# Patient Record
Sex: Female | Born: 1935 | Race: Black or African American | Hispanic: No | Smoking: Never smoker
Health system: Southern US, Community
[De-identification: ages and names within clinical notes are randomized; demographics above are authoritative.]

## PROBLEM LIST (undated history)

## (undated) DIAGNOSIS — R0600 Dyspnea, unspecified: Secondary | ICD-10-CM

## (undated) DIAGNOSIS — E785 Hyperlipidemia, unspecified: Secondary | ICD-10-CM

## (undated) DIAGNOSIS — I10 Essential (primary) hypertension: Secondary | ICD-10-CM

## (undated) DIAGNOSIS — I350 Nonrheumatic aortic (valve) stenosis: Secondary | ICD-10-CM

## (undated) DIAGNOSIS — I509 Heart failure, unspecified: Secondary | ICD-10-CM

## (undated) DIAGNOSIS — I4891 Unspecified atrial fibrillation: Secondary | ICD-10-CM

## (undated) DIAGNOSIS — Z0000 Encounter for general adult medical examination without abnormal findings: Secondary | ICD-10-CM

## (undated) HISTORY — DX: Essential (primary) hypertension: I10

## (undated) HISTORY — DX: Nonrheumatic aortic (valve) stenosis: I35.0

## (undated) HISTORY — DX: Encounter for general adult medical examination without abnormal findings: Z00.00

## (undated) HISTORY — DX: Hyperlipidemia, unspecified: E78.5

## (undated) HISTORY — DX: Unspecified atrial fibrillation: I48.91

---

## 1997-06-20 ENCOUNTER — Encounter: Admission: RE | Admit: 1997-06-20 | Discharge: 1997-06-20 | Payer: Self-pay | Admitting: Internal Medicine

## 1997-09-16 ENCOUNTER — Encounter: Admission: RE | Admit: 1997-09-16 | Discharge: 1997-09-16 | Payer: Self-pay | Admitting: Internal Medicine

## 1998-01-12 ENCOUNTER — Encounter: Admission: RE | Admit: 1998-01-12 | Discharge: 1998-01-12 | Payer: Self-pay | Admitting: Internal Medicine

## 1998-07-23 ENCOUNTER — Encounter: Admission: RE | Admit: 1998-07-23 | Discharge: 1998-07-23 | Payer: Self-pay | Admitting: Internal Medicine

## 1999-07-12 ENCOUNTER — Encounter: Admission: RE | Admit: 1999-07-12 | Discharge: 1999-07-12 | Payer: Self-pay | Admitting: Internal Medicine

## 2000-04-17 ENCOUNTER — Encounter: Admission: RE | Admit: 2000-04-17 | Discharge: 2000-04-17 | Payer: Self-pay | Admitting: Internal Medicine

## 2001-07-17 ENCOUNTER — Encounter: Admission: RE | Admit: 2001-07-17 | Discharge: 2001-07-17 | Payer: Self-pay | Admitting: *Deleted

## 2002-06-27 ENCOUNTER — Encounter: Admission: RE | Admit: 2002-06-27 | Discharge: 2002-06-27 | Payer: Self-pay | Admitting: Internal Medicine

## 2002-07-09 ENCOUNTER — Encounter: Admission: RE | Admit: 2002-07-09 | Discharge: 2002-07-09 | Payer: Self-pay | Admitting: Internal Medicine

## 2002-10-10 ENCOUNTER — Encounter: Admission: RE | Admit: 2002-10-10 | Discharge: 2002-10-10 | Payer: Self-pay | Admitting: Internal Medicine

## 2003-09-17 ENCOUNTER — Encounter: Admission: RE | Admit: 2003-09-17 | Discharge: 2003-09-17 | Payer: Self-pay | Admitting: Internal Medicine

## 2003-11-21 ENCOUNTER — Ambulatory Visit: Payer: Self-pay | Admitting: Internal Medicine

## 2004-08-20 ENCOUNTER — Ambulatory Visit: Payer: Self-pay | Admitting: Internal Medicine

## 2004-09-03 ENCOUNTER — Ambulatory Visit: Payer: Self-pay | Admitting: Internal Medicine

## 2004-09-10 ENCOUNTER — Ambulatory Visit: Payer: Self-pay | Admitting: Internal Medicine

## 2006-07-11 ENCOUNTER — Emergency Department (HOSPITAL_COMMUNITY): Admission: EM | Admit: 2006-07-11 | Discharge: 2006-07-11 | Payer: Self-pay | Admitting: Emergency Medicine

## 2006-07-18 ENCOUNTER — Ambulatory Visit: Payer: Self-pay | Admitting: Internal Medicine

## 2006-07-18 ENCOUNTER — Encounter (INDEPENDENT_AMBULATORY_CARE_PROVIDER_SITE_OTHER): Payer: Self-pay | Admitting: Dermatology

## 2006-07-18 DIAGNOSIS — I119 Hypertensive heart disease without heart failure: Secondary | ICD-10-CM | POA: Insufficient documentation

## 2006-07-26 ENCOUNTER — Encounter (INDEPENDENT_AMBULATORY_CARE_PROVIDER_SITE_OTHER): Payer: Self-pay | Admitting: Dermatology

## 2006-07-26 ENCOUNTER — Ambulatory Visit: Payer: Self-pay | Admitting: Internal Medicine

## 2006-07-26 DIAGNOSIS — B0229 Other postherpetic nervous system involvement: Secondary | ICD-10-CM

## 2006-08-02 ENCOUNTER — Ambulatory Visit: Payer: Self-pay | Admitting: Internal Medicine

## 2006-08-07 ENCOUNTER — Encounter (INDEPENDENT_AMBULATORY_CARE_PROVIDER_SITE_OTHER): Payer: Self-pay | Admitting: Dermatology

## 2006-08-17 ENCOUNTER — Encounter (INDEPENDENT_AMBULATORY_CARE_PROVIDER_SITE_OTHER): Payer: Self-pay | Admitting: Internal Medicine

## 2006-08-17 ENCOUNTER — Ambulatory Visit: Payer: Self-pay | Admitting: Internal Medicine

## 2007-02-20 ENCOUNTER — Telehealth (INDEPENDENT_AMBULATORY_CARE_PROVIDER_SITE_OTHER): Payer: Self-pay | Admitting: *Deleted

## 2007-02-22 ENCOUNTER — Encounter (INDEPENDENT_AMBULATORY_CARE_PROVIDER_SITE_OTHER): Payer: Self-pay | Admitting: *Deleted

## 2007-02-22 ENCOUNTER — Ambulatory Visit: Payer: Self-pay | Admitting: Internal Medicine

## 2007-02-22 LAB — CONVERTED CEMR LAB
ALT: 18 units/L (ref 0–35)
AST: 21 units/L (ref 0–37)
Albumin: 4.1 g/dL (ref 3.5–5.2)
Alkaline Phosphatase: 62 units/L (ref 39–117)
Calcium: 9.2 mg/dL (ref 8.4–10.5)
Chloride: 102 meq/L (ref 96–112)
Potassium: 4.3 meq/L (ref 3.5–5.3)

## 2007-03-08 ENCOUNTER — Ambulatory Visit: Payer: Self-pay | Admitting: Infectious Disease

## 2007-03-08 DIAGNOSIS — I10 Essential (primary) hypertension: Secondary | ICD-10-CM | POA: Insufficient documentation

## 2007-03-13 ENCOUNTER — Encounter (INDEPENDENT_AMBULATORY_CARE_PROVIDER_SITE_OTHER): Payer: Self-pay | Admitting: *Deleted

## 2007-11-06 ENCOUNTER — Telehealth: Payer: Self-pay | Admitting: *Deleted

## 2008-01-22 ENCOUNTER — Ambulatory Visit: Payer: Self-pay | Admitting: *Deleted

## 2008-01-22 LAB — CONVERTED CEMR LAB
ALT: 18 units/L (ref 0–35)
AST: 19 units/L (ref 0–37)
BUN: 23 mg/dL (ref 6–23)
CO2: 24 meq/L (ref 19–32)
Calcium: 9.3 mg/dL (ref 8.4–10.5)
Chloride: 99 meq/L (ref 96–112)
Cholesterol: 248 mg/dL — ABNORMAL HIGH (ref 0–200)
Creatinine, Ser: 0.87 mg/dL (ref 0.40–1.20)
HDL: 76 mg/dL (ref >39)
Total Bilirubin: 0.4 mg/dL (ref 0.3–1.2)
Total CHOL/HDL Ratio: 3.3
VLDL: 14 mg/dL (ref 0–40)

## 2008-12-05 ENCOUNTER — Telehealth (INDEPENDENT_AMBULATORY_CARE_PROVIDER_SITE_OTHER): Payer: Self-pay | Admitting: *Deleted

## 2009-02-03 ENCOUNTER — Telehealth (INDEPENDENT_AMBULATORY_CARE_PROVIDER_SITE_OTHER): Payer: Self-pay | Admitting: *Deleted

## 2011-10-12 ENCOUNTER — Ambulatory Visit (INDEPENDENT_AMBULATORY_CARE_PROVIDER_SITE_OTHER): Payer: Medicare Other | Admitting: Internal Medicine

## 2011-10-12 ENCOUNTER — Encounter: Payer: Self-pay | Admitting: Internal Medicine

## 2011-10-12 VITALS — BP 129/76 | HR 86 | Temp 96.6°F | Ht 63.0 in | Wt 155.1 lb

## 2011-10-12 DIAGNOSIS — R634 Abnormal weight loss: Secondary | ICD-10-CM

## 2011-10-12 DIAGNOSIS — N939 Abnormal uterine and vaginal bleeding, unspecified: Secondary | ICD-10-CM

## 2011-10-12 DIAGNOSIS — N898 Other specified noninflammatory disorders of vagina: Secondary | ICD-10-CM

## 2011-10-12 NOTE — Patient Instructions (Addendum)
1.  Keep an eye out for the bleeding.  If it comes back please call us back so we can see you while you are bleeding to try to find the source.  2.  Follow up with Dr. Katrinka Blazing to see if he thinks you need to go back on the coumadin.  3.   Continue your medications as prescribed

## 2011-10-12 NOTE — Progress Notes (Signed)
Subjective:   Patient ID: Jean Moore female   DOB: 11-Jul-1926 78 y.o.   MRN: [id number]  HPI: JeanJean Moore is a 78 y.o. woman who was referred to our office by Dr. Verdis Prime for abnormal vaginal bleeding.   She states that she noted vaginal bleeding that started 8/29 and stopped 9/3.  She states that it was like when she used to have a menstrual cycle.  She states that she was going through 2 pads per day.  She denies pain, vaginal discharge, constipation, lightheadedness, palpitations, shortness of breath, or abdominal pain.  She has been on coumadin for over a year for atrial fibrillation.  She states that she noted the blood in the bowl of the toilet and had occasional clots.    She states that has gone down "a few sizes" in her clothes.  She used to be a size 18 and is not down to a size 12 or 14.  She notes that this is over the last year.  In chart review she haslost 50 lbs since she was seen in the Epic system in 2010.  She denies night sweats, nausea, vomiting, lymphadenopathy, constipation, diarrhea, stool changes, abdominal pain, or chest pain.  She is a never smoker.    Past Medical History  Diagnosis Date  . Atrial fibrillation     on coumadin since 2012, recently stopped because of vaginal bleeding  . Hypertension   . Hyperlipidemia    Current Outpatient Prescriptions  Medication Sig Dispense Refill  . digoxin (LANOXIN) 0.125 MG tablet Take 0.125 mg by mouth daily.      . metoprolol succinate (TOPROL-XL) 100 MG 24 hr tablet Take 100 mg by mouth daily. Take with or immediately following a meal.      . Multiple Vitamins-Minerals (CENTRUM SILVER ADULT 50+ PO) Take 1 tablet by mouth daily.       No family history on file. History   Social History  . Marital Status: Widowed    Spouse Name: N/A    Number of Children: N/A  . Years of Education: N/A   Social History Main Topics  . Smoking status: Never Smoker   . Smokeless tobacco: None  . Alcohol Use: None  . Drug  Use: None  . Sexually Active: None   Other Topics Concern  . None   Social History Narrative  . None   Review of Systems: Constitutional: Denies fever, chills, diaphoresis, appetite change and fatigue.  HEENT: Denies photophobia, eye pain, redness, hearing loss, ear pain, congestion, sore throat, rhinorrhea, sneezing, mouth sores, trouble swallowing, neck pain, neck stiffness and tinnitus.   Respiratory: Denies SOB, DOE, cough, chest tightness,  and wheezing.   Cardiovascular: Denies chest pain, palpitations and leg swelling.  Gastrointestinal: Denies nausea, vomiting, abdominal pain, diarrhea, constipation, blood in stool and abdominal distention.  Genitourinary: Positive for vaginal bleeding.  Denies dysuria, urgency, frequency, hematuria, flank pain and difficulty urinating.  Musculoskeletal: Denies myalgias, back pain, joint swelling, arthralgias and gait problem.  Skin: Denies pallor, rash and wound.  Neurological: Denies dizziness, seizures, syncope, weakness, light-headedness, numbness and headaches.  Hematological: Denies adenopathy. Easy bruising, personal or family bleeding history  Psychiatric/Behavioral: Denies suicidal ideation, mood changes, confusion, nervousness, sleep disturbance and agitation  Objective:  Physical Exam: Filed Vitals:   10/12/11 1350  BP: 129/76  Pulse: 86  Temp: 96.6 F (35.9 C)  TempSrc: Oral  Height: 5\' 3"  (1.6 m)  Weight: 155 lb 1.6 oz (70.353 kg)  SpO2: 99%  Constitutional: Vital signs reviewed.  Patient is a thin elderly woman in no acute distress and cooperative with exam. Alert and oriented x3.  Head: Normocephalic and atraumatic Ear: TM normal bilaterally Mouth: no erythema or exudates, MMM Eyes: PERRL, EOMI, conjunctivae normal, No scleral icterus.  Neck: Supple, Trachea midline normal ROM, No JVD, mass, thyromegaly, or carotid bruit present.  Cardiovascular: irregularly irregular rate and rhythm.  S1 normal, S2 normal, no MRG,  pulses symmetric and intact bilaterally Pulmonary/Chest: CTAB, no wheezes, rales, or rhonchi Abdominal: Soft. Non-tender, non-distended, bowel sounds are normal, no masses, organomegaly, or guarding present.  GU: no CVA tenderness, no axillary, inguinal, or neck lymphadenopathy. Vaginal and rectal exam refused. Musculoskeletal: No joint deformities, erythema, or stiffness, ROM full and no nontender Hematology: no cervical, inginal, or axillary adenopathy.  Neurological: A&O x3, Strength is normal and symmetric bilaterally, cranial nerve II-XII are grossly intact, no focal motor deficit, sensory intact to light touch bilaterally.  Skin: Warm, dry and intact. No rash, cyanosis, or clubbing.  Psychiatric: Normal mood and affect. speech and behavior is normal. Judgment and thought content normal. Cognition and memory are normal.   Assessment & Plan:

## 2012-01-02 DIAGNOSIS — N939 Abnormal uterine and vaginal bleeding, unspecified: Secondary | ICD-10-CM | POA: Insufficient documentation

## 2012-01-02 DIAGNOSIS — R634 Abnormal weight loss: Secondary | ICD-10-CM | POA: Insufficient documentation

## 2012-01-02 NOTE — Assessment & Plan Note (Signed)
Jean Moore presents complaining of abnormal vaginal bleeding.  She has been menopausal for at least 30 years and states that this started out of the blue.  She has no symptoms of anemia from the bleeding.  There is, obviously great concern for carcinoma of the uterus, cervix, or vagina.  With her description of the blood in the bowl it may even be coming from her rectum.  When I discussed my concerns with her she declined both an vaginal exam as well as a rectal exam for blood.  We discussed that this may be a warning sign of something more ominous but she continued to refuse.  We discussed the risks including continued symptomatic anemia, spread of cancer, or even death as well as the benefits including detection of occult malignancy, diagnosis of a benign disease, and limiting the possibility of symptomatic anemia.  She states that she understands and will return if the symptoms recur.

## 2012-01-02 NOTE — Assessment & Plan Note (Signed)
Wt Readings from Last 3 Encounters:  10/12/11 155 lb 1.6 oz (70.353 kg)  01/22/08 202 lb (91.627 kg)  03/08/07 202 lb 11.2 oz (91.944 kg)   She has a documented 50 lb weight loss over the last 4 years in the Epic system.  She states that over the last year she has noted that her weight has dropped considerably and her clothing size has shrunk.  She is currently refusing all work up for this weight loss.  This coupled with her vaginal bleeding raise great concern for occult malignancy.  She states that she will return in the symptoms recur.  I encouraged her to keep a food diary and track her weight more carefully to see if she continues to lose weight.

## 2012-03-05 ENCOUNTER — Encounter: Payer: Self-pay | Admitting: Internal Medicine

## 2012-04-09 ENCOUNTER — Encounter: Payer: Self-pay | Admitting: Internal Medicine

## 2012-04-09 ENCOUNTER — Ambulatory Visit (INDEPENDENT_AMBULATORY_CARE_PROVIDER_SITE_OTHER): Payer: Medicare Other | Admitting: Internal Medicine

## 2012-04-09 VITALS — BP 119/76 | HR 93 | Temp 96.9°F | Ht 63.0 in | Wt 152.0 lb

## 2012-04-09 DIAGNOSIS — I480 Paroxysmal atrial fibrillation: Secondary | ICD-10-CM | POA: Insufficient documentation

## 2012-04-09 DIAGNOSIS — Z0000 Encounter for general adult medical examination without abnormal findings: Secondary | ICD-10-CM

## 2012-04-09 DIAGNOSIS — I5023 Acute on chronic systolic (congestive) heart failure: Secondary | ICD-10-CM | POA: Insufficient documentation

## 2012-04-09 DIAGNOSIS — L738 Other specified follicular disorders: Secondary | ICD-10-CM

## 2012-04-09 DIAGNOSIS — L853 Xerosis cutis: Secondary | ICD-10-CM | POA: Insufficient documentation

## 2012-04-09 DIAGNOSIS — I509 Heart failure, unspecified: Secondary | ICD-10-CM

## 2012-04-09 DIAGNOSIS — I5022 Chronic systolic (congestive) heart failure: Secondary | ICD-10-CM

## 2012-04-09 DIAGNOSIS — I4891 Unspecified atrial fibrillation: Secondary | ICD-10-CM

## 2012-04-09 DIAGNOSIS — R634 Abnormal weight loss: Secondary | ICD-10-CM

## 2012-04-09 DIAGNOSIS — I10 Essential (primary) hypertension: Secondary | ICD-10-CM

## 2012-04-09 HISTORY — DX: Encounter for general adult medical examination without abnormal findings: Z00.00

## 2012-04-09 NOTE — Assessment & Plan Note (Signed)
She follows with Dr. Katrinka Blazing in Cardiology.  It appears she uses his office as more of her PCP and all of her medications are prescribed through that office.  She currently is asymptomatic and does not have any complaints of volume overload, orthopnea, PND. She has no edema on exam.   She is on an aspirin, lisinopril, hctz and metoprolol.  She is not on a statin.  Will attempt to get more up to date records from Dr. Michaelle Copas office.

## 2012-04-09 NOTE — Assessment & Plan Note (Signed)
She does well with lotion.  I advised continued use of vaseline, eucerin and addition of oatmeal baths.

## 2012-04-09 NOTE — Assessment & Plan Note (Addendum)
BP was 160/91 when she arrived, recheck was 119/76.  She is doing well on current therapy.  She follows with Dr. Katrinka Blazing in Cardiology.   BP Readings from Last 3 Encounters:  04/09/12 119/76  10/12/11 129/76  01/22/08 128/83    Lab Results  Component Value Date   NA 137 01/22/2008   K 4.4 01/22/2008   CREATININE 0.87 01/22/2008    Assessment:  Blood pressure control: controlled  Progress toward BP goal:  at goal  Comments: attempt to get more recent labs from Dr. Katrinka Blazing in Cardiology  Plan:  Medications:  continue current medications  Educational resources provided: other (see comments) (Booklet.)  Self management tools provided: other (see comments) (7 Steps to a Healthy Heart.)  Other plans: none

## 2012-04-09 NOTE — Assessment & Plan Note (Signed)
Rate on first presenting to clinic was 102, however, this improved to 93.  She is taking digoxin and metoprolol.   She was previously on coumadin, but this caused vaginal bleeding.  Since being off the medication she is doing well, no further bleeding.  Attempt to get recent labs from Dr. Michaelle Copas office.

## 2012-04-09 NOTE — Patient Instructions (Addendum)
Please schedule a follow up visit within the next 1 year, sooner if needed.   For your medications:   Please bring all of your pill  Bottles with you to each visit.  This will help make sure that we have an up to date list of all the medications you are taking.  Please also bring any over the counter herbal medications you are taking (not including advil, tylenol, etc.)  Please continue taking all if your medications as prescribed.   Please continue to follow with Dr. Katrinka Blazing for your Heart Failure and High blood pressure.    Thank you!

## 2012-04-09 NOTE — Assessment & Plan Note (Signed)
Patient has refused all Health Maintenance including vaccinations.

## 2012-04-09 NOTE — Assessment & Plan Note (Signed)
Stable, her weight loss was apparently intentional.

## 2012-04-09 NOTE — Progress Notes (Signed)
  Subjective:    Patient ID: Jean Moore, female    DOB: 09-19-1926, 79 y.o.   MRN: [id number]  CC: routine follow up, no complaints today  HPI  Jean Moore is an 79yo woman with PMH of HTN, Chronic systolic heart failure with EF of 15-20%, Atrial fibrillation who presents for routine follow up.   She is followed also by Dr. Verdis Prime in Cardiology who appears to managing her chronic issues above.  She is taking medications for afib, heart failure and HTN from this provider.   She was previously seen here for acute vaginal bleeding.  However, this appears to have been associated with coumadin which she was taking for prophylaxis due to afib.  She is no longer on this medication and has had no further issues. She was also previously on a medication from Dr. Katrinka Blazing for curbing her appetite.  She attributes this medication to her weight loss as noted previously.  Her goal is to remain around 150 pounds as recommended by Dr. Katrinka Blazing.   She currently has no complaints and is only concerned about chronic dry skin.  She is already using vaseline and eucerin, which I encouraged her to continue using.  I also recommended putting lotion on her feet followed by socks to help maintain moisture and oatmeal baths as an addition to her regimen.    She is not interested in vaccines or age appropriate screening; she understands the risks of not having these performed.    Her medications have been updated in epic and include: lisinopril-hctz, aspirin, tylenol prn, metoprolol, digoxin, multi vitamin  Review of Systems  Constitutional: Negative for activity change, appetite change and fatigue.  HENT: Negative for hearing loss and sore throat.   Eyes: Negative for visual disturbance.  Respiratory: Negative for cough, chest tightness and shortness of breath.   Cardiovascular: Negative for chest pain and leg swelling.  Gastrointestinal: Negative for nausea, diarrhea, constipation and abdominal distention.   Genitourinary: Negative for dysuria and difficulty urinating.  Musculoskeletal: Negative for back pain and arthralgias.  Skin: Negative for pallor, rash and wound.  Neurological: Negative for dizziness, syncope, light-headedness and headaches.  Psychiatric/Behavioral: Negative for behavioral problems, confusion and decreased concentration.       Objective:   Physical Exam  Constitutional: She is oriented to person, place, and time. She appears well-developed and well-nourished. No distress.  HENT:  Head: Normocephalic and atraumatic.  Eyes: Conjunctivae are normal. No scleral icterus.  Neck: Neck supple. No thyromegaly present.  Cardiovascular: Normal rate and intact distal pulses.  An irregularly irregular rhythm present.  Murmur heard. Pulmonary/Chest: Effort normal and breath sounds normal. No respiratory distress. She has no wheezes.  Abdominal: Soft. Bowel sounds are normal. There is no tenderness.  Musculoskeletal: She exhibits no edema.  Lymphadenopathy:    She has no cervical adenopathy.  Neurological: She is alert and oriented to person, place, and time.  Skin: Skin is warm and dry. No erythema.  Psychiatric: She has a normal mood and affect. Her behavior is normal.    No labs, attempt to get records from Dr. Michaelle Copas office      Assessment & Plan:  RTC in 1 year, sooner if needed

## 2012-05-29 LAB — PROTIME-INR

## 2012-11-30 ENCOUNTER — Telehealth: Payer: Self-pay | Admitting: Interventional Cardiology

## 2012-11-30 MED ORDER — LISINOPRIL-HYDROCHLOROTHIAZIDE 20-25 MG PO TABS: 1.0000 | ORAL_TABLET | Freq: Every day | ORAL | Status: DC

## 2012-11-30 NOTE — Telephone Encounter (Signed)
Done

## 2012-11-30 NOTE — Telephone Encounter (Signed)
New message    [facility] in [place]----refill bp medication---pt does not know name of rx

## 2012-12-04 ENCOUNTER — Other Ambulatory Visit: Payer: Self-pay

## 2012-12-04 MED ORDER — METOPROLOL SUCCINATE ER 100 MG PO TB24: 100.0000 mg | ORAL_TABLET | Freq: Every day | ORAL | Status: DC

## 2013-02-26 ENCOUNTER — Telehealth: Payer: Self-pay | Admitting: Interventional Cardiology

## 2013-02-26 NOTE — Telephone Encounter (Signed)
New message     Refill hctz and metoprolol 100mg --pt want a 90day supply----[facility]--pt has not been seen at the new office

## 2013-02-27 MED ORDER — LISINOPRIL-HYDROCHLOROTHIAZIDE 20-25 MG PO TABS: 1.0000 | ORAL_TABLET | Freq: Every day | ORAL | Status: DC

## 2013-02-27 NOTE — Telephone Encounter (Signed)
Done

## 2013-04-26 ENCOUNTER — Emergency Department (INDEPENDENT_AMBULATORY_CARE_PROVIDER_SITE_OTHER)
Admission: EM | Admit: 2013-04-26 | Discharge: 2013-04-26 | Disposition: A | Discharge status: Nursing Home (Includes ICF) | Payer: Medicaid Other | Source: Home / Self Care | Attending: Family Medicine | Admitting: Family Medicine

## 2013-04-26 ENCOUNTER — Encounter (HOSPITAL_COMMUNITY): Payer: Self-pay | Admitting: Emergency Medicine

## 2013-04-26 ENCOUNTER — Emergency Department (HOSPITAL_COMMUNITY): Payer: Medicare Other

## 2013-04-26 DIAGNOSIS — E876 Hypokalemia: Secondary | ICD-10-CM | POA: Diagnosis not present

## 2013-04-26 DIAGNOSIS — I079 Rheumatic tricuspid valve disease, unspecified: Secondary | ICD-10-CM | POA: Diagnosis present

## 2013-04-26 DIAGNOSIS — E785 Hyperlipidemia, unspecified: Secondary | ICD-10-CM | POA: Diagnosis present

## 2013-04-26 DIAGNOSIS — R600 Localized edema: Secondary | ICD-10-CM

## 2013-04-26 DIAGNOSIS — I10 Essential (primary) hypertension: Secondary | ICD-10-CM | POA: Diagnosis present

## 2013-04-26 DIAGNOSIS — R609 Edema, unspecified: Secondary | ICD-10-CM

## 2013-04-26 DIAGNOSIS — I509 Heart failure, unspecified: Secondary | ICD-10-CM | POA: Diagnosis present

## 2013-04-26 DIAGNOSIS — I5023 Acute on chronic systolic (congestive) heart failure: Principal | ICD-10-CM | POA: Diagnosis present

## 2013-04-26 DIAGNOSIS — Z91013 Allergy to seafood: Secondary | ICD-10-CM

## 2013-04-26 DIAGNOSIS — I447 Left bundle-branch block, unspecified: Secondary | ICD-10-CM | POA: Diagnosis present

## 2013-04-26 DIAGNOSIS — I2789 Other specified pulmonary heart diseases: Secondary | ICD-10-CM | POA: Diagnosis present

## 2013-04-26 DIAGNOSIS — I080 Rheumatic disorders of both mitral and aortic valves: Secondary | ICD-10-CM | POA: Diagnosis present

## 2013-04-26 DIAGNOSIS — R9431 Abnormal electrocardiogram [ECG] [EKG]: Secondary | ICD-10-CM

## 2013-04-26 DIAGNOSIS — I4891 Unspecified atrial fibrillation: Secondary | ICD-10-CM | POA: Diagnosis present

## 2013-04-26 LAB — CBC WITH DIFFERENTIAL/PLATELET
BASOS ABS: 0 10*3/uL (ref 0.0–0.1)
Basophils Relative: 0 % (ref 0–1)
EOS PCT: 2 % (ref 0–5)
Eosinophils Absolute: 0.1 10*3/uL (ref 0.0–0.7)
HCT: 39.3 % (ref 36.0–46.0)
Hemoglobin: 13.2 g/dL (ref 12.0–15.0)
Lymphocytes Relative: 48 % — ABNORMAL HIGH (ref 12–46)
Lymphs Abs: 2.6 10*3/uL (ref 0.7–4.0)
MCH: 28.9 pg (ref 26.0–34.0)
MCHC: 33.6 g/dL (ref 30.0–36.0)
MCV: 86.2 fL (ref 78.0–100.0)
MONO ABS: 0.4 10*3/uL (ref 0.1–1.0)
Monocytes Relative: 7 % (ref 3–12)
Neutro Abs: 2.3 10*3/uL (ref 1.7–7.7)
Neutrophils Relative %: 43 % (ref 43–77)
PLATELETS: 153 10*3/uL (ref 150–400)
RBC: 4.56 MIL/uL (ref 3.87–5.11)
RDW: 13.9 % (ref 11.5–15.5)
WBC: 5.4 10*3/uL (ref 4.0–10.5)

## 2013-04-26 LAB — PRO B NATRIURETIC PEPTIDE: PRO B NATRI PEPTIDE: 7233 pg/mL — AB (ref 0–450)

## 2013-04-26 LAB — COMPREHENSIVE METABOLIC PANEL
ALT: 26 U/L (ref 0–35)
AST: 27 U/L (ref 0–37)
Albumin: 2.8 g/dL — ABNORMAL LOW (ref 3.5–5.2)
Alkaline Phosphatase: 47 U/L (ref 39–117)
BUN: 21 mg/dL (ref 6–23)
CO2: 29 mEq/L (ref 19–32)
CREATININE: 0.79 mg/dL (ref 0.50–1.10)
Calcium: 9 mg/dL (ref 8.4–10.5)
Chloride: 95 mEq/L — ABNORMAL LOW (ref 96–112)
GFR calc Af Amer: 86 mL/min — ABNORMAL LOW (ref >90)
GFR calc non Af Amer: 74 mL/min — ABNORMAL LOW (ref >90)
Glucose, Bld: 117 mg/dL — ABNORMAL HIGH (ref 70–99)
Potassium: 4.3 mEq/L (ref 3.7–5.3)
SODIUM: 134 meq/L — AB (ref 137–147)
TOTAL PROTEIN: 6.3 g/dL (ref 6.0–8.3)
Total Bilirubin: 0.5 mg/dL (ref 0.3–1.2)

## 2013-04-26 LAB — TROPONIN I: Troponin I: <0.3 ng/mL (ref <0.30)

## 2013-04-26 NOTE — ED Notes (Signed)
Pt. assisted to shuttle by me. Walking well with cane. Tol. well.

## 2013-04-26 NOTE — ED Notes (Signed)
C/o swelling both legs from knees down onset Sunday.  Has not had  this before except when she travels in a car a long way.

## 2013-04-26 NOTE — ED Notes (Signed)
The pt has had some sob  With feet and legs swelling for 7 days.  She has also had an irregular heart rate.  [facility]g at [facility] and she was sent down here for treatemnt

## 2013-04-26 NOTE — ED Provider Notes (Signed)
CSN: [id number]     Arrival date & time 04/26/13  1857 History   None    No chief complaint on file.  (Consider location/radiation/quality/duration/timing/severity/associated sxs/prior Treatment) Patient is a 80 y.o. female presenting with leg pain. The history is provided by the patient. No language interpreter was used.  Leg Pain Location:  Leg Time since incident:  1 week Injury: no   Leg location:  L leg and R leg Pain details:    Quality:  Aching   Radiates to:  Does not radiate   Severity:  Moderate   Timing:  Constant   Progression:  Worsening Chronicity:  New Dislocation: no   Foreign body present:  No foreign bodies Worsened by:  Nothing tried Ineffective treatments:  None tried Risk factors: no recent illness   Pt complains of both legs swelling.   Pt reports legs have not swelled like this before.     Past Medical History  Diagnosis Date  . Atrial fibrillation     on coumadin since 2012, recently stopped because of vaginal bleeding  . Hypertension   . Hyperlipidemia   . Routine adult health maintenance 04/09/2012   No past surgical history on file. No family history on file. History  Substance Use Topics  . Smoking status: Never Smoker   . Smokeless tobacco: Not on file  . Alcohol Use: Not on file   OB History   Grav Para Term Preterm Abortions TAB SAB Ect Mult Living                 Review of Systems  All other systems reviewed and are negative.    Allergies  Shrimp  Home Medications   Current Outpatient Rx  Name  Route  Sig  Dispense  Refill  . acetaminophen (TYLENOL) 500 MG tablet   Oral   Take 500 mg by mouth every 6 (six) hours as needed for pain.         Marland Kitchen. aspirin 81 MG tablet   Oral   Take 81 mg by mouth daily.         . digoxin (LANOXIN) 0.125 MG tablet   Oral   Take 0.125 mg by mouth daily.         Marland Kitchen. lisinopril-hydrochlorothiazide (PRINZIDE,ZESTORETIC) 20-25 MG per tablet   Oral   Take 1 tablet by mouth daily.   90  tablet   2   . metoprolol succinate (TOPROL-XL) 100 MG 24 hr tablet   Oral   Take 1 tablet (100 mg total) by mouth daily. Take with or immediately following a meal.   30 tablet   5   . Multiple Vitamins-Minerals (CENTRUM SILVER ADULT 50+ PO)   Oral   Take 1 tablet by mouth daily.          BP 117/78  Pulse 90  Temp(Src) 97.6 F (36.4 C) (Oral)  Resp 20  SpO2 98%  LMP 10/12/1978 Physical Exam  Nursing note and vitals reviewed. Constitutional: She is oriented to person, place, and time. She appears well-developed and well-nourished.  HENT:  Head: Normocephalic and atraumatic.  Eyes: Conjunctivae and EOM are normal. Pupils are equal, round, and reactive to light.  Neck: Normal range of motion.  Cardiovascular: Normal rate.   irregular  Pulmonary/Chest: Effort normal and breath sounds normal.  Abdominal: Soft. She exhibits no distension.  Musculoskeletal: Normal range of motion.  Neurological: She is alert and oriented to person, place, and time.  Skin: Skin is warm.  Psychiatric: She  has a normal mood and affect.    ED Course  Procedures (including critical care time) Labs Review Labs Reviewed - No data to display Imaging Review No results found.   MDM EKg changes since 05/2012.   No acute   i suspect Chf.    I discussed with family and pt.   They agree to ED evaluation   1. Edema extremities   2. Abnormal EKG      Elson Areas, [place] 04/26/13 2058

## 2013-04-27 ENCOUNTER — Encounter (HOSPITAL_COMMUNITY): Payer: Self-pay | Admitting: Nurse Practitioner

## 2013-04-27 ENCOUNTER — Inpatient Hospital Stay (HOSPITAL_COMMUNITY)
Admission: EM | Admit: 2013-04-27 | Discharge: 2013-04-29 | DRG: 293 | Disposition: A | Discharge status: Home / Self Care | Payer: Medicare Other | Attending: Internal Medicine | Admitting: Internal Medicine

## 2013-04-27 DIAGNOSIS — I480 Paroxysmal atrial fibrillation: Secondary | ICD-10-CM | POA: Diagnosis present

## 2013-04-27 DIAGNOSIS — R0989 Other specified symptoms and signs involving the circulatory and respiratory systems: Secondary | ICD-10-CM

## 2013-04-27 DIAGNOSIS — M7989 Other specified soft tissue disorders: Secondary | ICD-10-CM

## 2013-04-27 DIAGNOSIS — R0609 Other forms of dyspnea: Secondary | ICD-10-CM

## 2013-04-27 DIAGNOSIS — I10 Essential (primary) hypertension: Secondary | ICD-10-CM

## 2013-04-27 DIAGNOSIS — I509 Heart failure, unspecified: Secondary | ICD-10-CM | POA: Diagnosis present

## 2013-04-27 DIAGNOSIS — I5023 Acute on chronic systolic (congestive) heart failure: Principal | ICD-10-CM

## 2013-04-27 DIAGNOSIS — I4891 Unspecified atrial fibrillation: Secondary | ICD-10-CM

## 2013-04-27 DIAGNOSIS — I319 Disease of pericardium, unspecified: Secondary | ICD-10-CM

## 2013-04-27 DIAGNOSIS — E785 Hyperlipidemia, unspecified: Secondary | ICD-10-CM

## 2013-04-27 HISTORY — DX: Heart failure, unspecified: I50.9

## 2013-04-27 LAB — BASIC METABOLIC PANEL
BUN: 19 mg/dL (ref 6–23)
CHLORIDE: 92 meq/L — AB (ref 96–112)
CO2: 33 mEq/L — ABNORMAL HIGH (ref 19–32)
Calcium: 8.8 mg/dL (ref 8.4–10.5)
Creatinine, Ser: 0.82 mg/dL (ref 0.50–1.10)
GFR calc Af Amer: 74 mL/min — ABNORMAL LOW (ref >90)
GFR, EST NON AFRICAN AMERICAN: 64 mL/min — AB (ref >90)
Glucose, Bld: 179 mg/dL — ABNORMAL HIGH (ref 70–99)
Potassium: 3.7 mEq/L (ref 3.7–5.3)
SODIUM: 136 meq/L — AB (ref 137–147)

## 2013-04-27 LAB — TROPONIN I
TROPONIN I: 0.32 ng/mL — AB (ref <0.30)
TROPONIN I: 0.3 ng/mL — AB (ref <0.30)
Troponin I: 0.32 ng/mL (ref <0.30)

## 2013-04-27 LAB — DIGOXIN LEVEL: Digoxin Level: <0.3 ng/mL — ABNORMAL LOW (ref 0.8–2.0)

## 2013-04-27 LAB — TSH: TSH: 0.838 u[IU]/mL (ref 0.350–4.500)

## 2013-04-27 LAB — MAGNESIUM: Magnesium: 1.9 mg/dL (ref 1.5–2.5)

## 2013-04-27 MED ORDER — METOPROLOL SUCCINATE ER 100 MG PO TB24
100.0000 mg | ORAL_TABLET | Freq: Every day | ORAL | Status: DC
  Administered 2013-04-29: 100 mg via ORAL
  Filled 2013-04-27 (×3): qty 1

## 2013-04-27 MED ORDER — DIGOXIN 125 MCG PO TABS
0.1250 mg | ORAL_TABLET | Freq: Every day | ORAL | Status: DC
  Administered 2013-04-27 – 2013-04-29 (×3): 0.125 mg via ORAL
  Filled 2013-04-27 (×3): qty 1

## 2013-04-27 MED ORDER — HEPARIN SODIUM (PORCINE) 5000 UNIT/ML IJ SOLN
5000.0000 [IU] | Freq: Three times a day (TID) | INTRAMUSCULAR | Status: DC
  Administered 2013-04-27 – 2013-04-29 (×6): 5000 [IU] via SUBCUTANEOUS
  Filled 2013-04-27 (×9): qty 1

## 2013-04-27 MED ORDER — SODIUM CHLORIDE 0.9 % IJ SOLN
3.0000 mL | Freq: Two times a day (BID) | INTRAMUSCULAR | Status: DC
  Administered 2013-04-27 – 2013-04-28 (×5): 3 mL via INTRAVENOUS

## 2013-04-27 MED ORDER — ASPIRIN EC 81 MG PO TBEC
81.0000 mg | DELAYED_RELEASE_TABLET | Freq: Every day | ORAL | Status: DC
  Administered 2013-04-27 – 2013-04-29 (×3): 81 mg via ORAL
  Filled 2013-04-27 (×3): qty 1

## 2013-04-27 MED ORDER — SODIUM CHLORIDE 0.9 % IJ SOLN: 3.0000 mL | INTRAMUSCULAR | Status: DC | PRN

## 2013-04-27 MED ORDER — FUROSEMIDE 10 MG/ML IJ SOLN
40.0000 mg | Freq: Two times a day (BID) | INTRAMUSCULAR | Status: DC
  Administered 2013-04-27 – 2013-04-28 (×2): 40 mg via INTRAVENOUS
  Filled 2013-04-27 (×2): qty 4

## 2013-04-27 MED ORDER — FUROSEMIDE 10 MG/ML IJ SOLN: 40.0000 mg | INTRAMUSCULAR | Status: AC

## 2013-04-27 MED ORDER — SODIUM CHLORIDE 0.9 % IJ SOLN: 3.0000 mL | Freq: Two times a day (BID) | INTRAMUSCULAR | Status: DC

## 2013-04-27 MED ORDER — SODIUM CHLORIDE 0.9 % IV SOLN: 250.0000 mL | INTRAVENOUS | Status: DC | PRN

## 2013-04-27 MED ORDER — FUROSEMIDE 10 MG/ML IJ SOLN
40.0000 mg | INTRAMUSCULAR | Status: AC
  Administered 2013-04-27: 40 mg via INTRAVENOUS
  Filled 2013-04-27: qty 4

## 2013-04-27 MED ORDER — FUROSEMIDE 10 MG/ML IJ SOLN
40.0000 mg | Freq: Four times a day (QID) | INTRAMUSCULAR | Status: DC
  Administered 2013-04-27: 40 mg via INTRAVENOUS
  Filled 2013-04-27 (×4): qty 4

## 2013-04-27 NOTE — ED Notes (Signed)
Pt reports she use to be on diuretics but has been off of them for at least one year.

## 2013-04-27 NOTE — H&P (Signed)
Date: 04/27/2013               Patient Name:  Jean Moore MRN: [id number]  DOB: 08/30/1926 Age / Sex: 80 y.o., female   PCP: Inez CatalinaEmily B Mullen, MD         Medical Service: Internal Medicine Teaching Service         Attending Physician: Dr. Burns SpainElizabeth A Butcher, MD    First Contact: Dr. Glendell DockerKomanski Pager: [phone]  Second Contact: Dr. Burtis JunesSadek Pager: [phone]       After Hours (After 5p/  First Contact Pager: [phone]  weekends / holidays): Second Contact Pager: [phone]   Chief Complaint: Bilateral LE swelling w/ DOE  History of Present Illness: [name] is a 80 y.o. female w/ PMHx of Atrial Fibrillation, HTN, HLD, presents to the ED from urgent care w/ complaints of recent progressive LE swelling. She claims this swelling started in the last week and has become so severe that she is having difficulty wearing her normal pair of shoes. She also notes a recent worsening DOE which began 2-3 weeks ago as well. She is very independent w/ ADL's and IADL's but says she has had trouble vacuuming her whole living room without having to stop to take a rest 2/2 her SOB. She claims she has never had these issues before. She also admits to recent 3 pillow orthopnea, but denies PND. The patient otherwise denies chest pain, dizziness, lightheadedness, palpitations, leg pain, recent surgeries, or prolonged immobilization. She also denies previous history of DVT or PE and no family history of clotting disorders. She also denies any fever, chills, nausea, vomiting, abdominal pain, diarrhea, or dysuria.   Meds: Current Facility-Administered Medications  Medication Dose Route Frequency Provider Last Rate Last Dose  . furosemide (LASIX) injection 40 mg  40 mg Intravenous STAT Vida RollerBrian D Miller, MD       Current Outpatient Prescriptions  Medication Sig Dispense Refill  . acetaminophen (TYLENOL) 500 MG tablet Take 500 mg by mouth every 6 (six) hours as needed for pain.      Marland Kitchen. aspirin 81 MG tablet Take 81 mg by  mouth daily.      . digoxin (LANOXIN) 0.125 MG tablet Take 0.125 mg by mouth daily.      Marland Kitchen. lisinopril-hydrochlorothiazide (PRINZIDE,ZESTORETIC) 20-25 MG per tablet Take 1 tablet by mouth daily.  90 tablet  2  . metoprolol succinate (TOPROL-XL) 100 MG 24 hr tablet Take 1 tablet (100 mg total) by mouth daily. Take with or immediately following a meal.  30 tablet  5  . Multiple Vitamins-Minerals (CENTRUM SILVER ADULT 50+ PO) Take 1 tablet by mouth daily.        Allergies: Allergies as of 04/26/2013 - Review Complete 04/26/2013  Allergen Reaction Noted  . Shrimp [shellfish allergy]  10/12/2011   Past Medical History  Diagnosis Date  . Atrial fibrillation     on coumadin since 2012, recently stopped because of vaginal bleeding  . Hypertension   . Hyperlipidemia   . Routine adult health maintenance 04/09/2012   History reviewed. No pertinent past surgical history. No family history on file. History   Social History  . Marital Status: Widowed    Spouse Name: N/A    Number of Children: N/A  . Years of Education: N/A   Occupational History  . Not on file.   Social History Main Topics  . Smoking status: Never Smoker   . Smokeless tobacco: Not on file  . Alcohol Use:  No  . Drug Use: No  . Sexual Activity: Not on file   Other Topics Concern  . Not on file   Social History Narrative  . No narrative on file    Review of Systems: General: Denies fever, chills, diaphoresis, appetite change and fatigue.  Respiratory: Positive for DOE. Denies cough, chest tightness, and wheezing.   Cardiovascular: Denies chest pain and palpitations.  Gastrointestinal: Denies nausea, vomiting, abdominal pain, diarrhea, constipation, blood in stool and abdominal distention.  Genitourinary: Denies dysuria, urgency, frequency, hematuria, and flank pain. Endocrine: Denies hot or cold intolerance, polyuria, and polydipsia. Musculoskeletal: Positive for recent LE swelling. Denies myalgias, back pain, joint  swelling, arthralgias and gait problem.  Skin: Denies pallor, rash and wounds.  Neurological: Denies dizziness, seizures, syncope, weakness, lightheadedness, numbness and headaches.  Psychiatric/Behavioral: Denies mood changes, confusion, nervousness, sleep disturbance and agitation.  Physical Exam: Filed Vitals:   04/26/13 2118 04/27/13 0028  BP: 129/85 150/91  Pulse: 83 57  Temp: 97.7 F (36.5 C)   Resp: 20 27  Height: 5\' 3"  (1.6 m)   Weight: 167 lb (75.751 kg)   SpO2: 97% 97%  General: Vital signs reviewed.  Patient is an elderly female, well-developed and well-nourished, in no acute distress and cooperative with exam.  Head: Normocephalic and atraumatic. Eyes: PERRL, EOMI, conjunctivae normal, No scleral icterus.  Neck: Supple, trachea midline, normal ROM, JVD (8-9 cm) w/ +ve HJR. No masses, thyromegaly, or carotid bruit present.  Cardiovascular: RRR, S1 normal, S2 normal, III/VI systolic murmur, loudest over left sternal border. No gallops or rubs. Pulmonary/Chest: Air entry equal bilaterally, mild bibasilar crackles. No wheezes. Abdominal: Soft, non-tender, non-distended, BS +, no masses, organomegaly, or guarding present.  Musculoskeletal: No joint deformities, erythema, or stiffness, ROM full and nontender. Extremities: +3 pitting edema extending to the knee, L>R. Pulses symmetric and intact bilaterally. No cyanosis or clubbing. Mild right LE varicosities.  Neurological: A&O x3, Strength is normal and symmetric bilaterally, cranial nerve II-XII are grossly intact, no focal motor deficit, sensory intact to light touch bilaterally.  Skin: Warm, dry and intact. No rashes or erythema. Psychiatric: Normal mood and affect. speech and behavior is normal. Cognition and memory are normal.   Lab results: Basic Metabolic Panel:  Recent Labs  16/10/96 2130  NA 134*  K 4.3  CL 95*  CO2 29  GLUCOSE 117*  BUN 21  CREATININE 0.79  CALCIUM 9.0   Liver Function Tests:  Recent  Labs  04/26/13 2130  AST 27  ALT 26  ALKPHOS 47  BILITOT 0.5  PROT 6.3  ALBUMIN 2.8*   CBC:  Recent Labs  04/26/13 2130  WBC 5.4  NEUTROABS 2.3  HGB 13.2  HCT 39.3  MCV 86.2  PLT 153   Cardiac Enzymes:  Recent Labs  04/26/13 2130  TROPONINI <0.30   BNP:  Recent Labs  04/26/13 2130  PROBNP 7233.0*   Imaging results:  Dg Chest 2 View  04/26/2013   CLINICAL DATA:  Arrhythmia.  EXAM: CHEST  2 VIEW  COMPARISON:  None.  FINDINGS: Cardiac silhouette markedly enlarged. Thoracic aorta tortuous and atherosclerotic. Hilar and mediastinal contours otherwise unremarkable. Linear scar or atelectasis in the inferior left upper lobe. Mild pulmonary venous hypertension without overt edema. Lungs otherwise clear. No confluent airspace consolidation. Bronchovascular markings normal. Blunting of the left costophrenic angle. Degenerative changes involving the thoracic spine.  IMPRESSION: 1. Marked cardiomegaly. Pulmonary venous hypertension without overt edema. 2. Blunting the left costophrenic angle which may be due  to a small left pleural effusion or chronic pleuroparenchymal scarring. 3. Linear atelectasis or scar in the inferior left upper lobe. No acute cardiopulmonary disease otherwise.   Electronically Signed   By: Hulan Saas M.D.   On: 04/26/2013 22:18   Other results: EKG: NSR @ 85. 1st degree AV block, TWI in leads I, aVL, VI (unchanged from previous), w/ LAFB.  Assessment & Plan by Problem:  [name] is a 80 y.o. female w/ PMHx of Atrial Fibrillation, HTN, HLD, admitted for CHF exacerbation.   Suspected CHF exacerbation- Patient w/ new LE swelling, DOE, and recent orthopnea. Pulmonary exam significant for bibasilar crackles as well as III/VI systolic murmur loudest over the left sternal border. pBNP of 7233 on admission, however, no previous pBNP for comparison. CXR shows marked cardiomegaly, pulmonary venous HTN, w/out overt pulmonary edema, w/ possible small left  pleural effusion. Most recent ECHO from 04/23/2009 shows severe global left ventricular dysfunction w/ EF of 15-20%, mild to moderate mitral regurgitation and moderate tricuspid regurgitation. Given no history of pulmonary disease and absence of wheezing on exam, do not suspect pulmonary etiology explaining SOB. On exam, patient w/ slightly greater swelling in the right leg than the left, however, patient w/ known varicosities in the right leg. She denies leg pain or increased temperature, no erythema on exam. Not tender to palpation, no recent surgeries or prolonged immobilization, no previous h/o DVT/PE. Revised [place] shows moderate risk of PE, however, Well's Criteria low risk for PE. Patient reports no chest pain, TIMI score of 2. However, given absence of symptoms up until this time, further cardiac investigation is necessary in this high functioning individual. Given Lasix 40 mg IV in ED. -Admit to telemetry -Continue Lasix 40 mg IV q6h for now. Readjust accordingly -ECHO in AM -Repeat EKG in AM -Daily weights -Strict I/O's -Cycle troponins, 1/3 negative -RLE doppler, r/o DVT given asymmetric edema -Cardiology consult in AM given h/o low EF on previous ECHO.  -Continue home meds; Toprol XL, Digoxin, ASA  Atrial Fibrillation- Patient w/ h/o atrial fibrillation, on Toprol XL 100 mg po qd + Digoxin 0.125 mg po qd at home. Currently, patient is NSR w/ EKG findings as above. No significant ST/T wave changes from previous ECG, however, some slight difference in QRS morphology, suggesting interventricular block. -Continue home meds -Cardiology consult as above  HTN- Patient normotensive on exam. On Lisinopril-HCTZ + Toprol XL at home.  -Hold Prinzide for now -Lasix as above -Toprol XL as above  HLD- Most current lipid panel from 2009; total cholesterol of 248, LDL of 158. Not on a statin at home -No intervention at this time  DVT/PE PPx- Heparin [place]  Dispo: Disposition is deferred at this  time, awaiting improvement of current medical problems. Anticipated discharge in approximately 2-3 day(s).   The patient does have a current PCP Inez Catalina, MD) and does need an [facility] hospital follow-up appointment after discharge.  The patient does not have transportation limitations that hinder transportation to clinic appointments.  Signed: Courtney Paris, MD 04/27/2013, 12:56 AM

## 2013-04-27 NOTE — Progress Notes (Signed)
Subjective: Pt had NAEON. Pt feels well this AM. Feels her LE edema has improved and that "too much blood has been taken from her." The necessity of lab draws was explained to the patient. Pt is accompanied by her son in the room. She is able to get to commode w/o DOE or SOB, laying more flat this AM per pt report.   Objective: Vital signs in last 24 hours: Filed Vitals:   04/27/13 0145 04/27/13 0224 04/27/13 0236 04/27/13 0525  BP: 134/88 125/84  96/62  Pulse: 81 88  84  Temp:  97.5 F (36.4 C)  97.4 F (36.3 C)  TempSrc:  Oral  Oral  Resp: 20 21  21   Height:   5\' 3"  (1.6 m)   Weight:   164 lb 3.2 oz (74.481 kg)   SpO2: 98% 98%  97%   Weight change:   Intake/Output Summary (Last 24 hours) at 04/27/13 0848 Last data filed at 04/27/13 0700  Gross per 24 hour  Intake    240 ml  Output   1450 ml  Net  -1210 ml   General: resting in bed, NAD HEENT: PERRL, no scleral icterus, MMM, JVD +8 Cardiac: RRR, no rubs, 2/4 blowing systolic murmur LUSB w/o radiation, no gallops Pulm: some soft crackles lower lobes bilaterally, no crackles/wheezes/rhonchi, moving normal volumes of air Abd: soft, nontender, nondistended, BS present Ext: warm and well perfused,3+ pitting edema to knees bilaterally Neuro: alert and oriented X3, cranial nerves II-XII grossly intact  Lab Results: Basic Metabolic Panel:  Recent Labs Lab 04/26/13 2130 04/27/13 0430  NA 134*  --   K 4.3  --   CL 95*  --   CO2 29  --   GLUCOSE 117*  --   BUN 21  --   CREATININE 0.79  --   CALCIUM 9.0  --   MG  --  1.9   Liver Function Tests:  Recent Labs Lab 04/26/13 2130  AST 27  ALT 26  ALKPHOS 47  BILITOT 0.5  PROT 6.3  ALBUMIN 2.8*   CBC:  Recent Labs Lab 04/26/13 2130  WBC 5.4  NEUTROABS 2.3  HGB 13.2  HCT 39.3  MCV 86.2  PLT 153   Cardiac Enzymes:  Recent Labs Lab 04/26/13 2130 04/27/13 0430  TROPONINI <0.30 0.32*   BNP:  Recent Labs Lab 04/26/13 2130  PROBNP 7233.0*   Micro  Results: No results found for this or any previous visit (from the past 240 hour(s)). Studies/Results: Dg Chest 2 View  04/26/2013   CLINICAL DATA:  Arrhythmia.  EXAM: CHEST  2 VIEW  COMPARISON:  None.  FINDINGS: Cardiac silhouette markedly enlarged. Thoracic aorta tortuous and atherosclerotic. Hilar and mediastinal contours otherwise unremarkable. Linear scar or atelectasis in the inferior left upper lobe. Mild pulmonary venous hypertension without overt edema. Lungs otherwise clear. No confluent airspace consolidation. Bronchovascular markings normal. Blunting of the left costophrenic angle. Degenerative changes involving the thoracic spine.  IMPRESSION: 1. Marked cardiomegaly. Pulmonary venous hypertension without overt edema. 2. Blunting the left costophrenic angle which may be due to a small left pleural effusion or chronic pleuroparenchymal scarring. 3. Linear atelectasis or scar in the inferior left upper lobe. No acute cardiopulmonary disease otherwise.   Electronically Signed   By: Hulan Saashomas  Lawrence M.D.   On: 04/26/2013 22:18   Medications: I have reviewed the patient's current medications. Scheduled Meds: . aspirin EC  81 mg Oral Daily  . digoxin  0.125 mg Oral Daily  .  furosemide  40 mg Intravenous Q6H  . furosemide  40 mg Intravenous STAT  . heparin  5,000 Units Subcutaneous 3 times per day  . metoprolol succinate  100 mg Oral Daily  . sodium chloride  3 mL Intravenous Q12H  . sodium chloride  3 mL Intravenous Q12H   Continuous Infusions:  PRN Meds:.sodium chloride, sodium chloride Assessment/Plan: Acute CHF exacerbation- Most recent ECHO from 04/23/2009 shows severe global left ventricular dysfunction w/ EF of 15-20%, mild to moderate mitral regurgitation and moderate tricuspid regurgitation. Patient w/ new worsening LE swelling, DOE, and recent orthopnea over previous weeks. Pt stated she was on lasix before but her cardiologist Dr. Katrinka Blazing. Pt states she only sees her cardiologist  when she "has a problem" and that hasn't been an issue for at least 6 months.  Pt looks fluid overloaded and  pBNP of 7233 on admission, however, no previous pBNP for comparison. CXR shows marked cardiomegaly, pulmonary venous HTN, w/out overt pulmonary edema, w/ possible small left pleural effusion. Patient reports no chest pain, TIMI score of 2. Trops x2 have been negative.   -Continue Lasix 40 mg IV BID -trend bmet -ECHO in AM  -Repeat EKG in AM  -Daily weights  -Strict I/O's  -RLE doppler still pending  -Continue home meds; Toprol XL, Digoxin, ASA   Atrial Fibrillation- Patient w/ h/o atrial fibrillation, on Toprol XL 100 mg po qd + Digoxin 0.125 mg po qd at home. Currently, patient is NSR w/ EKG findings as above. No significant ST/T wave changes from previous ECG, however, some slight difference in QRS morphology, suggesting interventricular block.  -Continue home meds   HTN- Patient normotensive on exam. On Lisinopril-HCTZ + Toprol XL at home.  -Hold Prinzide for now  -Lasix as above  -Toprol XL as above   HLD- Most current lipid panel from 2009; total cholesterol of 248, LDL of 158. Not on a statin at home   Dispo: Disposition is deferred at this time, awaiting improvement of current medical problems.  Anticipated discharge in approximately 1-2 day(s).   The patient does have a current PCP Inez Catalina, MD) and does need an [facility] hospital follow-up appointment after discharge.  The patient does not have transportation limitations that hinder transportation to clinic appointments.  .Services Needed at time of discharge: Y = Yes, Blank = No PT:   OT:   RN:   Equipment:   Other:     LOS: 0 days   Christen Bame, MD 04/27/2013, 8:48 AM

## 2013-04-27 NOTE — ED Notes (Signed)
Admitting MD at BS.  

## 2013-04-27 NOTE — Progress Notes (Signed)
Echo Lab  2D Echocardiogram completed.  [name], RDCS 04/27/2013 9:51 AM

## 2013-04-27 NOTE — Progress Notes (Signed)
VASCULAR LAB PRELIMINARY  PRELIMINARY  PRELIMINARY  PRELIMINARY  Right lower extremity venous Doppler completed.    Preliminary report:  There is no DVT or SVT noted in the right lower extremity.   Jonnatan Hanners, RVT 04/27/2013, 4:20 PM

## 2013-04-27 NOTE — Progress Notes (Signed)
Admitted pt to rm 3E17 from ED via stretcher, pt alert and oriented, denies pain at this time, oriented to room, call bell placed within reach, orders carried out. Admission assessment done. Will monitor.

## 2013-04-27 NOTE — ED Notes (Signed)
Stop at a bathroom for pt to void en route to floor.

## 2013-04-27 NOTE — H&P (Signed)
  Date: 04/27/2013  Patient name: Jean Moore  Medical record number: [id number]  Date of birth: 11-22-1926   I have seen and evaluated Jean Moore and discussed their care with the Residency Team. Jean Moore is most distressed about her LE edema, which has been progressing for 1 week. She also has slowly progressive DOE and orthopnea for 2-3 weeks. She has noticed her weight increasing since the holidays. She has known chronic systolic heart failure, with last EF of 15 -20 %, mgmt by Dr Katrinka Blazing. Normally, her activities are not limited by dyspnea.   She has been diuresed, net negative 800 mL since admission. She feels her breathing is at baseline and her legs have improved.  She describes B post upper leg pain with walking that resolves with sitting but also on long car rides.   Her son lives with her but the pt is independent - able to clean whole house, drives, cooks.   On exam, her BP is 100/60, sl lower than what it has been in the clinic. She was able to speak in full sentences. Her lungs had good air flow, min crackles in R base. Legs : edema R>L. +1  Labs, CXR, EKG reviewed  Assessment and Plan: I have seen and evaluated the patient as outlined above. I agree with the formulated Assessment and Plan as detailed in the residents' admission note, with the following changes:   1. LE edema - likely 2/2 volume overload but LE doppler to R/O DVT since asymmetric.   2. Acute on chronic B heart failure - She has known L sided sys failure. Therefore,  The most likely cause of her R heart failure is from the L. And the R HF is the most likely cause of her LE edema. She is on BB, ACEI, Dig, ASA. She has tolerated diuresis well and lasix is being adjusted. Cards is being consulted to see if she needs any other intervention / W.U other than vol control.   3. H/O A fib - She is not on Warfarin 2/2 vaginal bleeding. She is rate controlled and on ASA.  Burns Spain, MD 3/21/201512:37 PM

## 2013-04-27 NOTE — Progress Notes (Signed)
Pt's trop=0.32. Dr. Yetta Barre notified, no new orders made. Will monitor.

## 2013-04-27 NOTE — ED Provider Notes (Signed)
Medical screening examination/treatment/procedure(s) were performed by a resident physician or non-physician practitioner and as the supervising physician I was immediately available for consultation/collaboration.  Evan Corey, MD    Evan S Corey, MD 04/27/13 0842 

## 2013-04-27 NOTE — ED Notes (Signed)
Pt reports she has intermittent pain in the back of her right thigh from time to time, pt denies this pain now. Pt states this has been going on for 1 year.

## 2013-04-27 NOTE — ED Provider Notes (Signed)
CSN: [id number]     Arrival date & time 04/26/13  2111 History   First MD Initiated Contact with Patient 04/27/13 0019     Chief Complaint  Patient presents with  . Irregular Heart Beat     (Consider location/radiation/quality/duration/timing/severity/associated sxs/prior Treatment) HPI Comments: 80 year old female.  She presents from the urgent care with a complaint of bilateral leg swelling. She had an EKG which was abnormal and was sent to the hospital for further evaluation. She states that her swelling started several days ago.  The swelling is persistent, gradually worsening, severe and prevents her from wearing her normal shoes. She also reports dyspnea on exertion which has been getting more prominent over the last couple of weeks. She denies any chest pain and denies any cardiac history.  She is seen by Dr. Verdis Prime for her atrial fibrillation and is taken care of at the internal medicine clinic for her hypertension.  The history is provided by the patient and a relative.    Past Medical History  Diagnosis Date  . Atrial fibrillation     on coumadin since 2012, recently stopped because of vaginal bleeding  . Hypertension   . Hyperlipidemia   . Routine adult health maintenance 04/09/2012   History reviewed. No pertinent past surgical history. No family history on file. History  Substance Use Topics  . Smoking status: Never Smoker   . Smokeless tobacco: Not on file  . Alcohol Use: No   OB History   Grav Para Term Preterm Abortions TAB SAB Ect Mult Living                 Review of Systems  All other systems reviewed and are negative.      Allergies  Shrimp  Home Medications   Current Outpatient Rx  Name  Route  Sig  Dispense  Refill  . acetaminophen (TYLENOL) 500 MG tablet   Oral   Take 500 mg by mouth every 6 (six) hours as needed for pain.         Marland Kitchen aspirin 81 MG tablet   Oral   Take 81 mg by mouth daily.         . digoxin (LANOXIN) 0.125 MG  tablet   Oral   Take 0.125 mg by mouth daily.         Marland Kitchen lisinopril-hydrochlorothiazide (PRINZIDE,ZESTORETIC) 20-25 MG per tablet   Oral   Take 1 tablet by mouth daily.   90 tablet   2   . metoprolol succinate (TOPROL-XL) 100 MG 24 hr tablet   Oral   Take 1 tablet (100 mg total) by mouth daily. Take with or immediately following a meal.   30 tablet   5   . Multiple Vitamins-Minerals (CENTRUM SILVER ADULT 50+ PO)   Oral   Take 1 tablet by mouth daily.          BP 150/91  Pulse 57  Temp(Src) 97.7 F (36.5 C)  Resp 27  Ht 5\' 3"  (1.6 m)  Wt 167 lb (75.751 kg)  BMI 29.59 kg/m2  SpO2 97%  LMP 10/12/1978 Physical Exam  Nursing note and vitals reviewed. Constitutional: She appears well-developed and well-nourished. No distress.  HENT:  Head: Normocephalic and atraumatic.  Mouth/Throat: Oropharynx is clear and moist. No oropharyngeal exudate.  Eyes: Conjunctivae and EOM are normal. Pupils are equal, round, and reactive to light. Right eye exhibits no discharge. Left eye exhibits no discharge. No scleral icterus.  Neck: Normal range of motion. Neck  supple. No JVD present. No thyromegaly present.  Cardiovascular: Normal rate, regular rhythm, normal heart sounds and intact distal pulses.  Exam reveals no gallop and no friction rub.   No murmur heard. Pulmonary/Chest: Effort normal. No respiratory distress. She has no wheezes. She has rales ( Subtle rales at the bases bilaterally).  Abdominal: Soft. Bowel sounds are normal. She exhibits no distension and no mass. There is no tenderness.  Musculoskeletal: Normal range of motion. She exhibits edema (significant bilateral lower extremity symmetrical pitting edema). She exhibits no tenderness.  Lymphadenopathy:    She has no cervical adenopathy.  Neurological: She is alert. Coordination normal.  Skin: Skin is warm and dry. No rash noted. No erythema.  Psychiatric: She has a normal mood and affect. Her behavior is normal.    ED  Course  Procedures (including critical care time) Labs Review Labs Reviewed  CBC WITH DIFFERENTIAL - Abnormal; Notable for the following:    Lymphocytes Relative 48 (*)    All other components within normal limits  COMPREHENSIVE METABOLIC PANEL - Abnormal; Notable for the following:    Sodium 134 (*)    Chloride 95 (*)    Glucose, Bld 117 (*)    Albumin 2.8 (*)    GFR calc non Af Amer 74 (*)    GFR calc Af Amer 86 (*)    All other components within normal limits  PRO B NATRIURETIC PEPTIDE - Abnormal; Notable for the following:    Pro B Natriuretic peptide (BNP) 7233.0 (*)    All other components within normal limits  TROPONIN I  DIGOXIN LEVEL   Imaging Review Dg Chest 2 View  04/26/2013   CLINICAL DATA:  Arrhythmia.  EXAM: CHEST  2 VIEW  COMPARISON:  None.  FINDINGS: Cardiac silhouette markedly enlarged. Thoracic aorta tortuous and atherosclerotic. Hilar and mediastinal contours otherwise unremarkable. Linear scar or atelectasis in the inferior left upper lobe. Mild pulmonary venous hypertension without overt edema. Lungs otherwise clear. No confluent airspace consolidation. Bronchovascular markings normal. Blunting of the left costophrenic angle. Degenerative changes involving the thoracic spine.  IMPRESSION: 1. Marked cardiomegaly. Pulmonary venous hypertension without overt edema. 2. Blunting the left costophrenic angle which may be due to a small left pleural effusion or chronic pleuroparenchymal scarring. 3. Linear atelectasis or scar in the inferior left upper lobe. No acute cardiopulmonary disease otherwise.   Electronically Signed   By: Hulan Saas M.D.   On: 04/26/2013 22:18    ED ECG REPORT  I personally interpreted this EKG   Date: 04/27/2013   Rate: 87  Rhythm: normal sinus rhythm  QRS Axis: left  Intervals: PR prolonged  ST/T Wave abnormalities: nonspecific ST/T changes  Conduction Disutrbances:nonspecific intraventricular conduction delay  Narrative  Interpretation: not in afib  Old EKG Reviewed: changes noted   MDM   Final diagnoses:  CHF (congestive heart failure)    Patient has no JVD, she does have rales and peripheral edema consistent with congestive heart failure. Her EKG shows low-voltage, left axis, widened QRS and nonspecific pattern and occasional ectopy. She is not complaining of palpitations to me. I do suspect that the patient has congestive heart failure, her BNP is significantly elevated, labs otherwise appear unremarkable including troponin, compress and metabolic panel and CBC. Of note she does have a slightly low albumin  Discussed with internal medicine teaching service, resident will come to admit. Lasix given   Meds given in ED:  Medications  furosemide (LASIX) injection 40 mg (not administered)  Vida RollerBrian D Pink Maye, MD 04/27/13 [phone]

## 2013-04-28 DIAGNOSIS — I5023 Acute on chronic systolic (congestive) heart failure: Principal | ICD-10-CM

## 2013-04-28 LAB — BASIC METABOLIC PANEL
BUN: 16 mg/dL (ref 6–23)
CHLORIDE: 95 meq/L — AB (ref 96–112)
CO2: 30 mEq/L (ref 19–32)
Calcium: 8.7 mg/dL (ref 8.4–10.5)
Creatinine, Ser: 0.67 mg/dL (ref 0.50–1.10)
GFR, EST NON AFRICAN AMERICAN: 79 mL/min — AB (ref >90)
Glucose, Bld: 93 mg/dL (ref 70–99)
POTASSIUM: 3.4 meq/L — AB (ref 3.7–5.3)
SODIUM: 138 meq/L (ref 137–147)

## 2013-04-28 MED ORDER — ATORVASTATIN CALCIUM 40 MG PO TABS
40.0000 mg | ORAL_TABLET | Freq: Every day | ORAL | Status: DC
  Administered 2013-04-28: 40 mg via ORAL
  Filled 2013-04-28 (×2): qty 1

## 2013-04-28 MED ORDER — POTASSIUM CHLORIDE CRYS ER 20 MEQ PO TBCR
40.0000 meq | EXTENDED_RELEASE_TABLET | Freq: Once | ORAL | Status: AC
  Administered 2013-04-28: 40 meq via ORAL
  Filled 2013-04-28: qty 2

## 2013-04-28 MED ORDER — FUROSEMIDE 40 MG PO TABS
40.0000 mg | ORAL_TABLET | Freq: Two times a day (BID) | ORAL | Status: DC
  Administered 2013-04-28 – 2013-04-29 (×2): 40 mg via ORAL
  Filled 2013-04-28 (×4): qty 1

## 2013-04-28 MED ORDER — LISINOPRIL 10 MG PO TABS
10.0000 mg | ORAL_TABLET | Freq: Every day | ORAL | Status: DC
  Administered 2013-04-28 – 2013-04-29 (×2): 10 mg via ORAL
  Filled 2013-04-28 (×2): qty 1

## 2013-04-28 NOTE — Progress Notes (Signed)
Subjective:  Jean Moore. Diuresing well.   Objective: Vital signs in last 24 hours: Filed Vitals:   04/27/13 1001 04/27/13 1443 04/27/13 2159 04/28/13 0444  BP: 100/60 115/78 116/90 92/70  Pulse:  87 102 70  Temp:  97.8 F (36.6 C) 98.4 F (36.9 C) 98 F (36.7 C)  TempSrc:  Oral Oral Oral  Resp:  20 20 18   Height:      Weight:    154 lb 6.4 oz (70.035 kg)  SpO2:  100% 98% 100%   Weight change: -12 lb 9.6 oz (-5.715 kg)  Intake/Output Summary (Last 24 hours) at 04/28/13 [id number] Last data filed at 04/28/13 [id number]  Gross per 24 hour  Intake   1280 ml  Output   3600 ml  Net  -2320 ml   Physical Exam  Constitutional: She appears well-developed and well-nourished.  HENT:  Head: Normocephalic.  Mouth/Throat: Oropharynx is clear and moist. No oropharyngeal exudate.  Cardiovascular: Normal rate and intact distal pulses.   Murmur heard. 3/6 systolic murmur loudest on the RUSB  Pulmonary/Chest: Effort normal.  Mild basilar rales  Musculoskeletal: She exhibits edema.  2+ edema to knees bil  Psychiatric: She has a normal mood and affect. Her behavior is normal.    Lab Results: Basic Metabolic Panel:  Recent Labs Lab 04/26/13 2130 04/27/13 0430 04/27/13 1902  NA 134*  --  136*  K 4.3  --  3.7  CL 95*  --  92*  CO2 29  --  33*  GLUCOSE 117*  --  179*  BUN 21  --  19  CREATININE 0.79  --  0.82  CALCIUM 9.0  --  8.8  MG  --  1.9  --    Liver Function Tests:  Recent Labs Lab 04/26/13 2130  AST 27  ALT 26  ALKPHOS 47  BILITOT 0.5  PROT 6.3  ALBUMIN 2.8*   CBC:  Recent Labs Lab 04/26/13 2130  WBC 5.4  NEUTROABS 2.3  HGB 13.2  HCT 39.3  MCV 86.2  PLT 153   Cardiac Enzymes:  Recent Labs Lab 04/27/13 0430 04/27/13 0840 04/27/13 1902  TROPONINI 0.32* 0.32* 0.30*   BNP:  Recent Labs Lab 04/26/13 2130  PROBNP 7233.0*   Thyroid Function Tests:  Recent Labs Lab 04/27/13 0430  TSH 0.838   Studies/Results: Dg Chest 2 View  04/26/2013    CLINICAL DATA:  Arrhythmia.  EXAM: CHEST  2 VIEW  COMPARISON:  None.  FINDINGS: Cardiac silhouette markedly enlarged. Thoracic aorta tortuous and atherosclerotic. Hilar and mediastinal contours otherwise unremarkable. Linear scar or atelectasis in the inferior left upper lobe. Mild pulmonary venous hypertension without overt edema. Lungs otherwise clear. No confluent airspace consolidation. Bronchovascular markings normal. Blunting of the left costophrenic angle. Degenerative changes involving the thoracic spine.  IMPRESSION: 1. Marked cardiomegaly. Pulmonary venous hypertension without overt edema. 2. Blunting the left costophrenic angle which may be due to a small left pleural effusion or chronic pleuroparenchymal scarring. 3. Linear atelectasis or scar in the inferior left upper lobe. No acute cardiopulmonary disease otherwise.   Electronically Signed   By: Hulan Saashomas  Lawrence M.D.   On: 04/26/2013 22:18   Medications: I have reviewed the patient's current medications. Scheduled Meds: . aspirin EC  81 mg Oral Daily  . atorvastatin  40 mg Oral q1800  . digoxin  0.125 mg Oral Daily  . furosemide  40 mg Intravenous BID  . heparin  5,000 Units Subcutaneous 3 times per day  . metoprolol  succinate  100 mg Oral Daily  . potassium chloride  40 mEq Oral Once  . sodium chloride  3 mL Intravenous Q12H  . sodium chloride  3 mL Intravenous Q12H   Continuous Infusions:  PRN Meds:.sodium chloride, sodium chloride Assessment/Plan: Principal Problem:   Acute on chronic systolic heart failure Active Problems:   HYPERTENSION   Atrial fibrillation   CHF (congestive heart failure)   Acute CHF exacerbation-  Patient w/ new worsening LE swelling, DOE, and recent orthopnea over previous weeks. Pt looks fluid overloaded and pBNP of 7233 on admission, however, no previous pBNP for comparison. CXR shows marked cardiomegaly, pulmonary venous HTN, w/out overt pulmonary edema, w/ possible small left pleural effusion.  Patient reports no chest pain, TIMI score of 2. Trop + at .32 that downtrended to 0.30. ECHO demonstrated EF 10-15% and dyskinesis of the anteroseptum. -Continue Lasix 40 mg IV BID  -trend bmet  -Daily weights  -Strict I/O's  -Continue home meds; Toprol XL, Digoxin, ASA  - Consulted Cards (appreciate rec's) - started statin  Atrial Fibrillation- Patient w/ h/o atrial fibrillation, on Toprol XL 100 mg po qd + Digoxin 0.125 mg po qd at home. Currently, patient is NSR -Continue home meds   HTN- Patient normotensive on exam. On Lisinopril-HCTZ + Toprol XL at home.  -Hold Prinzide for now  -Lasix as above  -Toprol XL as above   HLD- Most current lipid panel from 2009; total cholesterol of 248, LDL of 158. Not on a statin at home  - start statin   Dispo: Disposition is deferred at this time, awaiting improvement of current medical problems.  Anticipated discharge in approximately 1-2 day(s).   The patient does have a current PCP Inez Catalina, MD) and does need an [facility] hospital follow-up appointment after discharge.  The patient does not know have transportation limitations that hinder transportation to clinic appointments.  .Services Needed at time of discharge: Y = Yes, Blank = No PT:   OT:   RN:   Equipment:   Other:     LOS: 1 day   Pleas Koch, MD 04/28/2013, 7:22 AM

## 2013-04-28 NOTE — Consult Note (Addendum)
Advanced Heart Failure Team Consult Note  Referring Physician: IMTS Primary Physician: Primary Cardiologist:  Garnette Scheuermann, MD  Reason for Consultation: HF  HPI:    80 y/o woman with h/o HTN, HL, AF and systolic HF with EF 20% followed by Dr. Katrinka Blazing. Admitted with recurrent HF. Consult called due to worsening EF.   Has been followed by Dr. Katrinka Blazing for her HF. EF previously reported at 20%. Says she had a cath a long time ago but doesn't remember result. Admitted with recurrent HF and echo with EF 10-15% with mild AS (may be underestimated). Moderate RV dysfunction. Diuresing well with IV lasix. Weight down 13 pounds in 2 days.   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 Has to take her time though or she gets SOB. Came to hospital due to increasing LE edema. Does not weigh regularly. No CP. No syncope or palpitations.    Echo 04/27/13 - Left ventricle: The cavity size was severely dilated. Wall thickness was increased in a pattern of mild LVH. Systolic function was severely reduced. The estimated ejection fraction was in the range of 10% to 15%. Dyskinesis of the anteroseptal myocardium. There was fusion of early and atrial contributions to ventricular filling. - Aortic valve: There was mild stenosis. Valve area: 1.02cm^2(VTI). Valve area: 1.25cm^2 (Vmax). - Mitral valve: Mildly calcified annulus. Mild regurgitation. - Left atrium: The atrium was moderately dilated. - Right ventricle: The cavity size was moderately dilated. Systolic function was moderately reduced. - Right atrium: The atrium was moderately dilated. - Tricuspid valve: Moderate regurgitation. - Pulmonary arteries: Systolic pressure was moderately to severely increased. PA peak pressure: 62mm Hg (S). - Pericardium, extracardiac: A small pericardial effusion was identified along the left ventricular free wall.    Review of Systems: [y] = yes, [ ]  = no   General: Weight gain [ y];  Weight loss [ ] ; Anorexia [ ] ; Fatigue [ y]; Fever [ ] ; Chills [ ] ; Weakness [ ]   Cardiac: Chest pain/pressure [ ] ; Resting SOB [ ] ; Exertional SOB Cove.Etienne ]; Orthopnea [ ] ; Pedal Edema [ ] ; Palpitations [ ] ; Syncope [ ] ; Presyncope [ ] ; Paroxysmal nocturnal dyspnea[ ]   Pulmonary: Cough [ ] ; Wheezing[ ] ; Hemoptysis[ ] ; Sputum [ ] ; Snoring [ ]   GI: Vomiting[ ] ; Dysphagia[ ] ; Melena[ ] ; Hematochezia [ ] ; Heartburn[ ] ; Abdominal pain [ ] ; Constipation [ ] ; Diarrhea [ ] ; BRBPR [ ]   GU: Hematuria[ ] ; Dysuria [ ] ; Nocturia[ ]   Vascular: Pain in legs with walking [ ] ; Pain in feet with lying flat [ ] ; Non-healing sores [ ] ; Stroke [ ] ; TIA [ ] ; Slurred speech [ ] ;  Neuro: Headaches[ ] ; Vertigo[ ] ; Seizures[ ] ; Paresthesias[ ] ;Blurred vision [ ] ; Diplopia [ ] ; Vision changes [ ]   Ortho/Skin: Arthritis [ ] ; Joint pain Cove.Etienne ]; Muscle pain [ ] ; Joint swelling [ ] ; Back Pain [ ] ; Rash [ ]   Psych: Depression[ ] ; Anxiety[ ]   Heme: Bleeding problems [ ] ; Clotting disorders [ ] ; Anemia [ ]   Endocrine: Diabetes [ ] ; Thyroid dysfunction[ ]   Home Medications Prior to Admission medications   Medication Sig Start Date End Date Taking? Authorizing Provider  acetaminophen (TYLENOL) 500 MG tablet Take 500 mg by mouth every 6 (six) hours as needed for pain.   Yes Historical Provider, MD  aspirin 81 MG tablet Take 81 mg by mouth daily.   Yes Historical Provider, MD  digoxin (LANOXIN) 0.125 MG tablet Take 0.125 mg  by mouth daily.   Yes Historical Provider, MD  lisinopril-hydrochlorothiazide (PRINZIDE,ZESTORETIC) 20-25 MG per tablet Take 1 tablet by mouth daily. 02/27/13  Yes Lyn Records III, MD  metoprolol succinate (TOPROL-XL) 100 MG 24 hr tablet Take 1 tablet (100 mg total) by mouth daily. Take with or immediately following a meal. 12/04/12  Yes Lesleigh Noe, MD  Multiple Vitamins-Minerals (CENTRUM SILVER ADULT 50+ PO) Take 1 tablet by mouth daily.   Yes Historical Provider, MD    Past Medical History: Past Medical  History  Diagnosis Date  . Atrial fibrillation     on coumadin since 2012, recently stopped because of vaginal bleeding  . Hypertension   . Hyperlipidemia   . Routine adult health maintenance 04/09/2012  . CHF (congestive heart failure)     Past Surgical History: Past Surgical History  Procedure Laterality Date  . No past surgeries      Family History: There is no family history of early CAD of familial cardiomyopathy.   Social History: History   Social History  . Marital Status: Widowed    Spouse Name: N/A    Number of Children: N/A  . Years of Education: N/A   Social History Main Topics  . Smoking status: Never Smoker   . Smokeless tobacco: None  . Alcohol Use: No  . Drug Use: No  . Sexual Activity: None   Other Topics Concern  . None   Social History Narrative  . None    Allergies:  Allergies  Allergen Reactions  . Shrimp [Shellfish Allergy]     Objective:    Vital Signs:   Temp:  [97.6 F (36.4 C)-98.4 F (36.9 C)] 97.6 F (36.4 C) (03/22 1350) Pulse Rate:  [70-102] 96 (03/22 1350) Resp:  [18-20] 18 (03/22 1350) BP: (92-119)/(60-90) 119/74 mmHg (03/22 1350) SpO2:  [98 %-100 %] 100 % (03/22 1350) Weight:  [70.035 kg (154 lb 6.4 oz)] 70.035 kg (154 lb 6.4 oz) (03/22 0444) Last BM Date: 04/25/13  Weight change: Filed Weights   04/26/13 2118 04/27/13 0236 04/28/13 0444  Weight: 75.751 kg (167 lb) 74.481 kg (164 lb 3.2 oz) 70.035 kg (154 lb 6.4 oz)    Intake/Output:   Intake/Output Summary (Last 24 hours) at 04/28/13 1420 Last data filed at 04/28/13 1300  Gross per 24 hour  Intake   1000 ml  Output   2750 ml  Net  -1750 ml     Physical Exam: General:  Elderly woman lying in bed.  No resp difficulty HEENT: normal Neck: supple. JVP 7 . Carotids 2+ bilat; no bruits. No lymphadenopathy or thryomegaly appreciated. Cor: PMI laterally displaced. Regular rate & rhythm. +s3. 2/6 AS Lungs: clear Abdomen: soft, nontender, nondistended. No  hepatosplenomegaly. No bruits or masses. Good bowel sounds. Extremities: no cyanosis, clubbing, rash, tr-1+ edema Neuro: alert & orientedx3, cranial nerves grossly intact. moves all 4 extremities w/o difficulty. Affect pleasant  Telemetry:  SR 90s  Labs: Basic Metabolic Panel:  Recent Labs Lab 04/26/13 2130 04/27/13 0430 04/27/13 1902 04/28/13 0618  NA 134*  --  136* 138  K 4.3  --  3.7 3.4*  CL 95*  --  92* 95*  CO2 29  --  33* 30  GLUCOSE 117*  --  179* 93  BUN 21  --  19 16  CREATININE 0.79  --  0.82 0.67  CALCIUM 9.0  --  8.8 8.7  MG  --  1.9  --   --  Liver Function Tests:  Recent Labs Lab 04/26/13 2130  AST 27  ALT 26  ALKPHOS 47  BILITOT 0.5  PROT 6.3  ALBUMIN 2.8*   No results found for this basename: LIPASE, AMYLASE,  in the last 168 hours No results found for this basename: AMMONIA,  in the last 168 hours  CBC:  Recent Labs Lab 04/26/13 2130  WBC 5.4  NEUTROABS 2.3  HGB 13.2  HCT 39.3  MCV 86.2  PLT 153    Cardiac Enzymes:  Recent Labs Lab 04/26/13 2130 04/27/13 0430 04/27/13 0840 04/27/13 1902  TROPONINI <0.30 0.32* 0.32* 0.30*    BNP: BNP (last 3 results)  Recent Labs  04/26/13 2130  PROBNP 7233.0*   Digoxin < 0.3  CBG: No results found for this basename: GLUCAP,  in the last 168 hours  Coagulation Studies: No results found for this basename: LABPROT, INR,  in the last 72 hours  Other results: EKG: SR 87. ILBBB. 1AVB 252ms. +PVCs. No acute ST-T changes  Imaging: Dg Chest 2 View  04/26/2013   CLINICAL DATA:  Arrhythmia.  EXAM: CHEST  2 VIEW  COMPARISON:  None.  FINDINGS: Cardiac silhouette markedly enlarged. Thoracic aorta tortuous and atherosclerotic. Hilar and mediastinal contours otherwise unremarkable. Linear scar or atelectasis in the inferior left upper lobe. Mild pulmonary venous hypertension without overt edema. Lungs otherwise clear. No confluent airspace consolidation. Bronchovascular markings normal.  Blunting of the left costophrenic angle. Degenerative changes involving the thoracic spine.  IMPRESSION: 1. Marked cardiomegaly. Pulmonary venous hypertension without overt edema. 2. Blunting the left costophrenic angle which may be due to a small left pleural effusion or chronic pleuroparenchymal scarring. 3. Linear atelectasis or scar in the inferior left upper lobe. No acute cardiopulmonary disease otherwise.   Electronically Signed   By: Hulan Saashomas  Lawrence M.D.   On: 04/26/2013 22:18      Medications:     Current Medications: . aspirin EC  81 mg Oral Daily  . atorvastatin  40 mg Oral q1800  . digoxin  0.125 mg Oral Daily  . furosemide  40 mg Intravenous BID  . heparin  5,000 Units Subcutaneous 3 times per day  . metoprolol succinate  100 mg Oral Daily  . sodium chloride  3 mL Intravenous Q12H  . sodium chloride  3 mL Intravenous Q12H     Infusions:      Assessment:   1. A/c systolic HF with biventricular failure     -LVEF 10-15% 2. Aortic stenosis 3. Hypokalemia 4. AF    --off coumadin due to vaginal bleeding 5. HTN 6. HL 7. Hypokalemia   Plan/Discussion:    Volume status much improved. Suspect she has severe NICM. Now with profound biventricular dysfunction though she reports only NYHA III symptoms. Will switch lasix to po. Do not think I would pursue cardiac cath at this time given age and known NICM. Resume ACE and follow BP and renal function closely. Supp K+  Check SPEP/UPEP.   She does not have ICD but I worry that her prognosis may be quite poor and may not be appropriate given her age. I will d/w Dr. Katrinka BlazingSmith.    Length of Stay: 1  Holly BodilyDaniel BensimhonMD 04/28/2013, 2:20 PM  Advanced Heart Failure Team Pager [phone] (M-F; 7a - 4p)  Please contact [facility] for night-coverage after hours (4p -7a ) and weekends on [email or web address]

## 2013-04-29 DIAGNOSIS — I509 Heart failure, unspecified: Secondary | ICD-10-CM

## 2013-04-29 LAB — BASIC METABOLIC PANEL
BUN: 12 mg/dL (ref 6–23)
CALCIUM: 8.8 mg/dL (ref 8.4–10.5)
CO2: 33 meq/L — AB (ref 19–32)
Chloride: 98 mEq/L (ref 96–112)
Creatinine, Ser: 0.71 mg/dL (ref 0.50–1.10)
GFR calc Af Amer: 89 mL/min — ABNORMAL LOW (ref >90)
GFR calc non Af Amer: 77 mL/min — ABNORMAL LOW (ref >90)
GLUCOSE: 88 mg/dL (ref 70–99)
Potassium: 4 mEq/L (ref 3.7–5.3)
Sodium: 140 mEq/L (ref 137–147)

## 2013-04-29 MED ORDER — LISINOPRIL 10 MG PO TABS
10.0000 mg | ORAL_TABLET | Freq: Once | ORAL | Status: DC
  Filled 2013-04-29: qty 1

## 2013-04-29 MED ORDER — LISINOPRIL 20 MG PO TABS: 20.0000 mg | ORAL_TABLET | Freq: Every day | ORAL | Status: DC

## 2013-04-29 MED ORDER — LISINOPRIL 10 MG PO TABS: 10.0000 mg | ORAL_TABLET | Freq: Once | ORAL | Status: DC

## 2013-04-29 MED ORDER — FUROSEMIDE 40 MG PO TABS: 40.0000 mg | ORAL_TABLET | Freq: Two times a day (BID) | ORAL | Status: DC

## 2013-04-29 MED ORDER — POTASSIUM CHLORIDE ER 20 MEQ PO TBCR: 20.0000 meq | EXTENDED_RELEASE_TABLET | Freq: Every day | ORAL | Status: DC

## 2013-04-29 MED ORDER — LISINOPRIL 10 MG PO TABS: 10.0000 mg | ORAL_TABLET | Freq: Every day | ORAL | Status: DC

## 2013-04-29 MED ORDER — ATORVASTATIN CALCIUM 40 MG PO TABS: 40.0000 mg | ORAL_TABLET | Freq: Every day | ORAL | Status: DC

## 2013-04-29 NOTE — Discharge Instructions (Signed)
Heart Failure °Heart failure is a condition in which the heart has trouble pumping blood. This means your heart does not pump blood efficiently for your body to work well. In some cases of heart failure, fluid may back up into your lungs or you may have swelling (edema) in your lower legs. Heart failure is usually a long-term (chronic) condition. It is important for you to take good care of yourself and follow your caregiver's treatment plan. °CAUSES  °Some health conditions can cause heart failure. Those health conditions include: °· High blood pressure (hypertension) causes the heart muscle to work harder than normal. When pressure in the blood vessels is high, the heart needs to pump (contract) with more force in order to circulate blood throughout the body. High blood pressure eventually causes the heart to become stiff and weak. °· Coronary artery disease (CAD) is the buildup of cholesterol and fat (plaque) in the arteries of the heart. The blockage in the arteries deprives the heart muscle of oxygen and blood. This can cause chest pain and may lead to a heart attack. High blood pressure can also contribute to CAD. °· Heart attack (myocardial infarction) occurs when 1 or more arteries in the heart become blocked. The loss of oxygen damages the muscle tissue of the heart. When this happens, part of the heart muscle dies. The injured tissue does not contract as well and weakens the heart's ability to pump blood. °· Abnormal heart valves can cause heart failure when the heart valves do not open and close properly. This makes the heart muscle pump harder to keep the blood flowing. °· Heart muscle disease (cardiomyopathy or myocarditis) is damage to the heart muscle from a variety of causes. These can include drug or alcohol abuse, infections, or unknown reasons. These can increase the risk of heart failure. °· Lung disease makes the heart work harder because the lungs do not work properly. This can cause a strain  on the heart, leading it to fail. °· Diabetes increases the risk of heart failure. High blood sugar contributes to high fat (lipid) levels in the blood. Diabetes can also cause slow damage to tiny blood vessels that carry important nutrients to the heart muscle. When the heart does not get enough oxygen and food, it can cause the heart to become weak and stiff. This leads to a heart that does not contract efficiently. °· Other conditions can contribute to heart failure. These include abnormal heart rhythms, thyroid problems, and low blood counts (anemia). °Certain unhealthy behaviors can increase the risk of heart failure. Those unhealthy behaviors include: °· Being overweight. °· Smoking or chewing tobacco. °· Eating foods high in fat and cholesterol. °· Abusing illicit drugs or alcohol. °· Lacking physical activity. °SYMPTOMS  °Heart failure symptoms may vary and can be hard to detect. Symptoms may include: °· Shortness of breath with activity, such as climbing stairs. °· Persistent cough. °· Swelling of the feet, ankles, legs, or abdomen. °· Unexplained weight gain. °· Difficulty breathing when lying flat (orthopnea). °· Waking from sleep because of the need to sit up and get more air. °· Rapid heartbeat. °· Fatigue and loss of energy. °· Feeling lightheaded, dizzy, or close to fainting. °· Loss of appetite. °· Nausea. °· Increased urination during the night (nocturia). °DIAGNOSIS  °A diagnosis of heart failure is based on your history, symptoms, physical examination, and diagnostic tests. °Diagnostic tests for heart failure may include: °· Echocardiography. °· Electrocardiography. °· Chest X-ray. °· Blood tests. °· Exercise   stress test. °· Cardiac angiography. °· Radionuclide scans. °TREATMENT  °Treatment is aimed at managing the symptoms of heart failure. Medicines, behavioral changes, or surgical intervention may be necessary to treat heart failure. °· Medicines to help treat heart failure may  include: °· Angiotensin-converting enzyme (ACE) inhibitors. This type of medicine blocks the effects of a blood protein called angiotensin-converting enzyme. ACE inhibitors relax (dilate) the blood vessels and help lower blood pressure. °· Angiotensin receptor blockers. This type of medicine blocks the actions of a blood protein called angiotensin. Angiotensin receptor blockers dilate the blood vessels and help lower blood pressure. °· Water pills (diuretics). Diuretics cause the kidneys to remove salt and water from the blood. The extra fluid is removed through urination. This loss of extra fluid lowers the volume of blood the heart pumps. °· Beta blockers. These prevent the heart from beating too fast and improve heart muscle strength. °· Digitalis. This increases the force of the heartbeat. °· Healthy behavior changes include: °· Obtaining and maintaining a healthy weight. °· Stopping smoking or chewing tobacco. °· Eating heart healthy foods. °· Limiting or avoiding alcohol. °· Stopping illicit drug use. °· Physical activity as directed by your caregiver. °· Surgical treatment for heart failure may include: °· A procedure to open blocked arteries, repair damaged heart valves, or remove damaged heart muscle tissue. °· A pacemaker to improve heart muscle function and control certain abnormal heart rhythms. °· An internal cardioverter defibrillator to treat certain serious abnormal heart rhythms. °· A left ventricular assist device to assist the pumping ability of the heart. °HOME CARE INSTRUCTIONS  °· Take your medicine as directed by your caregiver. Medicines are important in reducing the workload of your heart, slowing the progression of heart failure, and improving your symptoms. °· Do not stop taking your medicine unless directed by your caregiver. °· Do not skip any dose of medicine. °· Refill your prescriptions before you run out of medicine. Your medicines are needed every day. °· Take over-the-counter  medicine only as directed by your caregiver or pharmacist. °· Engage in moderate physical activity if directed by your caregiver. Moderate physical activity can benefit some people. The elderly and people with severe heart failure should consult with a caregiver for physical activity recommendations. °· Eat heart healthy foods. Food choices should be free of trans fat and low in saturated fat, cholesterol, and salt (sodium). Healthy choices include fresh or frozen fruits and vegetables, fish, lean meats, legumes, fat-free or low-fat dairy products, and whole grain or high fiber foods. Talk to a dietitian to learn more about heart healthy foods. °· Limit sodium if directed by your caregiver. Sodium restriction may reduce symptoms of heart failure in some people. Talk to a dietitian to learn more about heart healthy seasonings. °· Use healthy cooking methods. Healthy cooking methods include roasting, grilling, broiling, baking, poaching, steaming, or stir-frying. Talk to a dietitian to learn more about healthy cooking methods. °· Limit fluids if directed by your caregiver. Fluid restriction may reduce symptoms of heart failure in some people. °· Weigh yourself every day. Daily weights are important in the early recognition of excess fluid. You should weigh yourself every morning after you urinate and before you eat breakfast. Wear the same amount of clothing each time you weigh yourself. Record your daily weight. Provide your caregiver with your weight record. °· Monitor and record your blood pressure if directed by your caregiver. °· Check your pulse if directed by your caregiver. °· Lose weight if directed   by your caregiver. Weight loss may reduce symptoms of heart failure in some people. °· Stop smoking or chewing tobacco. Nicotine makes your heart work harder by causing your blood vessels to constrict. Do not use nicotine gum or patches before talking to your caregiver. °· Schedule and attend follow-up visits as  directed by your caregiver. It is important to keep all your appointments. °· Limit alcohol intake to no more than 1 drink per day for nonpregnant women and 2 drinks per day for men. Drinking more than that is harmful to your heart. Tell your caregiver if you drink alcohol several times a week. Talk with your caregiver about whether alcohol is safe for you. If your heart has already been damaged by alcohol or you have severe heart failure, drinking alcohol should be stopped completely. °· Stop illicit drug use. °· Stay up-to-date with immunizations. It is especially important to prevent respiratory infections through current pneumococcal and influenza immunizations. °· Manage other health conditions such as hypertension, diabetes, thyroid disease, or abnormal heart rhythms as directed by your caregiver. °· Learn to manage stress. °· Plan rest periods when fatigued. °· Learn strategies to manage high temperatures. If the weather is extremely hot: °· Avoid vigorous physical activity. °· Use air conditioning or fans or seek a cooler location. °· Avoid caffeine and alcohol. °· Wear loose-fitting, lightweight, and light-colored clothing. °· Learn strategies to manage cold temperatures. If the weather is extremely cold: °· Avoid vigorous physical activity. °· Layer clothes. °· Wear mittens or gloves, a hat, and a scarf when going outside. °· Avoid alcohol. °· Obtain ongoing education and support as needed. °· Participate or seek rehabilitation as needed to maintain or improve independence and quality of life. °SEEK MEDICAL CARE IF:  °· Your weight increases by 03 lb/1.4 kg in 1 day or 05 lb/2.3 kg in a week. °· You have increasing shortness of breath that is unusual for you. °· You are unable to participate in your usual physical activities. °· You tire easily. °· You cough more than normal, especially with physical activity. °· You have any or more swelling in areas such as your hands, feet, ankles, or abdomen. °· You  are unable to sleep because it is hard to breathe. °· You feel like your heart is beating fast (palpitations). °· You become dizzy or lightheaded upon standing up. °SEEK IMMEDIATE MEDICAL CARE IF:  °· You have difficulty breathing. °· There is a change in mental status such as decreased alertness or difficulty with concentration. °· You have a pain or discomfort in your chest. °· You have an episode of fainting (syncope). °MAKE SURE YOU:  °· Understand these instructions. °· Will watch your condition. °· Will get help right away if you are not doing well or get worse. °Document Released: 01/24/2005 Document Revised: 05/21/2012 Document Reviewed: 02/16/2012 °[employer]® Patient Information ©2014 [employer]. ° °

## 2013-04-29 NOTE — Consult Note (Signed)
Heart Failure Navigator Consult Note  Presentation: Jean Moore  is most distressed about her LE edema, which has been progressing for 1 week. She also has slowly progressive DOE and orthopnea for 2-3 weeks. She has noticed her weight increasing since the holidays. She has known chronic systolic heart failure, with last EF of 15 -20 %, mgmt by Jean Moore. Normally, her activities are not limited by dyspnea.    Past Medical History  Diagnosis Date  . Atrial fibrillation     on coumadin since 2012, recently stopped because of vaginal bleeding  . Hypertension   . Hyperlipidemia   . Routine adult health maintenance 04/09/2012  . CHF (congestive heart failure)     History   Social History  . Marital Status: Widowed    Spouse Name: N/A    Number of Children: N/A  . Years of Education: N/A   Social History Main Topics  . Smoking status: Never Smoker   . Smokeless tobacco: None  . Alcohol Use: No  . Drug Use: No  . Sexual Activity: None   Other Topics Concern  . None   Social History Narrative  . None    ECHO:Study Conclusions-- 04/27/13  - Left ventricle: The cavity size was severely dilated. Wall thickness was increased in a pattern of mild LVH. Systolic function was severely reduced. The estimated ejection fraction was in the range of 10% to 15%. Dyskinesis of the anteroseptal myocardium. There was fusion of early and atrial contributions to ventricular filling. - Aortic valve: There was mild stenosis. Valve area: 1.02cm^2(VTI). Valve area: 1.25cm^2 (Vmax). - Mitral valve: Mildly calcified annulus. Mild regurgitation. - Left atrium: The atrium was moderately dilated. - Right ventricle: The cavity size was moderately dilated. Systolic function was moderately reduced. - Right atrium: The atrium was moderately dilated. - Tricuspid valve: Moderate regurgitation. - Pulmonary arteries: Systolic pressure was moderately to severely increased. PA peak pressure: 62mm Hg (S). -  Pericardium, extracardiac: A small pericardial effusion was identified along the left ventricular free wall.   BNP    Component Value Date/Time   PROBNP 7233.0* 04/26/2013 2130    Education Assessment and Provision:  Detailed education and instructions provided on heart failure disease management including the following:  Signs and symptoms of Heart Failure When to call the physician Importance of daily weights Low sodium diet  Fluid restriction Medication management Anticipated future follow-up appointments  Patient education given on each of the above topics.  Patient and son acknowledge understanding and acceptance of all instructions.  She admits to not weighing daily however says she does have a scale and can weigh daily without problem.  I stressed the importance of daily weights as a tool to help her manage her heart failure.  She also admits to using salt in cooking --she likes her food "seasoned so I can taste it".  We reviewed foods high in sodium and discussed some alternatives to salt for seasoning.  I will have a dietician come to provide more in depth low sodium dietary edcucation.  Her son says that he will assist her in grocery shopping and help with better choices.    Education Materials:  "Living Better With Heart Failure" Booklet, Daily Weight Tracker Tool and Heart Failure Educational Video.   High Risk Criteria for Readmission and/or Poor Patient Outcomes:   EF <30%- Yes  2 or more admissions in 6 months- Yes  Difficult social situation- No  Demonstrates medication noncompliance- No  Barriers of Care: Patient's  understanding and knowledge of her medical conditions.   Discharge Planning:  Jean Moore lives alone and has children that live locally and visit often.  She has been quite active until just recently when she began having more SOB and swelling in bilat. lower ext.

## 2013-04-29 NOTE — Progress Notes (Signed)
Advanced Heart Failure Rounding Note   Subjective:   80 y/o woman with h/o HTN, HL, AF and systolic HF with EF 20% followed by Dr. Katrinka Blazing. Admitted with recurrent HF. Consult called due to worsening EF.   Has been followed by Dr. Katrinka Blazing for her HF. EF previously reported at 20%. Says she had a cath a long time ago but doesn't remember result. Admitted with recurrent HF and echo with EF 10-15% with mild AS (may be underestimated). Moderate RV dysfunction. Prior to admit she was living alone and able to perform all ADLs.   Yesterday she was started on ace and switched to po lasix. Weight down 16 pounds. Denies SOB/Orthopnea.       Objective:   Weight Range:  Vital Signs:   Temp:  [97.6 F (36.4 C)-98.3 F (36.8 C)] 98.3 F (36.8 C) (03/23 0528) Pulse Rate:  [89-96] 94 (03/23 0528) Resp:  [18-19] 18 (03/23 0528) BP: (96-140)/(60-83) 140/83 mmHg (03/23 0528) SpO2:  [98 %-100 %] 98 % (03/23 0528) Weight:  [151 lb (68.493 kg)] 151 lb (68.493 kg) (03/23 0528) Last BM Date: 04/28/13  Weight change: Filed Weights   04/27/13 0236 04/28/13 0444 04/29/13 0528  Weight: 164 lb 3.2 oz (74.481 kg) 154 lb 6.4 oz (70.035 kg) 151 lb (68.493 kg)    Intake/Output:   Intake/Output Summary (Last 24 hours) at 04/29/13 1054 Last data filed at 04/29/13 0900  Gross per 24 hour  Intake    803 ml  Output   1950 ml  Net  -1147 ml  Physical Exam:  General: Elderly woman sitting on the side of the bed. No resp difficulty . Daughter and son present.  HEENT: normal  Neck: supple. JVP 5-6. Carotids 2+ bilat; no bruits. No lymphadenopathy or thryomegaly appreciated.  Cor: PMI laterally displaced. Regular rate & rhythm. +s3. 2/6 AS  Lungs: clear  Abdomen: soft, nontender, nondistended. No hepatosplenomegaly. No bruits or masses. Good bowel sounds.  Extremities: no cyanosis, clubbing, rash, tr+ edema  Neuro: alert & orientedx3, cranial nerves grossly intact. moves all 4 extremities w/o difficulty. Affect  pleasant  Telemetry: SR 90s   Labs: Basic Metabolic Panel:  Recent Labs Lab 04/26/13 2130 04/27/13 0430 04/27/13 1902 04/28/13 0618 04/29/13 0455  NA 134*  --  136* 138 140  K 4.3  --  3.7 3.4* 4.0  CL 95*  --  92* 95* 98  CO2 29  --  33* 30 33*  GLUCOSE 117*  --  179* 93 88  BUN 21  --  19 16 12   CREATININE 0.79  --  0.82 0.67 0.71  CALCIUM 9.0  --  8.8 8.7 8.8  MG  --  1.9  --   --   --     Liver Function Tests:  Recent Labs Lab 04/26/13 2130  AST 27  ALT 26  ALKPHOS 47  BILITOT 0.5  PROT 6.3  ALBUMIN 2.8*   No results found for this basename: LIPASE, AMYLASE,  in the last 168 hours No results found for this basename: AMMONIA,  in the last 168 hours  CBC:  Recent Labs Lab 04/26/13 2130  WBC 5.4  NEUTROABS 2.3  HGB 13.2  HCT 39.3  MCV 86.2  PLT 153    Cardiac Enzymes:  Recent Labs Lab 04/26/13 2130 04/27/13 0430 04/27/13 0840 04/27/13 1902  TROPONINI <0.30 0.32* 0.32* 0.30*    BNP: BNP (last 3 results)  Recent Labs  04/26/13 2130  PROBNP 7233.0*  Imaging:  No results found.   Medications:     Scheduled Medications: . aspirin EC  81 mg Oral Daily  . atorvastatin  40 mg Oral q1800  . digoxin  0.125 mg Oral Daily  . furosemide  40 mg Oral BID  . heparin  5,000 Units Subcutaneous 3 times per day  . lisinopril  10 mg Oral Daily  . metoprolol succinate  100 mg Oral Daily  . sodium chloride  3 mL Intravenous Q12H  . sodium chloride  3 mL Intravenous Q12H     Infusions:     PRN Medications:  sodium chloride, sodium chloride   Assessment:   1. A/c systolic HF with biventricular failure  -LVEF 10-15%  2. Aortic stenosis  3. Hypokalemia  4. AF  --off coumadin due to vaginal bleeding  5. HTN  6. HL  7. Hypokalemia  8. LBBB  Plan/Discussion:    Volume status stable. Continue lasix 40 mg twice a day. Continue metoprolol succinate 100 mg daily, digoxin 0.125 mg daily,  and lisinopril 10 mg daily. Will not  increase Ace as BP has been low. Discussed the need for daily weights, limiting fluid intake to < 2 liters per day, and low salt food choices.   SPEP/UPEP pending.   Consult cardiac rehab.    Length of Stay: 2 CLEGG,AMY NP-C  04/29/2013, 10:54 AM  Advanced Heart Failure Team Pager [phone] (M-F; 7a - 4p)  Please contact [facility] for night-coverage after hours (4p -7a ) and weekends on [email or web address]  Patient seen and examined with Tonye BecketAmy Clegg, NP. We discussed all aspects of the encounter. I agree with the assessment and plan as stated above.   She looks very good. Now euvolemic. Long talk with her and her daughter and son about ICD and CRT and she clearly refuses. Will plan d/c today on following HF meds:  Lasix 40 bid Toprol 100 daily Lisinopril 10 daily (decrease from 20 on admit) Digoxin 0.125 daily Kcl 20 daily  F/u next Monday with Dr. Katrinka BlazingSmith or PA. Check BMET and digoxin level. She is not interested in the HF Clinic at this point.   Daniel Bensimhon,MD 12:50 PM

## 2013-04-29 NOTE — Plan of Care (Cosign Needed)
Problem: Undesirable Food Choices (NB-1.7) Goal: Nutrition education Formal process to instruct or train a patient/client in a skill or to impart knowledge to help patients/clients voluntarily manage or modify food choices and eating behavior to maintain or improve health. Outcome: Completed/Met Date Met:  04/29/13 Nutrition Education Note  Dietetic Intern consulted for diet education regarding new onset CHF.  Dietetic Intern provided "Heart Failure Nutrition Therapy" and "Sodium Free Flavoring Tips" from the Academy of [employer]etics. Reviewed dietary recall. Encouraged patient to get rid of salt shaker and start reading nutrition labels. Discussed increasing intake of fresh/frozen fruits and vegetables with no sodium. Encouraged patient to cook foods at home using sodium free flavorings. Discouraged intake of processed and restaurant foods. Encouraged patient to monitor fluid intake.   Explained the relationship between sodium and fluid in the body. Patient was receptive to diet education. Patient and family members described dietary changes they were willing to make and asked appropriate questions. Teach back method used.   Expect good compliance.  Body mass index is 26.76 kg/(m^2).  Patient meets criteria for overweight based on current BMI.  Current diet order is low sodium/heart healthy. Patient is consuming approximately 50-100% of meals at this time. Labs and medications reviewed. No further nutrition interventions warranted at this time. Please re-consult RD if additional nutrition issues arise.  Claudell Kyle, Dietetic Intern Pager: [phone]

## 2013-04-29 NOTE — Progress Notes (Signed)
Subjective:  NAEON. Tolerating oral lasix well. Patient requesting to be discharged.  Objective: Vital signs in last 24 hours: Filed Vitals:   04/28/13 1057 04/28/13 1350 04/28/13 2147 04/29/13 0528  BP: 96/60 119/74 117/69 140/83  Pulse:  96 89 94  Temp:  97.6 F (36.4 C) 98.1 F (36.7 C) 98.3 F (36.8 C)  TempSrc:  Oral Oral Oral  Resp:  18 19 18   Height:      Weight:    151 lb (68.493 kg)  SpO2:  100% 100% 98%   Weight change: -3 lb 6.4 oz (-1.542 kg)  Intake/Output Summary (Last 24 hours) at 04/29/13 0710 Last data filed at 04/29/13 [id number]  Gross per 24 hour  Intake    563 ml  Output   1950 ml  Net  -1387 ml   Physical Exam  Constitutional: She appears well-developed and well-nourished.  HENT:  Head: Normocephalic.  Mouth/Throat: Oropharynx is clear and moist. No oropharyngeal exudate.  Cardiovascular: Normal rate and intact distal pulses.   Murmur heard. 3/6 systolic murmur loudest on the RUSB  Pulmonary/Chest: Effort normal.  Mild basilar rales  Musculoskeletal: She exhibits edema.  Trace edema in LLE, and 1+ edema in RLE to knee. Patient states this is the normal appearance of her legs.   Psychiatric: She has a normal mood and affect. Her behavior is normal.    Lab Results: Basic Metabolic Panel:  Recent Labs Lab 04/26/13 2130 04/27/13 0430  04/28/13 0618 04/29/13 0455  NA 134*  --   < > 138 140  K 4.3  --   < > 3.4* 4.0  CL 95*  --   < > 95* 98  CO2 29  --   < > 30 33*  GLUCOSE 117*  --   < > 93 88  BUN 21  --   < > 16 12  CREATININE 0.79  --   < > 0.67 0.71  CALCIUM 9.0  --   < > 8.7 8.8  MG  --  1.9  --   --   --   < > = values in this interval not displayed. Liver Function Tests:  Recent Labs Lab 04/26/13 2130  AST 27  ALT 26  ALKPHOS 47  BILITOT 0.5  PROT 6.3  ALBUMIN 2.8*   CBC:  Recent Labs Lab 04/26/13 2130  WBC 5.4  NEUTROABS 2.3  HGB 13.2  HCT 39.3  MCV 86.2  PLT 153   Cardiac Enzymes:  Recent Labs Lab  04/27/13 0430 04/27/13 0840 04/27/13 1902  TROPONINI 0.32* 0.32* 0.30*   BNP:  Recent Labs Lab 04/26/13 2130  PROBNP 7233.0*   Thyroid Function Tests:  Recent Labs Lab 04/27/13 0430  TSH 0.838   Studies/Results: No results found. Medications: I have reviewed the patient's current medications. Scheduled Meds: . aspirin EC  81 mg Oral Daily  . atorvastatin  40 mg Oral q1800  . digoxin  0.125 mg Oral Daily  . furosemide  40 mg Oral BID  . heparin  5,000 Units Subcutaneous 3 times per day  . lisinopril  10 mg Oral Daily  . metoprolol succinate  100 mg Oral Daily  . sodium chloride  3 mL Intravenous Q12H  . sodium chloride  3 mL Intravenous Q12H   Continuous Infusions:  PRN Meds:.sodium chloride, sodium chloride Assessment/Plan: Principal Problem:   Acute on chronic systolic heart failure Active Problems:   HYPERTENSION   Atrial fibrillation   CHF (congestive heart failure)  Acute CHF exacerbation-   Patient much improved and transitioned to PO lasix. Plan for discharge later today pending card rec's regarding lifevest and ICD requirement. -Continue Lasix per heart failure team -trend bmet  -Daily weights  -Strict I/O's  -Continue home meds; Toprol XL, Digoxin, ASA  - started statin  Atrial Fibrillation- Patient w/ h/o atrial fibrillation, on Toprol XL 100 mg po qd + Digoxin 0.125 mg po qd at home. Currently, patient is NSR -Continue home meds   HTN- Patient normotensive on exam. On Lisinopril-HCTZ + Toprol XL at home.  -Hold Prinzide for now  -Lasix as above  -Toprol XL as above   HLD- Most current lipid panel from 2009; total cholesterol of 248, LDL of 158. Not on a statin at home  - start statin  Dispo: Anticipate d/c today.  The patient does have a current PCP Inez Catalina, MD) and does need an [facility] hospital follow-up appointment after discharge.  The patient does not know have transportation limitations that hinder transportation to clinic  appointments.  .Services Needed at time of discharge: Y = Yes, Blank = No PT:   OT:   RN:   Equipment:   Other:     LOS: 2 days   Pleas Koch, MD 04/29/2013, 7:10 AM

## 2013-04-29 NOTE — Progress Notes (Signed)
    Day 2 of stay      Patient name: Jean Moore  Medical record number: [id number]  Date of birth: 09-20-1926  [name], 80 year old lady admitted for systolic CHF (echo on 3/21 shows EF 10-15%). Done well on conservative management. Output 4.5 litres of fluid to date. She feels clinically much improved today. Stable vitals, exam significant for systolic murmur, no rales and 1+ pedal edema. Agree with the treatment plan. I have discussed the care of this patient with my IM resident team. I have read the note by Dr. Caleen Jobs and agree with the documentation.  Lori Liew 04/29/2013, 10:43 AM.

## 2013-04-29 NOTE — Discharge Summary (Signed)
Name: Jean Moore MRN: [id number] DOB: 06-13-1926 80 y.o. PCP: Inez Catalina, MD  Date of Admission: 04/27/2013 12:10 AM Date of Discharge: 04/29/2013 Attending Physician: Burns Spain, MD  Discharge Diagnosis:  Principal Problem:   Acute on chronic systolic heart failure Active Problems:   HYPERTENSION   Atrial fibrillation   CHF (congestive heart failure)  Discharge Medications:   Medication List    STOP taking these medications       lisinopril-hydrochlorothiazide 20-25 MG per tablet  Commonly known as:  PRINZIDE,ZESTORETIC      TAKE these medications       acetaminophen 500 MG tablet  Commonly known as:  TYLENOL  Take 500 mg by mouth every 6 (six) hours as needed for pain.     aspirin 81 MG tablet  Take 81 mg by mouth daily.     atorvastatin 40 MG tablet  Commonly known as:  LIPITOR  Take 1 tablet (40 mg total) by mouth daily at 6 PM.     CENTRUM SILVER ADULT 50+ PO  Take 1 tablet by mouth daily.     digoxin 0.125 MG tablet  Commonly known as:  LANOXIN  Take 0.125 mg by mouth daily.     furosemide 40 MG tablet  Commonly known as:  LASIX  Take 1 tablet (40 mg total) by mouth 2 (two) times daily.     lisinopril 10 MG tablet  Commonly known as:  PRINIVIL,ZESTRIL  Take 1 tablet (10 mg total) by mouth once.     metoprolol succinate 100 MG 24 hr tablet  Commonly known as:  TOPROL-XL  Take 1 tablet (100 mg total) by mouth daily. Take with or immediately following a meal.     Potassium Chloride ER 20 MEQ Tbcr  Take 20 mEq by mouth daily.        Disposition and follow-up:   [name] was discharged from [facility] in Stable condition.  At the hospital follow up visit please address:  1.  CHF, HLD, Medication Compliance.  2.  Labs / imaging needed at time of follow-up: BMP CBC  3.  Pending labs/ test needing follow-up: None  Follow-up Appointments:     Follow-up Information   Follow up with Pleas Koch, MD On 05/15/2013. (2:45 pm)    Specialty:  Internal Medicine   Contact information:   [address] [place] [address] [phone]       Follow up with Tereso Newcomer, PA-C On 05/13/2013. (8:30 am)    Specialty:  Physician Assistant   Contact information:   [address] [place] [address] [phone]       Follow up with [facility], NP On 05/06/2013. (1:30 pm)    Specialty:  Nurse Practitioner   Contact information:   [address] [place] [address] [phone]       Discharge Instructions: Discharge Orders   Future Appointments Provider Department Dept Phone   05/06/2013 1:30 PM [facility] [phone]   05/13/2013 8:30 AM Beatrice Lecher, PA-C [facility] Office [phone]   05/15/2013 2:45 PM Pleas Koch, MD [facility] [phone]   Future Orders Complete By Expires   (HEART FAILURE PATIENTS) Call MD:  Anytime you have any of the following symptoms: 1) 3 pound weight gain in 24 hours or 5 pounds in 1 week 2) shortness of breath, with or without  a dry hacking cough 3) swelling in the hands, feet or stomach 4) if you have to sleep on extra pillows at night in order to breathe.  As directed    Call MD for:  difficulty breathing, headache or visual disturbances  As directed    Call MD for:  extreme fatigue  As directed    Diet - low sodium heart healthy  As directed    Discharge instructions  As directed    Comments:     We have treated you for heart failure. Please take the medications as prescribed. Please follow up with the appointments we made for you.   Increase activity slowly  As directed       Consultations: Treatment Team:  Rounding Lbcardiology, MD  Procedures Performed:  Dg Chest 2 View  04/26/2013   CLINICAL DATA:  Arrhythmia.  EXAM: CHEST  2 VIEW  COMPARISON:  None.  FINDINGS: Cardiac silhouette markedly  enlarged. Thoracic aorta tortuous and atherosclerotic. Hilar and mediastinal contours otherwise unremarkable. Linear scar or atelectasis in the inferior left upper lobe. Mild pulmonary venous hypertension without overt edema. Lungs otherwise clear. No confluent airspace consolidation. Bronchovascular markings normal. Blunting of the left costophrenic angle. Degenerative changes involving the thoracic spine.  IMPRESSION: 1. Marked cardiomegaly. Pulmonary venous hypertension without overt edema. 2. Blunting the left costophrenic angle which may be due to a small left pleural effusion or chronic pleuroparenchymal scarring. 3. Linear atelectasis or scar in the inferior left upper lobe. No acute cardiopulmonary disease otherwise.   Electronically Signed   By: Hulan Saashomas  Lawrence M.D.   On: 04/26/2013 22:18    2D Echo: Left ventricle: The cavity size was severely dilated. Wall thickness was increased in a pattern of mild LVH. Systolic function was severely reduced. The estimated ejection fraction was in the range of 10% to 15%. Dyskinesis of the anteroseptal myocardium. There was fusion of early and atrial contributions to ventricular filling.  Admission HPI: [name] is a 80 y.o. female w/ PMHx of Atrial Fibrillation, HTN, HLD, presents to the ED from urgent care w/ complaints of recent progressive LE swelling. She claims this swelling started in the last week and has become so severe that she is having difficulty wearing her normal pair of shoes. She also notes a recent worsening DOE which began 2-3 weeks ago as well. She is very independent w/ ADL's and IADL's but says she has had trouble vacuuming her whole living room without having to stop to take a rest 2/2 her SOB. She claims she has never had these issues before. She also admits to recent 3 pillow orthopnea, but denies PND. The patient otherwise denies chest pain, dizziness, lightheadedness, palpitations, leg pain, recent surgeries, or prolonged  immobilization. She also denies previous history of DVT or PE and no family history of clotting disorders. She also denies any fever, chills, nausea, vomiting, abdominal pain, diarrhea, or dysuria.   Hospital Course by problem list: Principal Problem:   Acute on chronic systolic heart failure Active Problems:   HYPERTENSION   Atrial fibrillation   CHF (congestive heart failure)   1. AoCHF (systolic) Patient presented w/ new LE swelling, DOE, and orthopnea. Pulmonary exam significant for bibasilar crackles as well as III/VI systolic murmur loudest over the left sternal border. pBNP of 7233 on admission, however, no previous pBNP for comparison. CXR shows marked cardiomegaly, pulmonary venous HTN, w/out overt pulmonary edema, w/ possible small left pleural effusion. Patient had elevated BNP. She diuresed well initially  with IV lasix and was then transitioned to PO lasix. Heart failure team was consulted who provided medication recommendations. She was euvolemic on discharge. Follow up was arranged with PCP, home cardiology, and heart failure clinic.  Discharge Vitals:   BP 140/83  Pulse 94  Temp(Src) 98.3 F (36.8 C) (Oral)  Resp 18  Ht 5\' 3"  (1.6 m)  Wt 151 lb (68.493 kg)  BMI 26.76 kg/m2  SpO2 98%  LMP 10/12/1978  Discharge Labs:  Results for orders placed during the hospital encounter of 04/27/13 (from the past 24 hour(s))  BASIC METABOLIC PANEL     Status: Abnormal   Collection Time    04/29/13  4:55 AM      Result Value Ref Range   Sodium 140  137 - 147 mEq/L   Potassium 4.0  3.7 - 5.3 mEq/L   Chloride 98  96 - 112 mEq/L   CO2 33 (*) 19 - 32 mEq/L   Glucose, Bld 88  70 - 99 mg/dL   BUN 12  6 - 23 mg/dL   Creatinine, Ser 4.49  0.50 - 1.10 mg/dL   Calcium 8.8  8.4 - 20.1 mg/dL   GFR calc non Af Amer 77 (*) >90 mL/min   GFR calc Af Amer 89 (*) >90 mL/min    Signed: Pleas Koch, MD 04/29/2013, 1:09 PM   Time Spent on Discharge: 35 minutes Services Ordered on  Discharge: None Equipment Ordered on Discharge: None

## 2013-04-29 NOTE — Progress Notes (Signed)
Patient evaluated for community based chronic disease management services with [facility] Care Management Program as a benefit of patient's [employer]. Spoke with patient at bedside to explain [facility] Care Management services.  Patient will receive a post discharge transition of care call and will be evaluated for monthly home visits for assessments and CHF disease process education.  Current EF estimated to be 15%-20%.  Patient remains full code but has requested to complete a HCPOA at bedside prior to discharge today.  Dr Gala Romney in at bedside. He has cleared her for discharge.  Informed staff Tammy RN Staff Nurse of his request for patient to be progressed to discharge today and of HCPOA request.  Written consents and authorized contact received.  Left contact information and [facility] literature at bedside. Made Inpatient Case Manager aware that [facility] Care Management following. Of note, [facility] Care Management services does not replace or interfere with any services that are arranged by inpatient case management or social work.  For additional questions or referrals please contact Anibal Henderson BSN RN [facility] Liaison at [phone].

## 2013-04-29 NOTE — Progress Notes (Signed)
CARDIAC REHAB PHASE I   PRE:  Rate/Rhythm: 90 SR  BP:  Supine:   Sitting: 94/50  Standing:    SaO2: 97 RA  MODE:  Ambulation: 460 ft   POST:  Rate/Rhythm: 105  BP:  Supine:   Sitting: 140/60  Standing:    SaO2: 99 RA 1350-1505 Assisted X 1 and used pt's cane to ambulate. Gait steady with cane.Pt able to walk 460 feet without c/o of pain or SOB. VS stable Pt to chair after walk. Reviewed CHF education with pt son and daughter. They voice understanding. Pt agrees to Outpt. [facility] in [place], will send referral. They have watched CHF video.   Melina Copa RN 04/29/2013 3:11 PM

## 2013-04-29 NOTE — Progress Notes (Signed)
Patient verbalized understanding of dischare orders.  Follow up appointments were made.

## 2013-04-29 NOTE — Progress Notes (Signed)
Chaplain responded to referral for advance directive assistance. Left documents with family to read to complete. Followed up an hour later and assisted patient with completing, confirming that patient was competent and of sound mind, understanding fully the decisions made. Located witnesses and requested notary. Was present during notarization. Made copies for patient and left one with patient's RN. Family was very grateful for chaplain assistance.   Maurene Capes [phone]

## 2013-04-30 NOTE — Care Management Note (Addendum)
  Page 1 of 1   04/30/2013     11:12:14 AM   CARE MANAGEMENT NOTE 04/30/2013  Patient:  Jean Moore, Jean Moore   Account Number:  [id number]  Date Initiated:  04/30/2013  Documentation initiated by:  Donato Schultz  Subjective/Objective Assessment:   Admitted with CHF     Action/Plan:   Anticipated DC Date:  04/29/2013   Anticipated DC Plan:  HOME/SELF CARE         Choice offered to / List presented to:             Status of service:  Completed, signed off Medicare Important Message given?   (If response is "NO", the following Medicare IM given date fields will be blank) Date Medicare IM given:   Date Additional Medicare IM given:    Discharge Disposition:  HOME/SELF CARE  Per UR Regulation:  Reviewed for med. necessity/level of care/duration of stay  If discussed at Long Length of Stay Meetings, dates discussed:    Comments:  Patient d/c to home / self care with family 04/29/2013 No CM needs identified. Rayne Loiseau RN, BSN, [place], [place] 04/30/2013

## 2013-05-01 LAB — UIFE/LIGHT CHAINS/TP QN, 24-HR UR
Albumin, U: DETECTED
Alpha 1, Urine: DETECTED — AB
Alpha 2, Urine: DETECTED — AB
BETA UR: DETECTED — AB
FREE LAMBDA LT CHAINS, UR: 0.11 mg/dL (ref 0.02–0.67)
Free Kappa Lt Chains,Ur: 0.62 mg/dL (ref 0.14–2.42)
Free Kappa/Lambda Ratio: 5.64 ratio (ref 2.04–10.37)
Gamma Globulin, Urine: DETECTED — AB
Total Protein, Urine: 1.4 mg/dL

## 2013-05-01 LAB — PROTEIN ELECTROPH W RFLX QUANT IMMUNOGLOBULINS
ALPHA-1-GLOBULIN: 3.5 % (ref 2.9–4.9)
Albumin ELP: 57.7 % (ref 55.8–66.1)
Alpha-2-Globulin: 10.3 % (ref 7.1–11.8)
Beta 2: 5.1 % (ref 3.2–6.5)
Beta Globulin: 8.2 % — ABNORMAL HIGH (ref 4.7–7.2)
Gamma Globulin: 15.2 % (ref 11.1–18.8)
M-Spike, %: NOT DETECTED g/dL
TOTAL PROTEIN ELP: 5.7 g/dL — AB (ref 6.0–8.3)

## 2013-05-03 ENCOUNTER — Telehealth: Payer: Self-pay | Admitting: Interventional Cardiology

## 2013-05-03 NOTE — Telephone Encounter (Signed)
New Message:  Pt's son states he is calling in reference to his mom's Rx Digoxin... States she has not been taking it for this week. Pt would like to be advised on what to do since she has not been taking it.

## 2013-05-03 NOTE — Telephone Encounter (Signed)
returned call to pt son.pt has not been taking digoxin since discharge from [facility] on 04/29/13.adv pt that Dr.Smith was not in the office this afternoon.discussed with Dr.Skains ok for pt to resume taking.pt sts that digoxin was d/c by Dr.Smith confirmed on [facility] d/c 04/2012.pt has an appt with her pcp on 05/06/13.pt and pt son were confused on what was started in the hospital. The pt sts that she will wait to resume digoxin after appt on 05/06/13 w/pcp.

## 2013-05-06 ENCOUNTER — Encounter (HOSPITAL_COMMUNITY): Payer: Self-pay

## 2013-05-06 ENCOUNTER — Ambulatory Visit (HOSPITAL_COMMUNITY)
Admission: RE | Admit: 2013-05-06 | Discharge: 2013-05-06 | Disposition: A | Discharge status: Home / Self Care | Payer: Medicare Other | Source: Ambulatory Visit | Attending: Internal Medicine | Admitting: Internal Medicine

## 2013-05-06 VITALS — BP 116/64 | HR 73 | Resp 18 | Wt 151.1 lb

## 2013-05-06 DIAGNOSIS — I4891 Unspecified atrial fibrillation: Secondary | ICD-10-CM

## 2013-05-06 DIAGNOSIS — I5022 Chronic systolic (congestive) heart failure: Secondary | ICD-10-CM | POA: Insufficient documentation

## 2013-05-06 DIAGNOSIS — I509 Heart failure, unspecified: Secondary | ICD-10-CM

## 2013-05-06 MED ORDER — DIGOXIN 125 MCG PO TABS: 0.1250 mg | ORAL_TABLET | Freq: Every day | ORAL | Status: DC

## 2013-05-06 MED ORDER — ASPIRIN 81 MG PO TABS: 81.0000 mg | ORAL_TABLET | Freq: Every day | ORAL | Status: AC

## 2013-05-06 MED ORDER — FUROSEMIDE 40 MG PO TABS: 40.0000 mg | ORAL_TABLET | Freq: Two times a day (BID) | ORAL | Status: DC

## 2013-05-06 MED ORDER — POTASSIUM CHLORIDE ER 20 MEQ PO TBCR: 20.0000 meq | EXTENDED_RELEASE_TABLET | Freq: Every day | ORAL | Status: DC

## 2013-05-06 MED ORDER — LISINOPRIL 10 MG PO TABS: 10.0000 mg | ORAL_TABLET | Freq: Every day | ORAL | Status: DC

## 2013-05-06 MED ORDER — ATORVASTATIN CALCIUM 40 MG PO TABS
40.0000 mg | ORAL_TABLET | Freq: Every day | ORAL | Status: DC
Start: 2013-05-06 — End: 2013-05-27

## 2013-05-06 MED ORDER — METOPROLOL SUCCINATE ER 100 MG PO TB24
100.0000 mg | ORAL_TABLET | Freq: Every day | ORAL | Status: DC
Start: 2013-05-06 — End: 2014-07-29

## 2013-05-06 NOTE — Progress Notes (Signed)
Patient ID: Jean Moore, female   DOB: November 08, 1926, 80 y.o.   MRN: [id number]  Weight Range   Baseline proBNP   Cardiology: Dr Katrinka Blazing  PCP: Dr Rogelia Boga   HPI: 79 y/o woman with h/o HTN, HL, AF and systolic HF with EF 10-15% Moderate RV dysfunction followed by Dr. Katrinka Blazing. Admitted with recurrent HF on 04/27/13. She was diuresed with IV lasix and transitioned to lasix 40 mg twice a day.  Discharge weight was 151 pounds.   She returns for post hospital follow up. Overall she is feeling good. Denies SOB/PND/Orthopnea. She is exercsing on a stationary bike  5 minutes 3 times a day. Able to vacuum. Weight at home 146-148 pounds. Taking all medications except digoxin she was confused about her medications.   Labs 04/29/13 K 4.0 Creatinine 0.71  ROS: All systems negative except as listed in HPI, PMH and Problem List.  Past Medical History  Diagnosis Date  . Atrial fibrillation     on coumadin since 2012, recently stopped because of vaginal bleeding  . Hypertension   . Hyperlipidemia   . Routine adult health maintenance 04/09/2012  . CHF (congestive heart failure)     Current Outpatient Prescriptions  Medication Sig Dispense Refill  . acetaminophen (TYLENOL) 500 MG tablet Take 500 mg by mouth every 6 (six) hours as needed for pain.      Marland Kitchen aspirin 81 MG tablet Take 81 mg by mouth daily.      Marland Kitchen atorvastatin (LIPITOR) 40 MG tablet Take 1 tablet (40 mg total) by mouth daily at 6 PM.  30 tablet  0  . furosemide (LASIX) 40 MG tablet Take 1 tablet (40 mg total) by mouth 2 (two) times daily.  60 tablet  0  . lisinopril (PRINIVIL,ZESTRIL) 10 MG tablet Take 1 tablet (10 mg total) by mouth daily.  30 tablet  0  . metoprolol succinate (TOPROL-XL) 100 MG 24 hr tablet Take 1 tablet (100 mg total) by mouth daily. Take with or immediately following a meal.  30 tablet  5  . Multiple Vitamins-Minerals (CENTRUM SILVER ADULT 50+ PO) Take 1 tablet by mouth daily.      . potassium chloride 20 MEQ TBCR Take 20 mEq by  mouth daily.  30 tablet  0  . digoxin (LANOXIN) 0.125 MG tablet Take 0.125 mg by mouth daily.       No current facility-administered medications for this encounter.     PHYSICAL EXAM: Filed Vitals:   05/06/13 1330  BP: 116/64  Pulse: 73  Resp: 18  Weight: 151 lb 2 oz (68.55 kg)  SpO2: 93%    Physical Exam:  General: Elderly. NAD. Daughter and son present.   HEENT: normal  Neck: supple. JVP 5-6  Carotids 2+ bilat; no bruits. No lymphadenopathy or thryomegaly appreciated.  Cor: PMI laterally displaced. Regular rate & rhythm. +s3. 2/6 AS  Lungs: clear  Abdomen: soft, nontender, nondistended. No hepatosplenomegaly. No bruits or masses. Good bowel sounds.  Extremities: no cyanosis, clubbing, rash, edema  Neuro: alert & orientedx3, cranial nerves grossly intact. moves all 4 extremities w/o difficulty. Affect pleasant        ASSESSMENT & PLAN:  1. Chronic Systolic Heart Failure -Biventricular Heart Failure EF 10-15 % RV mod dysfunction.  Reviewed discharge summary. NYHA II-IIII. Volume status stable. Continue lasix 40 mg twice a day. Continue metoprolol succinate 100 mg daily and restart digoxin 0.125 mg daily. Continue lisinopril 10 mg daily. Refer back to Dr Katrinka Blazing for ongoing HF  management per patient request.  2.A fib - Rate controlled. not on coumadin due to vaginal bleeding.   Follow up Dr Katrinka BlazingSmith. Follow up in HF clinic as needed.   Audray Rumore NP-C  1:57 PM

## 2013-05-06 NOTE — Patient Instructions (Addendum)
Follow up as needed.   Take digoxin 0.125 mg daily.    Do the following things EVERYDAY: 1) Weigh yourself in the morning before breakfast. Write it down and keep it in a log. 2) Take your medicines as prescribed 3) Eat low salt foods-Limit salt (sodium) to 2000 mg per day.  4) Stay as active as you can everyday 5) Limit all fluids for the day to less than 2 liters

## 2013-05-09 NOTE — Discharge Summary (Signed)
INTERNAL MEDICINE ATTENDING DISCHARGE COSIGN   I evaluated the patient on the day of discharge and discussed the discharge plan with my resident team. I agree with the discharge documentation and disposition.   Briteny Fulghum 05/09/2013, 8:59 AM

## 2013-05-13 ENCOUNTER — Encounter: Payer: Medicare Other | Admitting: Physician Assistant

## 2013-05-15 ENCOUNTER — Ambulatory Visit (INDEPENDENT_AMBULATORY_CARE_PROVIDER_SITE_OTHER): Payer: Medicare Other | Admitting: Internal Medicine

## 2013-05-15 ENCOUNTER — Encounter: Payer: Self-pay | Admitting: *Deleted

## 2013-05-15 VITALS — BP 91/63 | HR 77 | Temp 97.2°F | Wt 149.5 lb

## 2013-05-15 DIAGNOSIS — I5022 Chronic systolic (congestive) heart failure: Secondary | ICD-10-CM

## 2013-05-15 DIAGNOSIS — I509 Heart failure, unspecified: Secondary | ICD-10-CM

## 2013-05-15 DIAGNOSIS — H269 Unspecified cataract: Secondary | ICD-10-CM | POA: Insufficient documentation

## 2013-05-15 DIAGNOSIS — I10 Essential (primary) hypertension: Secondary | ICD-10-CM

## 2013-05-15 LAB — BASIC METABOLIC PANEL WITH GFR
BUN: 21 mg/dL (ref 6–23)
CALCIUM: 9.5 mg/dL (ref 8.4–10.5)
CO2: 30 meq/L (ref 19–32)
CREATININE: 0.82 mg/dL (ref 0.50–1.10)
Chloride: 96 mEq/L (ref 96–112)
GFR, EST AFRICAN AMERICAN: 76 mL/min
GFR, Est Non African American: 66 mL/min
GLUCOSE: 76 mg/dL (ref 70–99)
Potassium: 4.5 mEq/L (ref 3.5–5.3)
Sodium: 135 mEq/L (ref 135–145)

## 2013-05-15 NOTE — Patient Instructions (Signed)
Please continue the great work!  Please continue to monitor your weight and take your medications as prescribed.  I am referring you to an eye doctor to assess your cataract for potential removal.

## 2013-05-15 NOTE — Progress Notes (Signed)
Patient ID: Jean Moore, female   DOB: 1926/10/24, 80 y.o.   MRN: [id number]    Subjective:   Patient ID: Jean Moore female   DOB: [birth year] 80 y.o.   MRN: [id number]  HPI: [name] is a 80 y.o. woman who presents for hospital f/u. She has done very well since being d/c from the hospital. She has kept a BP log and weight log. Furthermore, she tracks her sodium consumption. She has no complaints today.    Past Medical History  Diagnosis Date  . Atrial fibrillation     on coumadin since 2012, recently stopped because of vaginal bleeding  . Hypertension   . Hyperlipidemia   . Routine adult health maintenance 04/09/2012  . CHF (congestive heart failure)    Current Outpatient Prescriptions  Medication Sig Dispense Refill  . acetaminophen (TYLENOL) 500 MG tablet Take 500 mg by mouth every 6 (six) hours as needed for pain.      Marland Kitchen aspirin 81 MG tablet Take 1 tablet (81 mg total) by mouth daily.  120 tablet  5  . atorvastatin (LIPITOR) 40 MG tablet Take 1 tablet (40 mg total) by mouth daily at 6 PM.  90 tablet  4  . digoxin (LANOXIN) 0.125 MG tablet Take 1 tablet (0.125 mg total) by mouth daily.  90 tablet  4  . furosemide (LASIX) 40 MG tablet Take 1 tablet (40 mg total) by mouth 2 (two) times daily.  180 tablet  4  . lisinopril (PRINIVIL,ZESTRIL) 10 MG tablet Take 1 tablet (10 mg total) by mouth daily.  90 tablet  4  . metoprolol succinate (TOPROL-XL) 100 MG 24 hr tablet Take 1 tablet (100 mg total) by mouth daily. Take with or immediately following a meal.  90 tablet  4  . Multiple Vitamins-Minerals (CENTRUM SILVER ADULT 50+ PO) Take 1 tablet by mouth daily.      . Potassium Chloride ER 20 MEQ TBCR Take 20 mEq by mouth daily.  90 tablet  3   No current facility-administered medications for this visit.   No family history on file. History   Social History  . Marital Status: Widowed    Spouse Name: N/A    Number of Children: N/A  . Years of Education: N/A   Social History  Main Topics  . Smoking status: Never Smoker   . Smokeless tobacco: Not on file  . Alcohol Use: No  . Drug Use: No  . Sexual Activity: Not on file   Other Topics Concern  . Not on file   Social History Narrative  . No narrative on file   Review of Systems: Review of Systems  Constitutional: Negative for fever, chills and malaise/fatigue.  HENT: Negative for sore throat.   Respiratory: Negative for cough and shortness of breath.   Cardiovascular: Negative for chest pain, palpitations, orthopnea, leg swelling and PND.  Gastrointestinal: Negative for heartburn, nausea and vomiting.  Genitourinary: Negative for dysuria, urgency and frequency.  Neurological: Negative for weakness and headaches.    Objective:  Physical Exam: Filed Vitals:   05/15/13 1430  BP: 91/63  Pulse: 77  Temp: 97.2 F (36.2 C)  TempSrc: Oral  Weight: 149 lb 8 oz (67.813 kg)  SpO2: 98%   Physical Exam  Constitutional: She is oriented to person, place, and time. She appears well-developed and well-nourished. No distress.  HENT:  Head: Normocephalic.  Mouth/Throat: Oropharynx is clear and moist.  Neck: No JVD present.  Cardiovascular: Normal rate, regular  rhythm, normal heart sounds and intact distal pulses.  Exam reveals no friction rub.   No murmur heard. Pulmonary/Chest: Effort normal and breath sounds normal. No respiratory distress. She has no wheezes. She has no rales.  Abdominal: Soft. Bowel sounds are normal. She exhibits no distension. There is no tenderness.  Neurological: She is alert and oriented to person, place, and time.  Skin: She is not diaphoretic.  Psychiatric: She has a normal mood and affect. Her behavior is normal.    Assessment & Plan:

## 2013-05-16 ENCOUNTER — Encounter: Payer: Self-pay | Admitting: Internal Medicine

## 2013-05-16 NOTE — Assessment & Plan Note (Signed)
Patient complains of increasing dimness to her vision. She has bil cataract (likely nuclear) on physical exam. I recommended referral to ophthalmologist for evaluation and treatment of

## 2013-05-16 NOTE — Progress Notes (Signed)
I have discussed diagnoses, medications, and management with resident MB Pleas Koch and I agree with his assessment and plan. Cephas Darby, MD, FACP  Hematology-Oncology/Internal Medicine

## 2013-05-16 NOTE — Assessment & Plan Note (Addendum)
Patient has done well since d/c. No complaints today. Appears euvolemic. Plan to continue current management. Patient has f/u with heart failure clinic.  We discussed at length the routine of staying on top of her heart failure.

## 2013-05-27 ENCOUNTER — Other Ambulatory Visit (HOSPITAL_COMMUNITY): Payer: Self-pay | Admitting: Surgery

## 2013-05-27 MED ORDER — FUROSEMIDE 40 MG PO TABS: 40.0000 mg | ORAL_TABLET | Freq: Two times a day (BID) | ORAL | Status: DC

## 2013-05-27 MED ORDER — ATORVASTATIN CALCIUM 40 MG PO TABS: 40.0000 mg | ORAL_TABLET | Freq: Every day | ORAL | Status: DC

## 2013-05-27 MED ORDER — POTASSIUM CHLORIDE ER 20 MEQ PO TBCR: 20.0000 meq | EXTENDED_RELEASE_TABLET | Freq: Every day | ORAL | Status: DC

## 2013-05-27 MED ORDER — LISINOPRIL 10 MG PO TABS: 10.0000 mg | ORAL_TABLET | Freq: Every day | ORAL | Status: DC

## 2013-06-20 ENCOUNTER — Telehealth: Payer: Self-pay | Admitting: Interventional Cardiology

## 2013-06-20 ENCOUNTER — Telehealth (HOSPITAL_COMMUNITY): Payer: Self-pay | Admitting: *Deleted

## 2013-06-20 NOTE — Telephone Encounter (Signed)
Message left on answering machine to please contact for cardiac rehab.  Contact information left on message.

## 2013-06-20 NOTE — Telephone Encounter (Signed)
Left detailed message on personal voicemail Last echo 04-27-13 with EF% 10-15

## 2013-06-20 NOTE — Telephone Encounter (Signed)
New Message:  Jean Moore states she needs to know when the patients last echo was done and what the ejection fraction was.Marland KitchenMarland Kitchen

## 2013-06-28 ENCOUNTER — Encounter (INDEPENDENT_AMBULATORY_CARE_PROVIDER_SITE_OTHER): Payer: Self-pay

## 2013-06-28 ENCOUNTER — Encounter: Payer: Self-pay | Admitting: Interventional Cardiology

## 2013-06-28 ENCOUNTER — Ambulatory Visit (INDEPENDENT_AMBULATORY_CARE_PROVIDER_SITE_OTHER): Payer: Medicare Other | Admitting: Interventional Cardiology

## 2013-06-28 VITALS — BP 110/60 | HR 66 | Ht 63.0 in | Wt 150.0 lb

## 2013-06-28 DIAGNOSIS — R0609 Other forms of dyspnea: Secondary | ICD-10-CM

## 2013-06-28 DIAGNOSIS — R0989 Other specified symptoms and signs involving the circulatory and respiratory systems: Secondary | ICD-10-CM

## 2013-06-28 DIAGNOSIS — I10 Essential (primary) hypertension: Secondary | ICD-10-CM

## 2013-06-28 DIAGNOSIS — I4891 Unspecified atrial fibrillation: Secondary | ICD-10-CM

## 2013-06-28 DIAGNOSIS — R0600 Dyspnea, unspecified: Secondary | ICD-10-CM

## 2013-06-28 DIAGNOSIS — I509 Heart failure, unspecified: Secondary | ICD-10-CM

## 2013-06-28 DIAGNOSIS — I5022 Chronic systolic (congestive) heart failure: Secondary | ICD-10-CM

## 2013-06-28 DIAGNOSIS — Z7901 Long term (current) use of anticoagulants: Secondary | ICD-10-CM

## 2013-06-28 LAB — BASIC METABOLIC PANEL
BUN: 16 mg/dL (ref 6–23)
CO2: 31 mEq/L (ref 19–32)
CREATININE: 0.7 mg/dL (ref 0.4–1.2)
Calcium: 9.1 mg/dL (ref 8.4–10.5)
Chloride: 99 mEq/L (ref 96–112)
GFR: 97.43 mL/min (ref >60.00)
GLUCOSE: 104 mg/dL — AB (ref 70–99)
POTASSIUM: 3.8 meq/L (ref 3.5–5.1)
Sodium: 137 mEq/L (ref 135–145)

## 2013-06-28 LAB — BRAIN NATRIURETIC PEPTIDE: PRO B NATRI PEPTIDE: 502 pg/mL — AB (ref 0.0–100.0)

## 2013-06-28 NOTE — Patient Instructions (Addendum)
Your physician recommends that you continue on your current medications as directed. Please refer to the Current Medication list given to you today.  Lab Today: Bmet, Digoxin level  You have a follow up appointment scheduled for 10/02/13 @11 :30 am

## 2013-06-28 NOTE — Progress Notes (Signed)
Patient ID: Jean Moore, female   DOB: 10/06/1926, 80 y.o.   MRN: [id number]    [address] [place], [place]  [address] Phone: [phone] Fax:  [phone]  Date:  06/28/2013   ID:  Jean Moore, DOB 06/02/1926, MRN [id number]  PCP:  Debe CoderMULLEN, EMILY, MD   ASSESSMENT:  1. Chronic systolic heart failure, with acute component resolved 2. Dyspnea 3. Cramping 4. History of vaginal bleeding on Coumadin. Patient would not allow GYN workup 5. Chronic atrial fibrillation/flutter  PLAN:  1. Basic metabolic panel 2. Brain natruretic peptide 3. Three-month clinical followup 4. Continue daily weights and blood pressure recordings 5. Despite counseling otherwise, the patient is against invasive evaluation because she feels she is too old to undergo therapies that involved invasion of her body.   SUBJECTIVE: Jean Moore is a 80 y.o. female who was recently hospitalized with decompensated acute systolic heart failure on chronic. She has a history of dilated nonischemic cardiomyopathy. She says that I stopped her Lasix 2 years ago. She was admitted to the hospital she was on diuretic therapy. We have her on Coumadin because of chronic atrial fibrillation/flutter. She developed vaginal bleeding. The anticoagulant was stopped to allow GYN evaluation of the source of bleeding. She saw GYN but would not allow them to do a gynecologic examination. She is therefore not resumed anticoagulation therapy.  To the hospital stay the LVEF has deteriorated to 10-15%. Conversation concerning the possibility of AICD was disavowed by the patient who wanted to have no part of device implantation. She feels she is doing well. I have been concerned over a year about weight loss and poor appetite. She does not want to have this evaluated. She feels that she is too old to have a lot of things done.   Wt Readings from Last 3 Encounters:  06/28/13 150 lb (68.04 kg)  05/15/13 149 lb 8 oz (67.813 kg)    05/06/13 151 lb 2 oz (68.55 kg)     Past Medical History  Diagnosis Date  . Atrial fibrillation     on coumadin since 2012, recently stopped because of vaginal bleeding  . Hypertension   . Hyperlipidemia   . Routine adult health maintenance 04/09/2012  . CHF (congestive heart failure)     Current Outpatient Prescriptions  Medication Sig Dispense Refill  . acetaminophen (TYLENOL) 500 MG tablet Take 500 mg by mouth every 6 (six) hours as needed for pain.      Marland Kitchen. aspirin 81 MG tablet Take 1 tablet (81 mg total) by mouth daily.  120 tablet  5  . atorvastatin (LIPITOR) 40 MG tablet Take 1 tablet (40 mg total) by mouth daily at 6 PM.  90 tablet  4  . digoxin (LANOXIN) 0.125 MG tablet Take 1 tablet (0.125 mg total) by mouth daily.  90 tablet  4  . furosemide (LASIX) 40 MG tablet Take 1 tablet (40 mg total) by mouth 2 (two) times daily.  180 tablet  4  . lisinopril (PRINIVIL,ZESTRIL) 10 MG tablet Take 1 tablet (10 mg total) by mouth daily.  90 tablet  4  . metoprolol succinate (TOPROL-XL) 100 MG 24 hr tablet Take 1 tablet (100 mg total) by mouth daily. Take with or immediately following a meal.  90 tablet  4  . Multiple Vitamins-Minerals (CENTRUM SILVER ADULT 50+ PO) Take 1 tablet by mouth daily.      . Potassium Chloride ER 20 MEQ TBCR Take 20 mEq by  mouth daily.  90 tablet  3   No current facility-administered medications for this visit.    Allergies:    Allergies  Allergen Reactions  . Shrimp [Shellfish Allergy]     Social History:  The patient  reports that she has never smoked. She does not have any smokeless tobacco history on file. She reports that she does not drink alcohol or use illicit drugs.   ROS:  Please see the history of present illness.   Vaginal bleeding has stopped. No transient neurological complaints. She weighs and is monitoring sodium intake daily. Blood pressures at home of been running between 85 and 110 mm mercury. Appetite is still not normal.   All other systems  reviewed and negative.   OBJECTIVE: VS:  BP 110/60  Pulse 66  Ht 5\' 3"  (1.6 m)  Wt 150 lb (68.04 kg)  BMI 26.58 kg/m2  LMP 10/12/1978 Well nourished, well developed, in no acute distress, frail appearing HEENT: normal Neck: JVD moderate elevation sig at 90. Carotid bruit no bruits  Cardiac:  normal S1, S2; IIRR; no murmur Lungs:  clear to auscultation bilaterally, no wheezing, rhonchi or rales Abd: soft, nontender, no hepatomegaly Ext: Edema no edema. Pulses 2+ Skin: warm and dry Neuro:  CNs 2-12 intact, no focal abnormalities noted  EKG:  Not repeated       Signed, Darci Needle III, MD 06/28/2013 11:38 AM

## 2013-06-29 LAB — DIGOXIN LEVEL: Digoxin Level: 1.4 ng/mL (ref 0.8–2.0)

## 2013-07-02 ENCOUNTER — Telehealth: Payer: Self-pay

## 2013-07-02 NOTE — Telephone Encounter (Signed)
pt given lab results.Medical regimen is just right. No change is needed.pt verbalized understanding.

## 2013-07-02 NOTE — Telephone Encounter (Signed)
Message copied by Jarvis Newcomer on Tue Jul 02, 2013  2:23 PM ------      Message from: Verdis Prime      Created: Tue Jul 02, 2013  8:10 AM       Medical regimen is just right. No change is needed. ------

## 2013-10-02 ENCOUNTER — Ambulatory Visit (INDEPENDENT_AMBULATORY_CARE_PROVIDER_SITE_OTHER): Payer: Medicare Other | Admitting: Interventional Cardiology

## 2013-10-02 ENCOUNTER — Encounter: Payer: Self-pay | Admitting: Interventional Cardiology

## 2013-10-02 VITALS — BP 110/72 | HR 73 | Ht 64.0 in | Wt 147.8 lb

## 2013-10-02 DIAGNOSIS — I4891 Unspecified atrial fibrillation: Secondary | ICD-10-CM

## 2013-10-02 DIAGNOSIS — I509 Heart failure, unspecified: Secondary | ICD-10-CM

## 2013-10-02 DIAGNOSIS — Z7901 Long term (current) use of anticoagulants: Secondary | ICD-10-CM

## 2013-10-02 DIAGNOSIS — I10 Essential (primary) hypertension: Secondary | ICD-10-CM

## 2013-10-02 DIAGNOSIS — I5022 Chronic systolic (congestive) heart failure: Secondary | ICD-10-CM

## 2013-10-02 DIAGNOSIS — I482 Chronic atrial fibrillation, unspecified: Secondary | ICD-10-CM

## 2013-10-02 NOTE — Patient Instructions (Signed)
Your physician recommends that you continue on your current medications as directed. Please refer to the Current Medication list given to you today.  Your physician recommends that you return for lab work in: 6 months Bmet  Your physician wants you to follow-up in: 6 months with Dr.Smith You will receive a reminder letter in the mail two months in advance. If you don't receive a letter, please call our office to schedule the follow-up appointment.

## 2013-10-02 NOTE — Progress Notes (Signed)
Patient ID: Jean Moore, female   DOB: 07/24/1926, 80 y.o.   MRN: [id number]    [address] [place], [place]  [address] Phone: [phone] Fax:  [phone]  Date:  10/02/2013   ID:  Jean Moore, DOB Feb 10, 1926, MRN [id number]  PCP:  Debe Coder, MD   ASSESSMENT:  1. Chronic systolic heart failure 2. Chronic atrial fibrillation 3. Hypertension 4. Refuses anticoagulation because of prior vaginal bleeding  PLAN:  1. basic metabolic panel on return 2. Followup in 6 months   SUBJECTIVE: Jean Moore is a 80 y.o. female who denies dyspnea, orthopnea, and PND. There is no peripheral edema.   Wt Readings from Last 3 Encounters:  10/02/13 147 lb 12.8 oz (67.042 kg)  06/28/13 150 lb (68.04 kg)  05/15/13 149 lb 8 oz (67.813 kg)     Past Medical History  Diagnosis Date  . Atrial fibrillation     on coumadin since 2012, recently stopped because of vaginal bleeding  . Hypertension   . Hyperlipidemia   . Routine adult health maintenance 04/09/2012  . CHF (congestive heart failure)     Current Outpatient Prescriptions  Medication Sig Dispense Refill  . acetaminophen (TYLENOL) 500 MG tablet Take 500 mg by mouth every 6 (six) hours as needed for pain.      Marland Kitchen aspirin 81 MG tablet Take 1 tablet (81 mg total) by mouth daily.  120 tablet  5  . atorvastatin (LIPITOR) 40 MG tablet Take 1 tablet (40 mg total) by mouth daily at 6 PM.  90 tablet  4  . digoxin (LANOXIN) 0.125 MG tablet Take 1 tablet (0.125 mg total) by mouth daily.  90 tablet  4  . furosemide (LASIX) 40 MG tablet Take 1 tablet (40 mg total) by mouth 2 (two) times daily.  180 tablet  4  . lisinopril (PRINIVIL,ZESTRIL) 10 MG tablet Take 1 tablet (10 mg total) by mouth daily.  90 tablet  4  . metoprolol succinate (TOPROL-XL) 100 MG 24 hr tablet Take 1 tablet (100 mg total) by mouth daily. Take with or immediately following a meal.  90 tablet  4  . Multiple Vitamins-Minerals (CENTRUM SILVER ADULT 50+ PO)  Take 1 tablet by mouth daily.      . Potassium Chloride ER 20 MEQ TBCR Take 20 mEq by mouth daily.  90 tablet  3   No current facility-administered medications for this visit.    Allergies:    Allergies  Allergen Reactions  . Shrimp [Shellfish Allergy]     Social History:  The patient  reports that she has never smoked. She does not have any smokeless tobacco history on file. She reports that she does not drink alcohol or use illicit drugs.   ROS:  Please see the history of present illness.   Accompanied by her son. He had the case and her diet is been much better controlled with reference to volume and salt.   All other systems reviewed and negative.   OBJECTIVE: VS:  BP 110/72  Pulse 73  Ht  (1.626 m)  Wt 147 lb 12.8 oz (67.042 kg)  BMI 25.36 kg/m2  LMP 10/12/1978 Well nourished, well developed, in no acute distress, elderly but younger than stated age in appearance HEENT: normal Neck: JVD flat while sitting at 90. Carotid bruit are absent. 2+ and symmetric upstroke  Cardiac:  normal S1, S2; RRR; no murmur Lungs:  clear to auscultation bilaterally, no wheezing, rhonchi or  rales Abd: soft, nontender, no hepatomegaly Ext: Edema absent. Pulses 2+ Skin: warm and dry Neuro:  CNs 2-12 intact, no focal abnormalities noted  EKG:  Not performed       Signed, Darci Needle III, MD 10/02/2013 12:12 PM

## 2014-05-07 ENCOUNTER — Encounter: Payer: Self-pay | Admitting: Interventional Cardiology

## 2014-05-07 ENCOUNTER — Ambulatory Visit (INDEPENDENT_AMBULATORY_CARE_PROVIDER_SITE_OTHER): Payer: Medicare Other | Admitting: Interventional Cardiology

## 2014-05-07 VITALS — BP 128/84 | HR 81 | Ht 63.0 in | Wt 151.0 lb

## 2014-05-07 DIAGNOSIS — I5022 Chronic systolic (congestive) heart failure: Secondary | ICD-10-CM | POA: Diagnosis not present

## 2014-05-07 DIAGNOSIS — Z7901 Long term (current) use of anticoagulants: Secondary | ICD-10-CM

## 2014-05-07 DIAGNOSIS — I6521 Occlusion and stenosis of right carotid artery: Secondary | ICD-10-CM | POA: Insufficient documentation

## 2014-05-07 DIAGNOSIS — I480 Paroxysmal atrial fibrillation: Secondary | ICD-10-CM

## 2014-05-07 DIAGNOSIS — I10 Essential (primary) hypertension: Secondary | ICD-10-CM | POA: Diagnosis not present

## 2014-05-07 DIAGNOSIS — R0989 Other specified symptoms and signs involving the circulatory and respiratory systems: Secondary | ICD-10-CM

## 2014-05-07 NOTE — Patient Instructions (Signed)
Your physician wants you to follow-up in: 6 months with Dr. Marlou Starks will receive a reminder letter in the mail two months in advance. If you don't receive a letter, please call our office to schedule the follow-up appointment.   Your physician has requested that you have a carotid duplex. This test is an ultrasound of the carotid arteries in your neck. It looks at blood flow through these arteries that supply the brain with blood. Allow one hour for this exam. There are no restrictions or special instructions.  Your physician has recommended you make the following change in your medication:  1) Stop Digoxin

## 2014-05-07 NOTE — Progress Notes (Signed)
Cardiology Office Note   Date:  05/07/2014   ID:  [name], [name] 04-21-1926, MRN [id number]  PCP:  Evelena Peat, DO  Cardiologist:   Lesleigh Noe, MD   No chief complaint on file.     History of Present Illness: Jean Moore is a 81 y.o. female who presents for systolic heart failure and history of atrial fib/flutter. She feels well. She denies orthopnea. There is no peripheral edema. She feels dizzy from time to time and feels it is related to Lanoxin. She denies lower extremity swelling. Otherwise no complaints    Past Medical History  Diagnosis Date  . Atrial fibrillation     on coumadin since 2012, recently stopped because of vaginal bleeding  . Hypertension   . Hyperlipidemia   . Routine adult health maintenance 04/09/2012  . CHF (congestive heart failure)     Past Surgical History  Procedure Laterality Date  . No past surgeries       Current Outpatient Prescriptions  Medication Sig Dispense Refill  . acetaminophen (TYLENOL) 500 MG tablet Take 500 mg by mouth every 6 (six) hours as needed for pain.    Marland Kitchen aspirin 81 MG tablet Take 1 tablet (81 mg total) by mouth daily. 120 tablet 5  . atorvastatin (LIPITOR) 40 MG tablet Take 1 tablet (40 mg total) by mouth daily at 6 PM. 90 tablet 4  . furosemide (LASIX) 40 MG tablet Take 1 tablet (40 mg total) by mouth 2 (two) times daily. 180 tablet 4  . lisinopril (PRINIVIL,ZESTRIL) 10 MG tablet Take 1 tablet (10 mg total) by mouth daily. 90 tablet 4  . metoprolol succinate (TOPROL-XL) 100 MG 24 hr tablet Take 1 tablet (100 mg total) by mouth daily. Take with or immediately following a meal. 90 tablet 4  . Multiple Vitamins-Minerals (CENTRUM SILVER ADULT 50+ PO) Take 1 tablet by mouth daily.    . Potassium Chloride ER 20 MEQ TBCR Take 20 mEq by mouth daily. 90 tablet 3   No current facility-administered medications for this visit.    Allergies:   Shrimp    Social History:  The patient  reports that she has never  smoked. She does not have any smokeless tobacco history on file. She reports that she does not drink alcohol or use illicit drugs.   Family History:  The patient's family history includes Hypertension in her father and mother.    ROS:  Please see the history of present illness.   Otherwise, review of systems are positive for arthritis in both knees..   All other systems are reviewed and negative.    PHYSICAL EXAM: VS:  BP 128/84 mmHg  Pulse 81  Ht  (1.6 m)  Wt 151 lb (68.493 kg)  BMI 26.76 kg/m2  LMP 10/12/1978 , BMI Body mass index is 26.76 kg/(m^2). GEN: Well nourished, well developed, in no acute distress HEENT: normal Neck: no JVD, carotid bruits, or masses Cardiac: RRR; no murmurs, rubs. An S3 gallop is present. There is no edema  Respiratory:  clear to auscultation bilaterally, normal work of breathing GI: soft, nontender, nondistended, + BS MS: no deformity or atrophy Skin: warm and dry, no rash Neuro:  Strength and sensation are intact Psych: euthymic mood, full affect   EKG:  EKG is ordered today. The ekg ordered today demonstrates sinus rhythm at 81 bpm, first-degree AV block, left bundle-branch block.   Recent Labs: 06/28/2013: BUN 16; Creatinine 0.7; Potassium 3.8; Pro B Natriuretic  peptide (BNP) 502.0*; Sodium 137    Lipid Panel    Component Value Date/Time   CHOL 248* 01/22/2008 2004   TRIG 70 01/22/2008 2004   HDL 76 01/22/2008 2004   CHOLHDL 3.3 Ratio 01/22/2008 2004   VLDL 14 01/22/2008 2004   LDLCALC 158* 01/22/2008 2004      Wt Readings from Last 3 Encounters:  05/07/14 151 lb (68.493 kg)  10/02/13 147 lb 12.8 oz (67.042 kg)  06/28/13 150 lb (68.04 kg)      Other studies Reviewed: Additional studies/ records that were reviewed today include: .    ASSESSMENT AND PLAN:  Chronic systolic congestive heart failure: No evidence of volume overload  Paroxysmal atrial fibrillation: Currently in normal sinus rhythm  Essential hypertension:  Controlled  Right carotid bruit: In association with complaint of dizziness, rule out critical carotid stenosis.    Current medicines are reviewed at length with the patient today.  The patient has concerns regarding medicines.  The following changes have been made:  We discussed each of her medications and the clinical indication. She feels dizzy and thinks it is related to Lanoxin. We will stop this medication for one week to see if dizziness resolves. Concomitantly we will do a bilateral carotid Doppler to rule out high-grade carotid disease in the setting of dizziness.  Labs/ tests ordered today include:   Orders Placed This Encounter  Procedures  . US Carotid Duplex Bilateral  . EKG 12-Lead     Disposition:   FU with H.Smith in 8 month   Signed, Lesleigh Noe, MD  05/07/2014 3:20 PM    [facility] [address] [place], [place], [place]  [address] Phone: [phone]; Fax: [phone]

## 2014-05-09 ENCOUNTER — Encounter (HOSPITAL_COMMUNITY): Payer: Medicare Other

## 2014-05-14 ENCOUNTER — Ambulatory Visit (HOSPITAL_COMMUNITY): Payer: Medicare Other | Attending: Cardiology | Admitting: Cardiology

## 2014-05-14 DIAGNOSIS — I6523 Occlusion and stenosis of bilateral carotid arteries: Secondary | ICD-10-CM | POA: Diagnosis not present

## 2014-05-14 DIAGNOSIS — E785 Hyperlipidemia, unspecified: Secondary | ICD-10-CM | POA: Diagnosis not present

## 2014-05-14 DIAGNOSIS — R0989 Other specified symptoms and signs involving the circulatory and respiratory systems: Secondary | ICD-10-CM

## 2014-05-14 DIAGNOSIS — I10 Essential (primary) hypertension: Secondary | ICD-10-CM | POA: Diagnosis not present

## 2014-05-14 DIAGNOSIS — R42 Dizziness and giddiness: Secondary | ICD-10-CM

## 2014-05-14 DIAGNOSIS — I4891 Unspecified atrial fibrillation: Secondary | ICD-10-CM | POA: Insufficient documentation

## 2014-05-14 NOTE — Progress Notes (Signed)
Carotid duplex performed 

## 2014-05-16 ENCOUNTER — Telehealth: Payer: Self-pay

## 2014-05-16 DIAGNOSIS — E079 Disorder of thyroid, unspecified: Secondary | ICD-10-CM

## 2014-05-16 DIAGNOSIS — I6523 Occlusion and stenosis of bilateral carotid arteries: Secondary | ICD-10-CM

## 2014-05-16 NOTE — Telephone Encounter (Signed)
-----   Message from Lyn Records, MD sent at 05/15/2014  9:44 AM EDT ----- Right carotid has up to 79% blockage and needs to be followed up in 6 months. Also found abnormalities in thyroid and needs to be scheduled to have a thyroid u/s for thyroid mass

## 2014-05-16 NOTE — Telephone Encounter (Signed)
Pt aware of carotid results. Right carotid has up to 79% blockage and needs to be followed up in 6 months. Also found abnormalities in thyroid and needs to be scheduled to have a thyroid u/s for thyroid mass  Pt adv a scheduler from our office will call her to schedule the thyroid u/s @ [facility]  Pt verbalized understanding.

## 2014-05-16 NOTE — Telephone Encounter (Signed)
-----   Message from Henry W Smith, MD sent at 05/15/2014  9:44 AM EDT ----- Right carotid has up to 79% blockage and needs to be followed up in 6 months. Also found abnormalities in thyroid and needs to be scheduled to have a thyroid u/s for thyroid mass 

## 2014-05-23 ENCOUNTER — Ambulatory Visit (HOSPITAL_COMMUNITY): Payer: Medicare Other

## 2014-06-11 ENCOUNTER — Ambulatory Visit (INDEPENDENT_AMBULATORY_CARE_PROVIDER_SITE_OTHER): Payer: Medicare Other | Admitting: Internal Medicine

## 2014-06-11 ENCOUNTER — Telehealth: Payer: Self-pay | Admitting: Interventional Cardiology

## 2014-06-11 VITALS — BP 96/58 | HR 79 | Temp 98.3°F | Ht 64.0 in | Wt 154.2 lb

## 2014-06-11 DIAGNOSIS — R946 Abnormal results of thyroid function studies: Secondary | ICD-10-CM

## 2014-06-11 DIAGNOSIS — I5022 Chronic systolic (congestive) heart failure: Secondary | ICD-10-CM

## 2014-06-11 DIAGNOSIS — R9389 Abnormal findings on diagnostic imaging of other specified body structures: Secondary | ICD-10-CM

## 2014-06-11 DIAGNOSIS — Z79899 Other long term (current) drug therapy: Secondary | ICD-10-CM

## 2014-06-11 DIAGNOSIS — I10 Essential (primary) hypertension: Secondary | ICD-10-CM | POA: Diagnosis not present

## 2014-06-11 DIAGNOSIS — Z0000 Encounter for general adult medical examination without abnormal findings: Secondary | ICD-10-CM

## 2014-06-11 DIAGNOSIS — I4891 Unspecified atrial fibrillation: Secondary | ICD-10-CM | POA: Diagnosis not present

## 2014-06-11 DIAGNOSIS — I6521 Occlusion and stenosis of right carotid artery: Secondary | ICD-10-CM

## 2014-06-11 DIAGNOSIS — I480 Paroxysmal atrial fibrillation: Secondary | ICD-10-CM

## 2014-06-11 LAB — COMPLETE METABOLIC PANEL WITH GFR
ALT: 26 U/L (ref 0–35)
AST: 27 U/L (ref 0–37)
Albumin: 3.6 g/dL (ref 3.5–5.2)
Alkaline Phosphatase: 58 U/L (ref 39–117)
BUN: 21 mg/dL (ref 6–23)
CALCIUM: 8.9 mg/dL (ref 8.4–10.5)
CHLORIDE: 100 meq/L (ref 96–112)
CO2: 27 meq/L (ref 19–32)
Creat: 0.86 mg/dL (ref 0.50–1.10)
GFR, EST AFRICAN AMERICAN: 71 mL/min
GFR, Est Non African American: 61 mL/min
Glucose, Bld: 159 mg/dL — ABNORMAL HIGH (ref 70–99)
POTASSIUM: 4.4 meq/L (ref 3.5–5.3)
Sodium: 138 mEq/L (ref 135–145)
Total Bilirubin: 0.3 mg/dL (ref 0.2–1.2)
Total Protein: 6.9 g/dL (ref 6.0–8.3)

## 2014-06-11 NOTE — Assessment & Plan Note (Addendum)
BP Readings from Last 3 Encounters:  06/11/14 96/58  05/07/14 128/84  10/02/13 110/72    Lab Results  Component Value Date   NA 137 06/28/2013   K 3.8 06/28/2013   CREATININE 0.7 06/28/2013    Assessment: Blood pressure control:  controlled Progress toward BP goal:   at goal Comments:  BP a bit soft but asymptomatic.    Plan: Medications:  continue current medications:  Toprol XL 100mg  daily, lisinopril 10mg  daily, lasix 40mg  BID Other plans: Check CMP today.  RTC in 6 months for follow-up.

## 2014-06-11 NOTE — Assessment & Plan Note (Signed)
She declines vaccines or referral for DEXA scan.  She reports normal colonoscopy several years ago.  I do not think there would be benefit of sending for colonoscopy at this age and she is resistant to invasive screening and/or treatment.

## 2014-06-11 NOTE — Assessment & Plan Note (Addendum)
EF 10-15%.  She declined ICD placement due to concerns of invasive procedure at her age.  Appears euvolemic and is asymptomatic.   - continue Toprol, lisinopril, lasix - follow-up with Cardiology

## 2014-06-11 NOTE — Telephone Encounter (Signed)
New message      Pt c/o medication issue:  1. Name of Medication: digoxin 2. How are you currently taking this medication (dosage and times per day)? 125mg  3. Are you having a reaction (difficulty breathing--STAT)? no  4. What is your medication issue?  Stopped medication 3-30th.  Calling to give an update.  Pt feels some better but she is still a little dizzy.  Will Dr Katrinka Blazing want her to restart medication or stay off of it for a little while longer?

## 2014-06-11 NOTE — Progress Notes (Signed)
Subjective:    Patient ID: Jean Moore, female    DOB: 1926/03/31, 81 y.o.   MRN: [id number]  HPI Comments: Jean Moore is an 81 year old woman with PMH as below here for routine follow-up of chronic conditions.  Please see problem based charting for A&P.   Past Medical History  Diagnosis Date  . Atrial fibrillation     on coumadin since 2012, recently stopped because of vaginal bleeding  . Hypertension   . Hyperlipidemia   . Routine adult health maintenance 04/09/2012  . CHF (congestive heart failure)    Current Outpatient Prescriptions on File Prior to Visit  Medication Sig Dispense Refill  . acetaminophen (TYLENOL) 500 MG tablet Take 500 mg by mouth every 6 (six) hours as needed for pain.    Marland Kitchen aspirin 81 MG tablet Take 1 tablet (81 mg total) by mouth daily. 120 tablet 5  . atorvastatin (LIPITOR) 40 MG tablet Take 1 tablet (40 mg total) by mouth daily at 6 PM. 90 tablet 4  . furosemide (LASIX) 40 MG tablet Take 1 tablet (40 mg total) by mouth 2 (two) times daily. 180 tablet 4  . lisinopril (PRINIVIL,ZESTRIL) 10 MG tablet Take 1 tablet (10 mg total) by mouth daily. 90 tablet 4  . metoprolol succinate (TOPROL-XL) 100 MG 24 hr tablet Take 1 tablet (100 mg total) by mouth daily. Take with or immediately following a meal. 90 tablet 4  . Multiple Vitamins-Minerals (CENTRUM SILVER ADULT 50+ PO) Take 1 tablet by mouth daily.    . Potassium Chloride ER 20 MEQ TBCR Take 20 mEq by mouth daily. 90 tablet 3   No current facility-administered medications on file prior to visit.    Review of Systems  Constitutional: Negative for fever, chills, appetite change, fatigue and unexpected weight change.  HENT: Negative for hearing loss.   Eyes: Negative for visual disturbance.  Respiratory: Negative for cough and shortness of breath.   Cardiovascular: Negative for chest pain, palpitations and leg swelling.  Gastrointestinal: Negative for nausea, vomiting, abdominal pain, diarrhea, constipation and  blood in stool.  Genitourinary: Negative for dysuria, hematuria, vaginal bleeding and difficulty urinating.  Neurological: Negative for dizziness, syncope, weakness, light-headedness and headaches.  Psychiatric/Behavioral: Negative for dysphoric mood.       Filed Vitals:   06/11/14 1334  BP: 96/58  Pulse: 79  Temp: 98.3 F (36.8 C)  TempSrc: Oral  Height:  (1.626 m)  Weight: 154 lb 3.2 oz (69.945 kg)  SpO2: 98%   Objective:   Physical Exam  Constitutional: She is oriented to person, place, and time. She appears well-developed. No distress.  HENT:  Head: Normocephalic and atraumatic.  Mouth/Throat: Oropharynx is clear and moist. No oropharyngeal exudate.  Eyes: Conjunctivae and EOM are normal. Pupils are equal, round, and reactive to light. No scleral icterus.  Neck: Neck supple. No JVD present. No thyromegaly present.  Cardiovascular: Normal rate, regular rhythm and intact distal pulses.  Exam reveals no gallop and no friction rub.   Murmur heard. + R carotid bruit; 2+ radial and DP B/L  Pulmonary/Chest: Effort normal and breath sounds normal. No respiratory distress. She has no wheezes. She has no rales.  Abdominal: Soft. Bowel sounds are normal. She exhibits no distension and no mass. There is no tenderness. There is no rebound and no guarding.  Musculoskeletal: Normal range of motion. She exhibits no edema or tenderness.  Neurological: She is alert and oriented to person, place, and time. No cranial nerve  deficit.  Skin: Skin is warm. She is not diaphoretic.  Psychiatric: She has a normal mood and affect. Her behavior is normal.  Vitals reviewed.         Assessment & Plan:  Please see problem based assessment and plan.

## 2014-06-11 NOTE — Telephone Encounter (Signed)
Left pt a message to call back. 

## 2014-06-11 NOTE — Assessment & Plan Note (Addendum)
Right bruit was noted on exam by Cardiologist and she was sent for [place] which confirmed 60-79% RICA stenosis.   - continue ASA, statin and BP medications - follow-up with repeat US in 6 months; consider CEA

## 2014-06-11 NOTE — Patient Instructions (Signed)
1. I will call you if there are problems with your labwork.  Please be sure to go to your ultrasound appointment here in the hospital on 06/25/14.  Please let Dr. Michaelle Copas office know that you have stopped your digoxin.   2. Please take all medications as prescribed.    3. If you have worsening of your symptoms or new symptoms arise, please call the clinic ([phone]), or go to the ER immediately if symptoms are severe.   Please return to see me in 6 months or sooner if you have problems.

## 2014-06-11 NOTE — Progress Notes (Signed)
Medicine attending: Medical history, presenting problems, physical findings, and medications, reviewed with Dr Alex Wilson and I concur with  her evaluation and management plan. 

## 2014-06-11 NOTE — Assessment & Plan Note (Addendum)
Currently in NSR. She complained of dizziness that she feels was related to digoxin.  She informed her cardiologist, Dr. Katrinka Blazing at an office visit 4 weeks ago and this medication was stopped to see if her dizziness improved.  She continues to take Toprol XL.  She had been on coumadin but this medication was stopped after she developed vaginal bleeding (for which she declined further work-up).   - continue Toprol XL - follow-up with Dr. Katrinka Blazing regarding resuming dig

## 2014-06-11 NOTE — Assessment & Plan Note (Addendum)
Incidental avascular structures in thyroid B/L noted on carotid imaging.  Dedicated thyroid US has already been ordered by Cardiology office. - thyroid US 06/25/14 - check TSH today

## 2014-06-12 LAB — TSH: TSH: 0.566 u[IU]/mL (ref 0.350–4.500)

## 2014-06-19 ENCOUNTER — Other Ambulatory Visit (HOSPITAL_COMMUNITY): Payer: Self-pay | Admitting: Internal Medicine

## 2014-06-25 ENCOUNTER — Ambulatory Visit (HOSPITAL_COMMUNITY): Payer: Medicare Other

## 2014-07-02 ENCOUNTER — Ambulatory Visit (HOSPITAL_COMMUNITY): Payer: Medicare Other

## 2014-07-08 ENCOUNTER — Other Ambulatory Visit (HOSPITAL_COMMUNITY): Payer: Self-pay | Admitting: Internal Medicine

## 2014-07-23 ENCOUNTER — Ambulatory Visit (HOSPITAL_COMMUNITY)
Admission: RE | Admit: 2014-07-23 | Discharge: 2014-07-23 | Disposition: A | Discharge status: Home / Self Care | Payer: Medicare Other | Source: Ambulatory Visit | Attending: Interventional Cardiology | Admitting: Interventional Cardiology

## 2014-07-23 DIAGNOSIS — E079 Disorder of thyroid, unspecified: Secondary | ICD-10-CM

## 2014-07-23 DIAGNOSIS — E042 Nontoxic multinodular goiter: Secondary | ICD-10-CM | POA: Diagnosis not present

## 2014-07-24 ENCOUNTER — Telehealth: Payer: Self-pay

## 2014-07-24 DIAGNOSIS — E042 Nontoxic multinodular goiter: Secondary | ICD-10-CM

## 2014-07-24 NOTE — Telephone Encounter (Signed)
-----   Message from Lyn Records, MD sent at 07/23/2014  5:18 PM EDT ----- Bilateral complex and solid thyroid nodules. Please refer to endocrine and have results sent to that MD

## 2014-07-24 NOTE — Telephone Encounter (Signed)
Pt and pt daughter aware of thyroid ultrasound results.  Bilateral complex and solid thyroid nodules. We will  Refer her  to endocrine and have results sent to that MD  Adv pt and pt daughter a scheduler from our office will call them to scheduled. Pt and pt daughter had no further questions and verbalized understanding.

## 2014-07-29 ENCOUNTER — Other Ambulatory Visit (HOSPITAL_COMMUNITY): Payer: Self-pay | Admitting: Adult Health

## 2014-08-18 ENCOUNTER — Other Ambulatory Visit (HOSPITAL_COMMUNITY): Payer: Self-pay | Admitting: Internal Medicine

## 2014-08-20 ENCOUNTER — Encounter: Payer: Self-pay | Admitting: Endocrinology

## 2014-08-20 ENCOUNTER — Ambulatory Visit (INDEPENDENT_AMBULATORY_CARE_PROVIDER_SITE_OTHER): Payer: Medicare Other | Admitting: Endocrinology

## 2014-08-20 VITALS — BP 138/78 | HR 93 | Temp 98.3°F | Resp 16 | Ht 65.25 in | Wt 155.0 lb

## 2014-08-20 DIAGNOSIS — E042 Nontoxic multinodular goiter: Secondary | ICD-10-CM | POA: Diagnosis not present

## 2014-08-20 NOTE — Patient Instructions (Signed)
No further testing or treatment is needed for the thyroid lumps. However, most of the time, a "lumpy thyroid" will eventually become overactive.  this is usually a slow process, happening over the span of many years. Therefore, you should have the thyroid blood test about twice a year. If it becomes overactive, I would be happy to see you back here.

## 2014-08-20 NOTE — Progress Notes (Signed)
Subjective:    Patient ID: Jean Moore, female    DOB: Aug 02, 1926, 81 y.o.   MRN: [id number]  HPI dtr says in early 2016, pt was incidentally noted, on carotid US, to have a nodule at the thyroid.  She has no h/o thyroid problems.  she has no h/o XRT or surgery to the neck.   Past Medical History  Diagnosis Date  . Atrial fibrillation     on coumadin since 2012, recently stopped because of vaginal bleeding  . Hypertension   . Hyperlipidemia   . Routine adult health maintenance 04/09/2012  . CHF (congestive heart failure)     Past Surgical History  Procedure Laterality Date  . No past surgeries      History   Social History  . Marital Status: Widowed    Spouse Name: N/A  . Number of Children: N/A  . Years of Education: N/A   Occupational History  . Not on file.   Social History Main Topics  . Smoking status: Never Smoker   . Smokeless tobacco: Not on file  . Alcohol Use: No  . Drug Use: No  . Sexual Activity: Not on file   Other Topics Concern  . Not on file   Social History Narrative    Current Outpatient Prescriptions on File Prior to Visit  Medication Sig Dispense Refill  . acetaminophen (TYLENOL) 500 MG tablet Take 500 mg by mouth every 6 (six) hours as needed for pain.    Marland Kitchen aspirin 81 MG tablet Take 1 tablet (81 mg total) by mouth daily. 120 tablet 5  . atorvastatin (LIPITOR) 40 MG tablet TAKE 1 TABLET BY MOUTH DAILY AT 6 PM. 90 tablet PRN  . furosemide (LASIX) 40 MG tablet TAKE 1 TABLET BY MOUTH 2 TIMES DAILY. 180 tablet PRN  . lisinopril (PRINIVIL,ZESTRIL) 10 MG tablet TAKE 1 TABLET BY MOUTH DAILY. 90 tablet 4  . metoprolol succinate (TOPROL-XL) 100 MG 24 hr tablet TAKE 1 TABLET BY MOUTH DAILY. TAKE WITH OR IMMEDIATELY FOLLOWING A MEAL. 90 tablet PRN  . Multiple Vitamins-Minerals (CENTRUM SILVER ADULT 50+ PO) Take 1 tablet by mouth daily.    . potassium chloride SA (K-DUR,KLOR-CON) 20 MEQ tablet TAKE 1 TABLET BY MOUTH DAILY 90 tablet PRN   No current  facility-administered medications on file prior to visit.    Allergies  Allergen Reactions  . Shrimp [Shellfish Allergy]     Family History  Problem Relation Age of Onset  . Hypertension Mother   . Hypertension Father   . Thyroid disease Neg Hx     BP 138/78 mmHg  Pulse 93  Temp(Src) 98.3 F (36.8 C)  Resp 16  Ht 5' 5.25" (1.657 m)  Wt 155 lb (70.308 kg)  BMI 25.61 kg/m2  SpO2 97%  LMP 10/12/1978   Review of Systems Denies difficulty swallowing and sob.      Objective:   Physical Exam VITAL SIGNS:  See vs page GENERAL: no distress eyes: no periorbital swelling, no proptosis NECK: There is no palpable thyroid enlargement.  No thyroid nodule is palpable.  No palpable lymphadenopathy at the anterior neck. Skin: not diaphoretic Neuro: no tremor PSYCH: Alert and well-oriented.  Does not appear anxious nor depressed.   Lab Results  Component Value Date   TSH 0.566 06/11/2014   radiol (07/23/14): thyroid US: bilateral complex and solid nodules.    Assessment & Plan:  Small multinodular goiter, new to me.  She is at risk for hyperthyroidism.  Frail elderly state: in this setting, she would not benefit from bx and/or surgery  Patient is advised the following: Patient Instructions  No further testing or treatment is needed for the thyroid lumps. However, most of the time, a "lumpy thyroid" will eventually become overactive.  this is usually a slow process, happening over the span of many years. Therefore, you should have the thyroid blood test about twice a year. If it becomes overactive, I would be happy to see you back here.

## 2014-08-21 DIAGNOSIS — E042 Nontoxic multinodular goiter: Secondary | ICD-10-CM | POA: Insufficient documentation

## 2014-11-17 ENCOUNTER — Encounter (HOSPITAL_COMMUNITY): Payer: Medicare Other

## 2014-11-25 ENCOUNTER — Telehealth: Payer: Self-pay | Admitting: Interventional Cardiology

## 2014-11-25 NOTE — Telephone Encounter (Signed)
Returned pt daughter call. Adv her that in April 2016, pt carotid results showed 79% blockage in her right carotid. The test was to be repeated in 6 months. Pt daughter sts that pt wants to cancel her carotids scheduled on 10/19. She has had several ultrasounds of her neck in July 2016, and Dr.Ellis told her everything was ok, and that he discussed the results with Dr.Smith. Lamar Blinks that Dr.Smith is out of the office. Asked Louella to have pt recent testing fwd to our office for Dr.Smith to review. Pt has an upcoming appt with Dr.Smith in Nov 2016. FYI fwd to Dr.Smith

## 2014-11-25 NOTE — Telephone Encounter (Signed)
New Message  Pt dtr calling to speak w/ RN- pt has 6 month Carotid appt tomorrow 10/19. Pt does not want to do the test. Per pt dtr- Dr Everardo All has spoken w/ the pt and does not think Carotid is necessary. Please call back and discuss.

## 2014-11-26 ENCOUNTER — Encounter (HOSPITAL_COMMUNITY): Payer: Medicare Other

## 2014-12-17 ENCOUNTER — Ambulatory Visit (INDEPENDENT_AMBULATORY_CARE_PROVIDER_SITE_OTHER): Payer: Medicare Other | Admitting: Interventional Cardiology

## 2014-12-17 ENCOUNTER — Encounter: Payer: Self-pay | Admitting: Interventional Cardiology

## 2014-12-17 VITALS — BP 110/64 | HR 87 | Ht 63.0 in | Wt 163.8 lb

## 2014-12-17 DIAGNOSIS — I6523 Occlusion and stenosis of bilateral carotid arteries: Secondary | ICD-10-CM

## 2014-12-17 DIAGNOSIS — I5022 Chronic systolic (congestive) heart failure: Secondary | ICD-10-CM | POA: Diagnosis not present

## 2014-12-17 DIAGNOSIS — I10 Essential (primary) hypertension: Secondary | ICD-10-CM | POA: Diagnosis not present

## 2014-12-17 DIAGNOSIS — I482 Chronic atrial fibrillation, unspecified: Secondary | ICD-10-CM

## 2014-12-17 DIAGNOSIS — Z7901 Long term (current) use of anticoagulants: Secondary | ICD-10-CM

## 2014-12-17 NOTE — Patient Instructions (Signed)
Medication Instructions:  Your physician recommends that you continue on your current medications as directed. Please refer to the Current Medication list given to you today.   Labwork: None ordered   Testing/Procedures: Your physician has requested that you have a carotid duplex. This test is an ultrasound of the carotid arteries in your neck. It looks at blood flow through these arteries that supply the brain with blood. Allow one hour for this exam. There are no restrictions or special instructions. (Please call us when you are ready to schedule)  Follow-Up: Your physician wants you to follow-up in: 1 year with Dr.Smith You will receive a reminder letter in the mail two months in advance. If you don't receive a letter, please call our office to schedule the follow-up appointment.   Any Other Special Instructions Will Be Listed Below (If Applicable).     If you need a refill on your cardiac medications before your next appointment, please call your pharmacy.

## 2014-12-17 NOTE — Progress Notes (Signed)
Cardiology Office Note   Date:  12/17/2014   ID:  Jean Moore, DOB [birth year], MRN [id number]  PCP:  Evelena Peat, DO  Cardiologist:  Lesleigh Noe, MD   Chief Complaint  Patient presents with  . Carotid      History of Present Illness: Jean Moore is a 81 y.o. female who presents for  Cardiomyopathy, atrial arrhythmias, left bundle branch block, bilateral carotid disease, and hypertension.    She is asymptomatic and states she feels well. She refuses to have follow-up of her right carotid which has 60-79% stenosis. I had her come in today that we may personally discuss follow-up of the carotid stenosis.  She denies neurological symptoms.  I discussed the carotid findings. I discussed the need for close follow-up. We discussed the possible symptoms related to progressive carotid obstruction. She has no symptoms to suggest TIA.    Past Medical History  Diagnosis Date  . Atrial fibrillation (HCC)     on coumadin since 2012, recently stopped because of vaginal bleeding  . Hypertension   . Hyperlipidemia   . Routine adult health maintenance 04/09/2012  . CHF (congestive heart failure) [facility])     Past Surgical History  Procedure Laterality Date  . No past surgeries       Current Outpatient Prescriptions  Medication Sig Dispense Refill  . acetaminophen (TYLENOL) 500 MG tablet Take 500 mg by mouth every 6 (six) hours as needed for pain.    Marland Kitchen aspirin 81 MG tablet Take 1 tablet (81 mg total) by mouth daily. 120 tablet 5  . atorvastatin (LIPITOR) 40 MG tablet TAKE 1 TABLET BY MOUTH DAILY AT 6 PM. 90 tablet PRN  . furosemide (LASIX) 40 MG tablet TAKE 1 TABLET BY MOUTH 2 TIMES DAILY. 180 tablet PRN  . lisinopril (PRINIVIL,ZESTRIL) 10 MG tablet TAKE 1 TABLET BY MOUTH DAILY. 90 tablet 4  . metoprolol succinate (TOPROL-XL) 100 MG 24 hr tablet TAKE 1 TABLET BY MOUTH DAILY. TAKE WITH OR IMMEDIATELY FOLLOWING A MEAL. 90 tablet PRN  . Multiple Vitamins-Minerals (CENTRUM SILVER  ADULT 50+ PO) Take 1 tablet by mouth daily.    . potassium chloride SA (K-DUR,KLOR-CON) 20 MEQ tablet TAKE 1 TABLET BY MOUTH DAILY 90 tablet PRN   No current facility-administered medications for this visit.    Allergies:   Shrimp    Social History:  The patient  reports that she has never smoked. She has never used smokeless tobacco. She reports that she does not drink alcohol or use illicit drugs.   Family History:  The patient's family history includes Hypertension in her father and mother. There is no history of Thyroid disease.    ROS:  Please see the history of present illness.   Otherwise, review of systems are positive for  none.   All other systems are reviewed and negative.    PHYSICAL EXAM: VS:  BP 110/64 mmHg  Pulse 87  Ht  (1.6 m)  Wt 74.299 kg (163 lb 12.8 oz)  BMI 29.02 kg/m2  LMP 10/12/1978 , BMI Body mass index is 29.02 kg/(m^2). GEN: Well nourished, well developed, in no acute distress HEENT: normal Neck: no JVD, carotid bruits, or masses Cardiac: RRR.  There is no murmur, rub, or gallop. There is no edema. Respiratory:  clear to auscultation bilaterally, normal work of breathing. GI: soft, nontender, nondistended, + BS MS: no deformity or atrophy Skin: warm and dry, no rash Neuro:  Strength and sensation are intact  Psych: euthymic mood, full affect   EKG:  EKG  is ordered today. The ekg reveals  Sinus rhythm, biatrial abnormality, left bundle branch block, first-degree AV block , left axis deviation.   Recent Labs: 06/11/2014: ALT 26; BUN 21; Creat 0.86; Potassium 4.4; Sodium 138; TSH 0.566    Lipid Panel    Component Value Date/Time   CHOL 248* 01/22/2008 2004   TRIG 70 01/22/2008 2004   HDL 76 01/22/2008 2004   CHOLHDL 3.3 Ratio 01/22/2008 2004   VLDL 14 01/22/2008 2004   LDLCALC 158* 01/22/2008 2004      Wt Readings from Last 3 Encounters:  12/17/14 74.299 kg (163 lb 12.8 oz)  08/20/14 70.308 kg (155 lb)  06/11/14 69.945 kg (154 lb  3.2 oz)      Other studies Reviewed: Additional studies/ records that were reviewed today include:  Reviewed the Doppler study. The findings include  Discussed the findings with the patient.    ASSESSMENT AND PLAN:  1. Chronic atrial fibrillation (HCC)  Clinically in sinus rhythm based upon today's EKG - EKG 12-Lead  2. Chronic systolic congestive heart failure (HCC)  No evidence of volume overload. Clinically stable - EKG 12-Lead  3. Bilateral carotid artery stenosis  Right carotid with bruit an near 80% stenosis. She has refused follow-up.  4. Essential hypertension [place] - EKG 12-Lead  5. Chronic anticoagulation  no bleeding complications on current medical regimen    Current medicines are reviewed at length with the patient today.  The patient has the following concerns regarding medicines: none.  The following changes/actions have been instituted:     I have recommended that the patient have a every 6 month carotid Doppler. She has previously refused. She is still not certain if she will allow [place] to follow this serially. She will have one week to consider and give [place] a final decision. If she decides against follow-up, we will obey her request.  Labs/ tests ordered today include:   Orders Placed This Encounter  Procedures  . EKG 12-Lead     Disposition:   FU with HS in 1 months  Signed, Lesleigh Noe, MD  12/17/2014 5:53 PM    [facility] [address] [place], [place], [place]  [address] Phone: [phone]; Fax: [phone]

## 2015-01-21 ENCOUNTER — Encounter: Payer: Self-pay | Admitting: Internal Medicine

## 2015-01-21 ENCOUNTER — Ambulatory Visit (INDEPENDENT_AMBULATORY_CARE_PROVIDER_SITE_OTHER): Payer: Medicare Other | Admitting: Internal Medicine

## 2015-01-21 VITALS — BP 117/82 | HR 86 | Temp 97.9°F | Wt 164.1 lb

## 2015-01-21 DIAGNOSIS — I5022 Chronic systolic (congestive) heart failure: Secondary | ICD-10-CM

## 2015-01-21 DIAGNOSIS — R946 Abnormal results of thyroid function studies: Secondary | ICD-10-CM | POA: Diagnosis not present

## 2015-01-21 DIAGNOSIS — I480 Paroxysmal atrial fibrillation: Secondary | ICD-10-CM

## 2015-01-21 DIAGNOSIS — R9389 Abnormal findings on diagnostic imaging of other specified body structures: Secondary | ICD-10-CM

## 2015-01-21 DIAGNOSIS — R202 Paresthesia of skin: Secondary | ICD-10-CM | POA: Diagnosis not present

## 2015-01-21 DIAGNOSIS — Z7982 Long term (current) use of aspirin: Secondary | ICD-10-CM

## 2015-01-21 DIAGNOSIS — Z0000 Encounter for general adult medical examination without abnormal findings: Secondary | ICD-10-CM

## 2015-01-21 DIAGNOSIS — I10 Essential (primary) hypertension: Secondary | ICD-10-CM

## 2015-01-21 DIAGNOSIS — I110 Hypertensive heart disease with heart failure: Secondary | ICD-10-CM

## 2015-01-21 NOTE — Patient Instructions (Signed)
1. I will call you if there is a problem with your lab work.  If there is no reason for hand tingling I may refer your to neurology for more testing.   2. Please take all medications as prescribed.    3. If you have worsening of your symptoms or new symptoms arise, please call the clinic ([phone]), or go to the ER immediately if symptoms are severe.  Please return to see me in 6 months or sooner if you are having problems.

## 2015-01-21 NOTE — Progress Notes (Signed)
Subjective:    Patient ID: Jean Moore, female    DOB: 1926/02/24, 81 y.o.   MRN: [id number]  HPI Comments: Ms. Bills is an 81 year old woman with PMH as below here for follow-up of HTN.  Please see problem based charting for status of her chronic medical conditions.     Past Medical History  Diagnosis Date  . Atrial fibrillation (HCC)     on coumadin since 2012, recently stopped because of vaginal bleeding  . Hypertension   . Hyperlipidemia   . Routine adult health maintenance 04/09/2012  . CHF (congestive heart failure) (HCC)    Current Outpatient Prescriptions on File Prior to Visit  Medication Sig Dispense Refill  . acetaminophen (TYLENOL) 500 MG tablet Take 500 mg by mouth every 6 (six) hours as needed for pain.    Marland Kitchen aspirin 81 MG tablet Take 1 tablet (81 mg total) by mouth daily. 120 tablet 5  . atorvastatin (LIPITOR) 40 MG tablet TAKE 1 TABLET BY MOUTH DAILY AT 6 PM. 90 tablet PRN  . furosemide (LASIX) 40 MG tablet TAKE 1 TABLET BY MOUTH 2 TIMES DAILY. 180 tablet PRN  . lisinopril (PRINIVIL,ZESTRIL) 10 MG tablet TAKE 1 TABLET BY MOUTH DAILY. 90 tablet 4  . metoprolol succinate (TOPROL-XL) 100 MG 24 hr tablet TAKE 1 TABLET BY MOUTH DAILY. TAKE WITH OR IMMEDIATELY FOLLOWING A MEAL. 90 tablet PRN  . Multiple Vitamins-Minerals (CENTRUM SILVER ADULT 50+ PO) Take 1 tablet by mouth daily.    . potassium chloride SA (K-DUR,KLOR-CON) 20 MEQ tablet TAKE 1 TABLET BY MOUTH DAILY 90 tablet PRN   No current facility-administered medications on file prior to visit.    Review of Systems  Constitutional: Positive for appetite change. Negative for fever, chills, activity change and unexpected weight change.       Decreased appetite x 2 weeks.  Respiratory: Positive for shortness of breath.        Stable DOE.  Denies orthopnea, PND.  Cardiovascular: Negative for palpitations and leg swelling.  Gastrointestinal: Positive for constipation. Negative for nausea, vomiting, abdominal pain,  diarrhea and blood in stool.       Resolved with prunes.    Genitourinary: Negative for difficulty urinating.  Neurological: Positive for numbness. Negative for syncope and light-headedness.       Numbness/tingling B/L hands/fingers.  Feet ok.  Psychiatric/Behavioral: Positive for sleep disturbance. Negative for dysphoric mood. The patient is not nervous/anxious.        Insomnia once per week.  She reports it is not bothersome.    Takes care of own BADLs.  Cooks, grocery shops for self.  Son pays bills online.   Uses stationary bike at home for exercise twice per week.  Avoiding fried/fatty/fast foods.    Filed Vitals:   01/21/15 1354  BP: 117/82  Pulse: 86  Temp: 97.9 F (36.6 C)  TempSrc: Oral  Weight: 164 lb 1.6 oz (74.435 kg)  SpO2: 97%     Objective:   Physical Exam  Constitutional: She is oriented to person, place, and time. She appears well-developed. No distress.  HENT:  Head: Normocephalic and atraumatic.  Mouth/Throat: Oropharynx is clear and moist. No oropharyngeal exudate.  Eyes: Conjunctivae and EOM are normal.  Neck: Neck supple. Thyromegaly present.  Cardiovascular: Normal rate and regular rhythm.  Exam reveals no gallop and no friction rub.   Murmur heard. Pulmonary/Chest: Breath sounds normal. No respiratory distress. She has no wheezes. She has no rales.  Abdominal: Soft. Bowel  sounds are normal. She exhibits no distension and no mass. There is no tenderness. There is no rebound and no guarding.  Musculoskeletal: Normal range of motion. She exhibits no edema or tenderness.  Neurological: She is alert and oriented to person, place, and time. No cranial nerve deficit.  Strength and sensation intact and equal.  Skin: Skin is warm. She is not diaphoretic.  Psychiatric: She has a normal mood and affect. Her behavior is normal. Judgment and thought content normal.  Vitals reviewed.         Assessment & Plan:  Please see problem based charting for A&P.

## 2015-01-22 LAB — CMP14 + ANION GAP
A/G RATIO: 1.3 (ref 1.1–2.5)
ALK PHOS: 60 IU/L (ref 39–117)
ALT: 18 IU/L (ref 0–32)
AST: 20 IU/L (ref 0–40)
Albumin: 3.5 g/dL (ref 3.5–4.7)
Anion Gap: 13 mmol/L (ref 10.0–18.0)
BILIRUBIN TOTAL: 0.5 mg/dL (ref 0.0–1.2)
BUN/Creatinine Ratio: 18 (ref 11–26)
BUN: 13 mg/dL (ref 8–27)
CHLORIDE: 93 mmol/L — AB (ref 96–106)
CO2: 27 mmol/L (ref 18–29)
Calcium: 8.7 mg/dL (ref 8.7–10.3)
Creatinine, Ser: 0.74 mg/dL (ref 0.57–1.00)
GFR calc Af Amer: 85 mL/min/{1.73_m2} (ref >59)
GFR, EST NON AFRICAN AMERICAN: 74 mL/min/{1.73_m2} (ref >59)
Globulin, Total: 2.8 g/dL (ref 1.5–4.5)
Glucose: 138 mg/dL — ABNORMAL HIGH (ref 65–99)
Potassium: 4.4 mmol/L (ref 3.5–5.2)
Sodium: 133 mmol/L — ABNORMAL LOW (ref 134–144)
TOTAL PROTEIN: 6.3 g/dL (ref 6.0–8.5)

## 2015-01-22 LAB — VITAMIN B12: Vitamin B-12: >2000 pg/mL — ABNORMAL HIGH (ref 211–946)

## 2015-01-22 LAB — TSH: TSH: 1.02 u[IU]/mL (ref 0.450–4.500)

## 2015-01-23 DIAGNOSIS — R202 Paresthesia of skin: Secondary | ICD-10-CM | POA: Insufficient documentation

## 2015-01-23 NOTE — Assessment & Plan Note (Addendum)
Assessment:  Euvolemic on exam.  Wt only up 1 pound since she saw cardiologist last month (prior to Thanksgiving).   Asymptomatic. Plan:  Continue current med regimen - lisinopril 10mg  daily, Toprol XL 100mg  daily and Lasix 40mg  BID.  Follow-up with Dr. Katrinka Blazing (cardiology) as scheduled.  Return to see me in 6 months.

## 2015-01-23 NOTE — Assessment & Plan Note (Addendum)
Assessment:  She reports numbness/tingling of B/L hands (ventral aspect and includes all fingers).  Occuring intermittently, not related to activities or time of day or night.  No other symptoms.  Feet not involved.  She has no DM hx but recent BMPs reveal glucose 150s (unsure if fasting).  Sensation not isolated to median nerve distribution or occuring at night so I am less suspicious for carpal tunnel. Plan:  Check CMP, B12, TSH.  Consider neuro referral if labs unrevealing.  However, she tells me she would not be interested in seeing another specialist.

## 2015-01-23 NOTE — Assessment & Plan Note (Addendum)
Assessment:  She continues to be in NSR when evaluated in the office.  EKG done at cardiology last month shows NSR.  She denies symptoms of Afib.  She had been on coumadin in the past but stopped after experiencing serious vaginal bleeding.  If she is maintaining NSR (which it appear she is) I suspect she is low risk for stroke.  Plan:  Continue ASA.  I will touch base with get her cardiologist opinion about the A/C (I suspect she may not be interested in A/C).  Follow-up with cardiology as scheduled and return to see me in 6 months.

## 2015-01-23 NOTE — Assessment & Plan Note (Signed)
Assessment:  MNG.  Per endocrine, no further w/u due to advanced age. Plan:  Continue to monitor for new symptoms.  TSH today (she is having numbness/tingling).

## 2015-01-23 NOTE — Assessment & Plan Note (Addendum)
BP Readings from Last 3 Encounters:  01/21/15 117/82  12/17/14 110/64  08/20/14 138/78    Lab Results  Component Value Date   NA 133* 01/21/2015   K 4.4 01/21/2015   CREATININE 0.74 01/21/2015    Assessment: Blood pressure control:  well controlled Progress toward BP goal:   at goal Comments:  Complaint with lisinopril 10mg  daily, Toprol XL 100mg  daily and Lasix 40mg  BID and she has brought all of her meds to the appt.  Plan: Medications:  continue current medications:  lisinopril 10mg  daily, Toprol XL 100mg  daily and Lasix 40mg  BID Educational resources provided: brochure, handout, video Self management tools provided:   Other plans: CMP today.  RTC in 6 months for follow-up.

## 2015-01-23 NOTE — Assessment & Plan Note (Signed)
She again declines vaccines.  Will continue to offer.

## 2015-01-27 NOTE — Progress Notes (Signed)
Case discussed with Dr. Wilson soon after the resident saw the patient.  We reviewed the resident's history and exam and pertinent patient test results.  I agree with the assessment, diagnosis and plan of care documented in the resident's note. 

## 2015-02-05 ENCOUNTER — Telehealth: Payer: Self-pay | Admitting: *Deleted

## 2015-02-05 NOTE — Telephone Encounter (Signed)
Call to patient given message that labs were normal per order of Dr. Andrey Campanile.  Patient was also asked if she had eaten before the labs were done.  Patient said that she had eaten breakfast.   Dr. Andrey Campanile was informed of.  Labs were still determined to be normal.  Patient voiced understanding of the information given.  Angelina Ok, RN 02/05/2015 4:34 PM

## 2015-03-10 MED FILL — COMBIGAN EYE DROPS: 0.2-0.5 | 25 days supply | Qty: 5 | Fill #0

## 2015-03-26 MED FILL — LATANOPROST 0.005% EYE DRP: 0.005 | 30 days supply | Qty: 3 | Fill #0

## 2015-04-10 ENCOUNTER — Ambulatory Visit (INDEPENDENT_AMBULATORY_CARE_PROVIDER_SITE_OTHER): Payer: Medicare Other | Admitting: Internal Medicine

## 2015-04-10 ENCOUNTER — Encounter: Payer: Self-pay | Admitting: Internal Medicine

## 2015-04-10 ENCOUNTER — Ambulatory Visit (HOSPITAL_COMMUNITY): Payer: Medicare Other | Attending: Internal Medicine

## 2015-04-10 VITALS — BP 108/72 | HR 81 | Temp 97.7°F | Ht 65.0 in | Wt 165.4 lb

## 2015-04-10 DIAGNOSIS — R000 Tachycardia, unspecified: Secondary | ICD-10-CM | POA: Insufficient documentation

## 2015-04-10 DIAGNOSIS — I509 Heart failure, unspecified: Secondary | ICD-10-CM | POA: Diagnosis not present

## 2015-04-10 DIAGNOSIS — I5022 Chronic systolic (congestive) heart failure: Secondary | ICD-10-CM | POA: Diagnosis not present

## 2015-04-10 DIAGNOSIS — R0609 Other forms of dyspnea: Secondary | ICD-10-CM | POA: Diagnosis not present

## 2015-04-10 DIAGNOSIS — I440 Atrioventricular block, first degree: Secondary | ICD-10-CM | POA: Insufficient documentation

## 2015-04-10 DIAGNOSIS — R9431 Abnormal electrocardiogram [ECG] [EKG]: Secondary | ICD-10-CM | POA: Diagnosis not present

## 2015-04-10 DIAGNOSIS — I493 Ventricular premature depolarization: Secondary | ICD-10-CM | POA: Diagnosis not present

## 2015-04-10 DIAGNOSIS — R5383 Other fatigue: Secondary | ICD-10-CM | POA: Diagnosis not present

## 2015-04-10 MED ORDER — METOPROLOL SUCCINATE ER 50 MG PO TB24: 50.0000 mg | ORAL_TABLET | Freq: Every day | ORAL | Status: AC

## 2015-04-10 NOTE — Assessment & Plan Note (Addendum)
History:  She presents with subacute worsening of fatigue and dyspnea on exertion over the last 2 weeks.  Weight is stable and she denies any orthopnea.  Assessment:  I think her fatigue and dyspnea on exertion are likely from slow progression of her heart failure with reduced ejection fraction of 10 to 15%. She was slightly hypotensive today at 100/70 with pulse 80 which she says is her norm at home. Her pulse felt irregular but an EKG showed sinus rhythm with first degree AV block and unchanged left anterior fascicular block. Moreover, I don't think I can make a vast improvement in her energy level but I'll try to tweak some of her heart failure medications to ensure she's perfusing her brain as much as possible.  Plan: I've recommended she drop her metoprolol from 100 to 50 mg daily and start taking her lisinopril in the evening. She appears euvolemic, so I recommended she continue the furosemide 40 mg twice daily. I'll see her back in 2 weeks to follow-up her fatigue.

## 2015-04-10 NOTE — Patient Instructions (Signed)
Jean Moore,  It was very nice meeting you today.  As we discussed, I think that your fatigue and shortness of breath when you walk is likely from your heart failure. We will try to tweak your medications, with hopes that we can help your symptoms. Please start taking Toprol XL 50mg  daily instead of 100mg  daily.  I'll see you back in 2 weeks.  Till then, take care, and call us if something comes up, Dr. Earnest Conroy

## 2015-04-10 NOTE — Progress Notes (Signed)
Medicine attending: Medical history, presenting problems, physical findings, and medications, reviewed with resident physician Dr Selina Cooley on the day of the patient visit and I concur with his evaluation and management plan.

## 2015-04-10 NOTE — Progress Notes (Signed)
Patient ID: Jean Moore, female   DOB: August 21, 1926, 82 y.o.   MRN: [id number] [facility] Subjective:   Patient ID: Jean Moore female   DOB: 1926-10-07 82 y.o.   MRN: [id number]  HPI: Jean Moore is a 82 y.o. female with heart failure with reduced ejection fraction of 10-15%, paroxysmal atrial fibrillation not on anticoagulation, hypertension, and hyperlipidemia, presenting to clinic for fatigue and dyspnea on exertion for the last 2 weeks.  Fatigue and dyspnea on exertion:  While doing her house chores, she has felt more tired as she exerts herself. Her weight has been completely normal, she denies orthopnea or worsening leg swelling. She for the desires any palpitations or exertional chest pain. She's been taking her furosemide 40 mg twice daily metoprolol succinate 100 mg daily.  I've reviewed her medications with her and she is not a smoker.  Please see the assessment and plan for the status of the patient's chronic medical problems.  Review of Systems  Constitutional: Positive for malaise/fatigue. Negative for fever, chills and weight loss.  Respiratory: Positive for shortness of breath. Negative for cough, sputum production and wheezing.   Cardiovascular: Positive for leg swelling. Negative for chest pain, palpitations, orthopnea and PND.  Gastrointestinal: Negative for abdominal pain.  Genitourinary: Negative for dysuria, urgency and frequency.  Neurological: Negative for dizziness and loss of consciousness.  Psychiatric/Behavioral: Negative for depression.   Objective:  Physical Exam: Filed Vitals:   04/10/15 1402  BP: 108/72  Pulse: 81  Temp: 97.7 F (36.5 C)  TempSrc: Oral  Height: 5\' 5"  (1.651 m)  Weight: 165 lb 6.4 oz (75.025 kg)  SpO2: 98%   General: very friendly elderly black lady resting in chair comfortably, appropriately conversational Cardiac: regular rate and rhythm, 2/6 systolic ejection murmur Pulm: breathing well, clear to  auscultation bilaterally Ext: warm and well perfused, with stable 2+ pitting edema to shins  Assessment & Plan:  Case discussed with Dr. Cyndie Chime  Chronic systolic congestive heart failure History:  She presents with subacute worsening of fatigue and dyspnea on exertion over the last 2 weeks.  Weight is stable and she denies any orthopnea.  Assessment:  I think her fatigue and dyspnea on exertion are likely from slow progression of her heart failure with reduced ejection fraction of 10 to 15%. She was slightly hypotensive today at 100/70 with pulse 80 which she says is her norm at home. Her pulse felt irregular but an EKG showed sinus rhythm with first degree AV block and unchanged left anterior fascicular block. Moreover, I don't think I can make a vast improvement in her energy level but I'll try to tweak some of her heart failure medications to ensure she's perfusing her brain as much as possible.  Plan: I've recommended she drop her metoprolol from 100 to 50 mg daily and start taking her lisinopril in the evening. She appears euvolemic, so I recommended she continue the furosemide 40 mg twice daily. I'll see her back in 2 weeks to follow-up her fatigue.    Medications Ordered Meds ordered this encounter  Medications  . metoprolol succinate (TOPROL-XL) 50 MG 24 hr tablet    Sig: Take 1 tablet (50 mg total) by mouth daily. Take with or immediately following a meal.    Dispense:  90 tablet    Refill:  0   Other Orders Orders Placed This Encounter  Procedures  . EKG 12-Lead   Follow Up: Return in about 2 weeks (around 04/24/2015).

## 2015-04-13 MED FILL — COMBIGAN EYE DROPS: 0.2-0.5 | 25 days supply | Qty: 5 | Fill #1

## 2015-04-14 ENCOUNTER — Emergency Department (HOSPITAL_COMMUNITY): Payer: Medicare Other

## 2015-04-14 ENCOUNTER — Inpatient Hospital Stay (HOSPITAL_COMMUNITY)
Admission: EM | Admit: 2015-04-14 | Discharge: 2015-04-17 | DRG: 292 | Disposition: A | Discharge status: Home Health | Payer: Medicare Other | Attending: Infectious Disease | Admitting: Infectious Disease

## 2015-04-14 ENCOUNTER — Telehealth: Payer: Self-pay | Admitting: Interventional Cardiology

## 2015-04-14 ENCOUNTER — Encounter (HOSPITAL_COMMUNITY): Payer: Self-pay | Admitting: Cardiology

## 2015-04-14 DIAGNOSIS — I110 Hypertensive heart disease with heart failure: Principal | ICD-10-CM | POA: Diagnosis present

## 2015-04-14 DIAGNOSIS — I5023 Acute on chronic systolic (congestive) heart failure: Secondary | ICD-10-CM | POA: Diagnosis present

## 2015-04-14 DIAGNOSIS — I480 Paroxysmal atrial fibrillation: Secondary | ICD-10-CM | POA: Diagnosis present

## 2015-04-14 DIAGNOSIS — F05 Delirium due to known physiological condition: Secondary | ICD-10-CM | POA: Diagnosis present

## 2015-04-14 DIAGNOSIS — Z7982 Long term (current) use of aspirin: Secondary | ICD-10-CM

## 2015-04-14 DIAGNOSIS — E785 Hyperlipidemia, unspecified: Secondary | ICD-10-CM | POA: Diagnosis present

## 2015-04-14 DIAGNOSIS — Z9119 Patient's noncompliance with other medical treatment and regimen: Secondary | ICD-10-CM

## 2015-04-14 DIAGNOSIS — R091 Pleurisy: Secondary | ICD-10-CM | POA: Insufficient documentation

## 2015-04-14 DIAGNOSIS — R600 Localized edema: Secondary | ICD-10-CM | POA: Diagnosis not present

## 2015-04-14 DIAGNOSIS — I509 Heart failure, unspecified: Secondary | ICD-10-CM

## 2015-04-14 DIAGNOSIS — G4700 Insomnia, unspecified: Secondary | ICD-10-CM | POA: Diagnosis not present

## 2015-04-14 DIAGNOSIS — R05 Cough: Secondary | ICD-10-CM | POA: Diagnosis present

## 2015-04-14 DIAGNOSIS — I10 Essential (primary) hypertension: Secondary | ICD-10-CM | POA: Diagnosis present

## 2015-04-14 LAB — CBC WITH DIFFERENTIAL/PLATELET
BASOS PCT: 0 %
Basophils Absolute: 0 10*3/uL (ref 0.0–0.1)
Eosinophils Absolute: 0 10*3/uL (ref 0.0–0.7)
Eosinophils Relative: 1 %
HCT: 40.4 % (ref 36.0–46.0)
HEMOGLOBIN: 12.7 g/dL (ref 12.0–15.0)
LYMPHS ABS: 1.5 10*3/uL (ref 0.7–4.0)
LYMPHS PCT: 25 %
MCH: 27.7 pg (ref 26.0–34.0)
MCHC: 31.4 g/dL (ref 30.0–36.0)
MCV: 88 fL (ref 78.0–100.0)
Monocytes Absolute: 0.6 10*3/uL (ref 0.1–1.0)
Monocytes Relative: 10 %
NEUTROS ABS: 3.7 10*3/uL (ref 1.7–7.7)
Neutrophils Relative %: 64 %
Platelets: 139 10*3/uL — ABNORMAL LOW (ref 150–400)
RBC: 4.59 MIL/uL (ref 3.87–5.11)
RDW: 14.7 % (ref 11.5–15.5)
WBC: 5.8 10*3/uL (ref 4.0–10.5)

## 2015-04-14 LAB — URINALYSIS, ROUTINE W REFLEX MICROSCOPIC
GLUCOSE, UA: NEGATIVE mg/dL
HGB URINE DIPSTICK: NEGATIVE
Ketones, ur: NEGATIVE mg/dL
Nitrite: NEGATIVE
PH: 7.5 (ref 5.0–8.0)
Protein, ur: 100 mg/dL — AB
SPECIFIC GRAVITY, URINE: 1.026 (ref 1.005–1.030)

## 2015-04-14 LAB — COMPREHENSIVE METABOLIC PANEL
ALT: 20 U/L (ref 14–54)
AST: 24 U/L (ref 15–41)
Albumin: 2.3 g/dL — ABNORMAL LOW (ref 3.5–5.0)
Alkaline Phosphatase: 46 U/L (ref 38–126)
Anion gap: 8 (ref 5–15)
BUN: 20 mg/dL (ref 6–20)
CHLORIDE: 97 mmol/L — AB (ref 101–111)
CO2: 33 mmol/L — AB (ref 22–32)
CREATININE: 0.98 mg/dL (ref 0.44–1.00)
Calcium: 8.7 mg/dL — ABNORMAL LOW (ref 8.9–10.3)
GFR, EST AFRICAN AMERICAN: 59 mL/min — AB (ref >60)
GFR, EST NON AFRICAN AMERICAN: 51 mL/min — AB (ref >60)
Glucose, Bld: 163 mg/dL — ABNORMAL HIGH (ref 65–99)
POTASSIUM: 4.1 mmol/L (ref 3.5–5.1)
SODIUM: 138 mmol/L (ref 135–145)
Total Bilirubin: 0.8 mg/dL (ref 0.3–1.2)
Total Protein: 6.1 g/dL — ABNORMAL LOW (ref 6.5–8.1)

## 2015-04-14 LAB — URINE MICROSCOPIC-ADD ON

## 2015-04-14 LAB — PROTIME-INR
INR: 1.25 (ref 0.00–1.49)
Prothrombin Time: 15.8 seconds — ABNORMAL HIGH (ref 11.6–15.2)

## 2015-04-14 LAB — INFLUENZA PANEL BY PCR (TYPE A & B)
H1N1FLUPCR: NOT DETECTED
INFLAPCR: NEGATIVE
Influenza B By PCR: NEGATIVE

## 2015-04-14 LAB — I-STAT CG4 LACTIC ACID, ED
LACTIC ACID, VENOUS: 0.96 mmol/L (ref 0.5–2.0)
LACTIC ACID, VENOUS: 1.19 mmol/L (ref 0.5–2.0)

## 2015-04-14 LAB — TROPONIN I: Troponin I: 0.07 ng/mL — ABNORMAL HIGH (ref <0.031)

## 2015-04-14 LAB — BRAIN NATRIURETIC PEPTIDE: B NATRIURETIC PEPTIDE 5: 2772 pg/mL — AB (ref 0.0–100.0)

## 2015-04-14 MED ORDER — ADULT MULTIVITAMIN W/MINERALS CH
1.0000 | ORAL_TABLET | Freq: Every day | ORAL | Status: DC
  Administered 2015-04-15 – 2015-04-17 (×3): 1 via ORAL
  Filled 2015-04-14 (×3): qty 1

## 2015-04-14 MED ORDER — SODIUM CHLORIDE 0.9% FLUSH: 3.0000 mL | INTRAVENOUS | Status: DC | PRN

## 2015-04-14 MED ORDER — ATORVASTATIN CALCIUM 40 MG PO TABS
40.0000 mg | ORAL_TABLET | Freq: Every day | ORAL | Status: DC
  Administered 2015-04-14 – 2015-04-16 (×3): 40 mg via ORAL
  Filled 2015-04-14 (×3): qty 1

## 2015-04-14 MED ORDER — FUROSEMIDE 10 MG/ML IJ SOLN
40.0000 mg | Freq: Once | INTRAMUSCULAR | Status: AC
  Administered 2015-04-14: 40 mg via INTRAVENOUS
  Filled 2015-04-14: qty 4

## 2015-04-14 MED ORDER — DICLOFENAC SODIUM 1 % TD GEL
2.0000 g | Freq: Four times a day (QID) | TRANSDERMAL | Status: DC
  Administered 2015-04-14 – 2015-04-17 (×10): 2 g via TOPICAL
  Filled 2015-04-14: qty 100

## 2015-04-14 MED ORDER — SODIUM CHLORIDE 0.9% FLUSH
3.0000 mL | Freq: Two times a day (BID) | INTRAVENOUS | Status: DC
  Administered 2015-04-14 – 2015-04-17 (×6): 3 mL via INTRAVENOUS

## 2015-04-14 MED ORDER — LISINOPRIL 10 MG PO TABS
10.0000 mg | ORAL_TABLET | Freq: Every day | ORAL | Status: DC
  Administered 2015-04-15 – 2015-04-17 (×3): 10 mg via ORAL
  Filled 2015-04-14 (×3): qty 1

## 2015-04-14 MED ORDER — ASPIRIN 81 MG PO CHEW
81.0000 mg | CHEWABLE_TABLET | Freq: Every day | ORAL | Status: DC
  Administered 2015-04-15 – 2015-04-17 (×3): 81 mg via ORAL
  Filled 2015-04-14 (×3): qty 1

## 2015-04-14 MED ORDER — ENOXAPARIN SODIUM 40 MG/0.4ML ~~LOC~~ SOLN
40.0000 mg | SUBCUTANEOUS | Status: DC
  Administered 2015-04-14 – 2015-04-16 (×3): 40 mg via SUBCUTANEOUS
  Filled 2015-04-14 (×3): qty 0.4

## 2015-04-14 MED ORDER — SODIUM CHLORIDE 0.9 % IV SOLN: 250.0000 mL | INTRAVENOUS | Status: DC | PRN

## 2015-04-14 MED ORDER — ACETAMINOPHEN 325 MG PO TABS: 650.0000 mg | ORAL_TABLET | ORAL | Status: DC | PRN

## 2015-04-14 MED ORDER — METOPROLOL SUCCINATE ER 50 MG PO TB24
50.0000 mg | ORAL_TABLET | Freq: Every day | ORAL | Status: DC
  Administered 2015-04-15 – 2015-04-17 (×3): 50 mg via ORAL
  Filled 2015-04-14 (×3): qty 1

## 2015-04-14 MED ORDER — NON FORMULARY: 0.1000 mg | Freq: Once | Status: DC

## 2015-04-14 MED ORDER — FUROSEMIDE 10 MG/ML IJ SOLN
40.0000 mg | Freq: Two times a day (BID) | INTRAMUSCULAR | Status: DC
  Administered 2015-04-15 – 2015-04-17 (×5): 40 mg via INTRAVENOUS
  Filled 2015-04-14 (×6): qty 4

## 2015-04-14 MED ORDER — DM-GUAIFENESIN ER 30-600 MG PO TB12
1.0000 | ORAL_TABLET | Freq: Two times a day (BID) | ORAL | Status: DC
  Administered 2015-04-14 – 2015-04-17 (×6): 1 via ORAL
  Filled 2015-04-14 (×6): qty 1

## 2015-04-14 NOTE — Telephone Encounter (Signed)
Returned pt daughter Baruch Goldmann She sts that she has taken the pt to [facility] ED for evaluation. Pt has had cxr, labs, and they are waiting to see the doctor. Louella wants make sure that Dr.Smith is aware. Adv her that Dr.Smith is not in the office, he is working at [facility] cath lab. I will fwd him the update. She voiced appreciation and verbalized understanding.

## 2015-04-14 NOTE — Telephone Encounter (Signed)
F/u  Pt dtr following up on previous message- stated to call back on mobile #- [phone]. Please call back and discuss.

## 2015-04-14 NOTE — Telephone Encounter (Signed)
Returned pt daughter call at the tel # she requested. [email or web address]

## 2015-04-14 NOTE — ED Notes (Signed)
Ambulated, pt maintained O2 saturation at 98% RA.

## 2015-04-14 NOTE — ED Provider Notes (Signed)
CSN: [id number]     Arrival date & time 04/14/15  1232 History   First MD Initiated Contact with Patient 04/14/15 1653     Chief Complaint  Patient presents with  . Cough     (Consider location/radiation/quality/duration/timing/severity/associated sxs/prior Treatment) HPI   Patient is 82 year old female with history of A. fib, hypertension, hyperlipidemia, CHF with EF of 10%. She is presenting today with increasing shortness of breath for the last 2-3 weeks. Patient's been seen by her primary care physician. She saw them last week and they cut her metoprolol in half. Patient's had weight gain over the last 2 weeks including edema bilaterally lower extremity. She also reports a gurgling sound when breathing. She is unable to do the  house chores and get around as she usually is able to. She denies any chest pain at this time.  Past Medical History  Diagnosis Date  . Atrial fibrillation (HCC)     on coumadin since 2012, recently stopped because of vaginal bleeding  . Hypertension   . Hyperlipidemia   . Routine adult health maintenance 04/09/2012  . CHF (congestive heart failure) [facility])    Past Surgical History  Procedure Laterality Date  . No past surgeries     Family History  Problem Relation Age of Onset  . Hypertension Mother   . Hypertension Father   . Thyroid disease Neg Hx    Social History  Substance Use Topics  . Smoking status: Never Smoker   . Smokeless tobacco: Never Used  . Alcohol Use: No   OB History    No data available     Review of Systems  Constitutional: Positive for activity change and fatigue. Negative for fever.  HENT: Negative for congestion.   Respiratory: Positive for cough and shortness of breath.   Cardiovascular: Positive for leg swelling. Negative for chest pain.  Gastrointestinal: Negative for abdominal pain.  Genitourinary: Negative for dysuria.  Neurological: Positive for headaches.  Psychiatric/Behavioral: Negative for agitation.       Allergies  Shrimp  Home Medications   Prior to Admission medications   Medication Sig Start Date End Date Taking? Authorizing Provider  acetaminophen (TYLENOL) 500 MG tablet Take 500 mg by mouth every 6 (six) hours as needed for pain.   Yes Historical Provider, MD  aspirin 81 MG tablet Take 1 tablet (81 mg total) by mouth daily. 05/06/13  Yes Amy D Clegg, NP  atorvastatin (LIPITOR) 40 MG tablet TAKE 1 TABLET BY MOUTH DAILY AT 6 PM. 06/19/14  Yes Dolores Patty, MD  furosemide (LASIX) 40 MG tablet TAKE 1 TABLET BY MOUTH 2 TIMES DAILY. 06/19/14  Yes Bevelyn Buckles Bensimhon, MD  lisinopril (PRINIVIL,ZESTRIL) 10 MG tablet TAKE 1 TABLET BY MOUTH DAILY. 08/18/14  Yes Dolores Patty, MD  metoprolol succinate (TOPROL-XL) 50 MG 24 hr tablet Take 1 tablet (50 mg total) by mouth daily. Take with or immediately following a meal. 04/10/15  Yes Selina Cooley, MD  Multiple Vitamins-Minerals (CENTRUM SILVER ADULT 50+ PO) Take 1 tablet by mouth daily.   Yes Historical Provider, MD  potassium chloride SA (K-DUR,KLOR-CON) 20 MEQ tablet TAKE 1 TABLET BY MOUTH DAILY 07/08/14  Yes Dolores Patty, MD   BP 93/73 mmHg  Pulse 98  Temp(Src) 98.7 F (37.1 C) (Oral)  Resp 20  Ht 5\' 5"  (1.651 m)  Wt 158 lb 4.8 oz (71.804 kg)  BMI 26.34 kg/m2  SpO2 97%  LMP 10/12/1978 Physical Exam  Constitutional: She is oriented to person, place,  and time. She appears well-developed and well-nourished.  HENT:  Head: Normocephalic and atraumatic.  Eyes: Conjunctivae are normal. Right eye exhibits no discharge.  Neck: Neck supple.  Cardiovascular: Normal rate, regular rhythm and normal heart sounds.   No murmur heard. Pulmonary/Chest: She has wheezes.  Mild increased work of breathing.  Crackles, cardiac wheeze.  Abdominal: Soft. She exhibits no distension. There is no tenderness.  Musculoskeletal: Normal range of motion.  +3 pitting edema bilateral lower extremities  Neurological: She is oriented to person, place,  and time. No cranial nerve deficit.  Skin: Skin is warm and dry. No rash noted. She is not diaphoretic.  Psychiatric: She has a normal mood and affect. Her behavior is normal.  Nursing note and vitals reviewed.   ED Course  Procedures (including critical care time) Labs Review Labs Reviewed  COMPREHENSIVE METABOLIC PANEL - Abnormal; Notable for the following:    Chloride 97 (*)    CO2 33 (*)    Glucose, Bld 163 (*)    Calcium 8.7 (*)    Total Protein 6.1 (*)    Albumin 2.3 (*)    GFR calc non Af Amer 51 (*)    GFR calc Af Amer 59 (*)    All other components within normal limits  CBC WITH DIFFERENTIAL/PLATELET - Abnormal; Notable for the following:    Platelets 139 (*)    All other components within normal limits  BRAIN NATRIURETIC PEPTIDE - Abnormal; Notable for the following:    B Natriuretic Peptide 2772.0 (*)    All other components within normal limits  PROTIME-INR - Abnormal; Notable for the following:    Prothrombin Time 15.8 (*)    All other components within normal limits  URINALYSIS, ROUTINE W REFLEX MICROSCOPIC (NOT AT [facility]) - Abnormal; Notable for the following:    Color, Urine AMBER (*)    APPearance CLOUDY (*)    Bilirubin Urine SMALL (*)    Protein, ur 100 (*)    Leukocytes, UA SMALL (*)    All other components within normal limits  URINE MICROSCOPIC-ADD ON - Abnormal; Notable for the following:    Squamous Epithelial / LPF 0-5 (*)    Bacteria, UA MANY (*)    Casts GRANULAR CAST (*)    Crystals TRIPLE PHOSPHATE CRYSTALS (*)    All other components within normal limits  TROPONIN I - Abnormal; Notable for the following:    Troponin I 0.07 (*)    All other components within normal limits  URINE CULTURE  INFLUENZA PANEL BY PCR (TYPE A & B, H1N1)  BASIC METABOLIC PANEL  URINE RAPID DRUG SCREEN, HOSP PERFORMED  TROPONIN I  TROPONIN I  I-STAT CG4 LACTIC ACID, ED  I-STAT CG4 LACTIC ACID, ED    Imaging Review Dg Chest 2 View  04/14/2015  CLINICAL DATA:   Cough and congestion for a few days now - r/o bronchitis or PNA - hx of AFIB, CHF, HTN EXAM: CHEST  2 VIEW COMPARISON:  04/26/2013 FINDINGS: Marked enlargement of the cardiopericardial silhouette, which appears mildly increased when compared the prior study. No mediastinal or hilar masses or convincing adenopathy. Lungs are clear.  No pleural effusion or pneumothorax. Bony thorax is demineralized. Slight wedge-shaped deformity of a mid thoracic vertebra. Slight depression of the upper endplate of what appears to be L1. These findings are stable. IMPRESSION: 1. Marked enlargement of the cardiopericardial silhouette, mildly increased from the prior study. Consider the possibility of a pericardial effusion. 2. No acute findings in  the lungs. Electronically Signed   By: Amie Portland M.D.   On: 04/14/2015 14:12   I have personally reviewed and evaluated these images and lab results as part of my medical decision-making.   EKG Interpretation   Date/Time:  Tuesday April 14 2015 18:24:50 EST Ventricular Rate:  108 PR Interval:  224 QRS Duration: 138 QT Interval:  380 QTC Calculation: 509 R Axis:   -92 Text Interpretation:  Sinus tachycardia Ventricular tachycardia,  unsustained Prolonged PR interval Left atrial enlargement Nonspecific IVCD  with LAD Consider left ventricular hypertrophy Anterior Q waves, possibly  due to LVH Abnormal T, consider ischemia, lateral leads ST elevation,  consider inferior injury Baseline wander in lead(s) V6 No significant  change since last tracing Confirmed by Kandis Mannan ([id number]) on  04/14/2015 6:35:41 PM      MDM   Final diagnoses:  Acute on chronic congestive heart failure, unspecified congestive heart failure type [facility])    Patient is an 82 year old female presenting today with increasing shortness of breath, cough, edema over the last 2-3 weeks. Patient seen by primary care physician last week and had her metoprolol cut in half. Patient notes no fever.     It is possible this patient has some kind of infectious etiology, flu versus pneumonia. However in the absence of fever this is less likely.  Concerned about a gradual CHF were seen in the setting of a low EF of 10-15%. Patient's been taking oral Lasix 40 mg twice a day but has not been helping. We will give her IV Lasix now.  Patient's chest x-ray says mild enlargement of cardiac silhouette. Therefore we did an ultrasound to make sure the patient does not have tamponade physiology.  Will admit for IV diuretics.      EMERGENCY DEPARTMENT [place] CARDIAC EXAM "Study: Limited Ultrasound of the heart and pericardium"  INDICATIONS:Dyspnea Multiple views of the heart and pericardium were obtained in real-time with a multi-frequency probe.  PERFORMED NG:EXBMWU  IMAGES ARCHIVED?: Yes  FINDINGS: Small effusion  VIEWS USED: Parasternal long axis  INTERPRETATION: Cardiac activity present and Cardiac tamponade absent Emergency Ultrasound: Limited Thoracic Performed and interpreted byDr [facility] Longitudinal view of anterior left and right lung fields in real-time with linear probe. Indication: SOB, crackles  Findings: + lung sliding +B lines Interpretation: No evidence of pneumothorax. Images electronically archived.       Coree Riester Randall An, MD 04/15/15 1324

## 2015-04-14 NOTE — Telephone Encounter (Signed)
New Message:  Pt c/o swelling: STAT is pt has developed SOB within 24 hours  1. How long have you been experiencing swelling? A couple of days   2. Where is the swelling located? Legs and feet  3.  Are you currently taking a "fluid pill"? Furosemide 40mg  BID  4.  Are you currently SOB? Did not say but she has a bad cough along with a lot of phlem  5.  Have you traveled recently? Did not state

## 2015-04-14 NOTE — Telephone Encounter (Signed)
Follow up ° ° ° ° °Returned a call to the nurse °

## 2015-04-14 NOTE — H&P (Signed)
Date: 04/14/2015               Patient Name:  Jean Moore MRN: [id number]  DOB: March 19, 1926 Age / Sex: 82 y.o., female   PCP: Yolanda Manges, DO         Medical Service: Internal Medicine Teaching Service         Attending Physician: Dr. Randall Hiss, MD    First Contact: Dr. Orest Dikes Pager: [phone]  Second Contact: Dr. Gara Kroner Pager: [phone]       After Hours (After 5p/  First Contact Pager: [phone]  weekends / holidays): Second Contact Pager: [phone]   Chief Complaint: Shortness of Breath  History of Present Illness: [name] is an 82 year old female with a history of CHF with EF 10-15%, PAF not on anticoagulation, HTN, HLD presenting today with shortness of breath and cough. She reports that over the past 3 weeks she has had worsening dyspnea on exertion. She reports at baseline she is able to ambulate without dyspnea or difficulties. Over the past several weeks she has been unable to do chores around the house due to dyspnea. She was seen in clinic 3/3 with similar symptoms. Her weight was stable from her baseline at that time and appeared euvolemic on exam. She was mildly hypotensive at 100/70 at that time and her metoprolol was decreased from 100 mg to 50 mg daily and her lasix were continued at 40 mg bid. Since that visit she has had no improvement in her symptoms with worsening leg edema and cough. States she will not take her lasix dose at home if she is going to be out of the house as well as dietary indiscretion with salty foods. Weighs herself daily and reports her dry weight at 153 lbs that has been slowly increasing to 158 lb today. Previous clinic visits have her weight ~163 lbs. She reports a productive cough with yellow sputum for the past 2 weeks. Denies any fevers, chills, nausea, vomiting, diarrhea or dysuria. She does report left sided pleuritic dull chest pain that is worse with coughing. No recent travel or sick contacts.   Meds: Current  Facility-Administered Medications  Medication Dose Route Frequency Provider Last Rate Last Dose  . 0.9 %  sodium chloride infusion  250 mL Intravenous PRN Valentino Nose, MD      . acetaminophen (TYLENOL) tablet 650 mg  650 mg Oral Q4H PRN Valentino Nose, MD      . Melene Muller ON 04/15/2015] aspirin chewable tablet 81 mg  81 mg Oral Daily Yolanda Manges, DO      . atorvastatin (LIPITOR) tablet 40 mg  40 mg Oral q1800 Yolanda Manges, DO   40 mg at 04/14/15 2217  . dextromethorphan-guaiFENesin (MUCINEX DM) 30-600 MG per 12 hr tablet 1 tablet  1 tablet Oral BID Valentino Nose, MD   1 tablet at 04/14/15 2218  . diclofenac sodium (VOLTAREN) 1 % transdermal gel 2 g  2 g Topical QID Valentino Nose, MD   2 g at 04/14/15 2240  . enoxaparin (LOVENOX) injection 40 mg  40 mg Subcutaneous Q24H Valentino Nose, MD   40 mg at 04/14/15 2218  . [START ON 04/15/2015] furosemide (LASIX) injection 40 mg  40 mg Intravenous BID Valentino Nose, MD      . Melene Muller ON 04/15/2015] lisinopril (PRINIVIL,ZESTRIL) tablet 10 mg  10 mg Oral Daily Yolanda Manges, DO      . [START ON 04/15/2015] metoprolol succinate (  TOPROL-XL) 24 hr tablet 50 mg  50 mg Oral Daily Yolanda Manges, DO      . [START ON 04/15/2015] multivitamin with minerals tablet 1 tablet  1 tablet Oral Daily Yolanda Manges, DO      . sodium chloride flush (NS) 0.9 % injection 3 mL  3 mL Intravenous Q12H Valentino Nose, MD   3 mL at 04/14/15 2219  . sodium chloride flush (NS) 0.9 % injection 3 mL  3 mL Intravenous PRN Valentino Nose, MD        Allergies: Allergies as of 04/14/2015 - Review Complete 04/14/2015  Allergen Reaction Noted  . Shrimp [shellfish allergy]  10/12/2011   Past Medical History  Diagnosis Date  . Atrial fibrillation (HCC)     on coumadin since 2012, recently stopped because of vaginal bleeding  . Hypertension   . Hyperlipidemia   . Routine adult health maintenance 04/09/2012  . CHF (congestive heart failure) [facility])    Past Surgical History  Procedure  Laterality Date  . No past surgeries     Family History  Problem Relation Age of Onset  . Hypertension Mother   . Hypertension Father   . Thyroid disease Neg Hx    Social History   Social History  . Marital Status: Widowed    Spouse Name: N/A  . Number of Children: N/A  . Years of Education: N/A   Occupational History  . Not on file.   Social History Main Topics  . Smoking status: Never Smoker   . Smokeless tobacco: Never Used  . Alcohol Use: No  . Drug Use: No  . Sexual Activity: Not on file   Other Topics Concern  . Not on file   Social History Narrative    Review of Systems: Pertinent items noted in HPI and remainder of comprehensive ROS otherwise negative.  Physical Exam: Blood pressure 93/73, pulse 98, temperature 98.7 F (37.1 C), temperature source Oral, resp. rate 20, height  (1.651 m), weight 158 lb 4.8 oz (71.804 kg), last menstrual period 10/12/1978, SpO2 97 %. General: alert, well-developed, and cooperative to examination. Head: normocephalic and atraumatic.  Eyes: vision grossly intact, pupils equal, pupils round, pupils reactive to light, no injection and anicteric.  Mouth: pharynx pink and moist, no erythema, and no exudates.  Neck: supple, full ROM, no thyromegaly, JVD, and no carotid bruits.  Lungs: normal respiratory effort, no accessory muscle use, coarse breath sounds bilaterally, no crackles, and no wheezes. Heart: normal rate, regular rhythm, systolic murmur, no gallop, and no rub.  Abdomen: soft, non-tender, normal bowel sounds, no distention, no guarding Msk: no joint swelling, no joint warmth, and no redness over joints.  Pulses: 2+ DP/PT pulses bilaterally Extremities: 2+ pitting edema to mid shins bilaterally, R>L Neurologic: alert & oriented X3, cranial nerves II-XII intact, no focal deficits Skin: turgor normal and no rashes.  Psych: normal mood and affect  Lab results: Basic Metabolic Panel:  Recent Labs  16/10/96 1327  NA  138  K 4.1  CL 97*  CO2 33*  GLUCOSE 163*  BUN 20  CREATININE 0.98  CALCIUM 8.7*   Liver Function Tests:  Recent Labs  04/14/15 1327  AST 24  ALT 20  ALKPHOS 46  BILITOT 0.8  PROT 6.1*  ALBUMIN 2.3*   CBC:  Recent Labs  04/14/15 1327  WBC 5.8  NEUTROABS 3.7  HGB 12.7  HCT 40.4  MCV 88.0  PLT 139*   Cardiac Enzymes:  Recent Labs  04/14/15 2150  TROPONINI 0.07*   Coagulation:  Recent Labs  04/14/15 1756  LABPROT 15.8*  INR 1.25   Urinalysis:  Recent Labs  04/14/15 1856  COLORURINE AMBER*  LABSPEC 1.026  PHURINE 7.5  GLUCOSEU NEGATIVE  HGBUR NEGATIVE  BILIRUBINUR SMALL*  KETONESUR NEGATIVE  PROTEINUR 100*  NITRITE NEGATIVE  LEUKOCYTESUR SMALL*   Imaging results:  Dg Chest 2 View  04/14/2015  CLINICAL DATA:  Cough and congestion for a few days now - r/o bronchitis or PNA - hx of AFIB, CHF, HTN EXAM: CHEST  2 VIEW COMPARISON:  04/26/2013 FINDINGS: Marked enlargement of the cardiopericardial silhouette, which appears mildly increased when compared the prior study. No mediastinal or hilar masses or convincing adenopathy. Lungs are clear.  No pleural effusion or pneumothorax. Bony thorax is demineralized. Slight wedge-shaped deformity of a mid thoracic vertebra. Slight depression of the upper endplate of what appears to be L1. These findings are stable. IMPRESSION: 1. Marked enlargement of the cardiopericardial silhouette, mildly increased from the prior study. Consider the possibility of a pericardial effusion. 2. No acute findings in the lungs. Electronically Signed   By: Amie Portland M.D.   On: 04/14/2015 14:12    Other results: EKG: sinus tachycardia with 1st degree AV block and unchanged left anterior fascicular block  Assessment & Plan by Problem:  Systolic CHF exacerbation: Patient with 3 week history of progressive dyspnea with exertion with bilateral lower extremity edema. Patient with no shortness of breath at baseline. 04/2013 ECHO with EF  10-15% (declined ICD placement in the past). BNP elevated at 2700. Patient reports dry weight ~153 lbs at home with gradual increase in weight to 158 lbs today though clinic notes shows weights 163-165 lbs. CXR was notable for an enlargement of her cardiac silhouette. Bedside ultrasound was done that was negative for tamponade. Patient reports missing doses of lasix (unclear how often) if she is going to be out of the house as well as eating foods with high salt content (bacon, sausage). Patient does endorse a productive cough with yellow sputum. Denies any fevers, chills, myalgias or any other systemic signs of infection. Afebrile with no leukocytosis here. CXR with no consolidations. Flu negative. Likely mild CHF exacerbation in the setting of non-compliance and dietary indiscretion. Received 1 dose of Lasix 40 mg IV in the ED. -Lasix 40 mg IV bid  -Strict I&Os -Daily weights -BMP in am -Continue home metoprolol 50 mg daily -Continue lisinopril 10 mg daily -Continue ASA 81 mg daily -ECHO in am -Keep O2 sats >92% -Mucinex prn  Pleuritic Chest Pain: Patient complaining of left sided chest pain. Reports the pain is dull in nature under her left rib cage. Exacerbated by coughing. EKG with no acute ischemic changes. Initial troponin 0.07 (0.3 a year ago). Suspect MSK pain in the setting of coughing.  -trend troponins -Voltaren gel -Mucinex   Asymmetric Leg Edema: Patient with bilateral LE 2+ pitting edema. R>L only minimally (<3cm). Patient with no calf pain, no recent immobility or surgeries. Well Score of negative 1. D-dimer unlikely to be useful in the setting of CHF. Low suspicion for DVT at this time as edema is most likely 2/2 CHF. Will monitor response to fluids and consider dopplers if no improvement.   PAF: Patient previously on warfarin but stopped 2/2 vaginal bleeding. Only on ASA 81 mg at this time. Her CHADS-VASc score of 5. Patient is highly function and independent. Would likely  benefit from anticoagulation with a DOAC if agreeable. Will  discuss prior to discharge and will need follow up outpatient.   HTN: BP soft 90-100s systolic. HR 90-100s. Patient metoprolol was recently decreased from 100 mg to 50 mg daily at clinic visit 2/2 similar borderline hypotension. She is also on lisinopril 10 mg daily. Reports she has not taken any of her home medications today. -Continue metoprolol 50 mg and lisinopril 10 mg starting tomorrow  HLD: Patient is on atrovastain 40 mg qhs at home. -Continue home dose medications  DVT ppx: Lovenox  Dispo: Disposition is deferred at this time, awaiting improvement of current medical problems. Anticipated discharge in approximately 2-3 day(s).   The patient does have a current PCP Yolanda Manges, DO) and does need an [facility] hospital follow-up appointment after discharge.  The patient does not have transportation limitations that hinder transportation to clinic appointments.  Signed: Valentino Nose, MD 04/14/2015, 10:54 PM

## 2015-04-14 NOTE — ED Notes (Signed)
Pt reports cough, and chest congestion for the past couple of weeks. Was recently at the PCP office but has not been feeling better.

## 2015-04-15 ENCOUNTER — Inpatient Hospital Stay (HOSPITAL_COMMUNITY): Payer: Medicare Other

## 2015-04-15 DIAGNOSIS — I480 Paroxysmal atrial fibrillation: Secondary | ICD-10-CM

## 2015-04-15 DIAGNOSIS — I110 Hypertensive heart disease with heart failure: Principal | ICD-10-CM

## 2015-04-15 DIAGNOSIS — I509 Heart failure, unspecified: Secondary | ICD-10-CM | POA: Insufficient documentation

## 2015-04-15 DIAGNOSIS — R091 Pleurisy: Secondary | ICD-10-CM | POA: Insufficient documentation

## 2015-04-15 DIAGNOSIS — I5023 Acute on chronic systolic (congestive) heart failure: Secondary | ICD-10-CM

## 2015-04-15 DIAGNOSIS — E785 Hyperlipidemia, unspecified: Secondary | ICD-10-CM

## 2015-04-15 DIAGNOSIS — R600 Localized edema: Secondary | ICD-10-CM

## 2015-04-15 LAB — ECHOCARDIOGRAM COMPLETE
AOPV: 0.19 m/s
AOVTI: 8.42 cm
AV vel: 0.57
AVG: 15 mmHg
AVPG: 30 mmHg
CHL CUP AV PEAK INDEX: 0.33
CHL CUP AV VALUE AREA INDEX: 0.32
CHL CUP STROKE VOLUME: 26 mL
DOP CAL AO MEAN VELOCITY: 178 cm/s
FS: 5 % — AB (ref 28–44)
Height: 65 in
IV/PV OW: 0.76
IVS: 0.9 cm (ref 0.6–1.1)
LEFT ATRIUM END SYS DIAM: 3.8 cm
LVIDD: 5.6 cm (ref 3.5–6.0)
LVIDS: 5.3 cm (ref 2.1–4.0)
LVOT area: 3.14 cm2
LVOTPV: 52.6 m/s
LVOTVTI: 0.18 cm
MV VTI: 156 cm
PW: 1.3 mm — AB (ref 0.6–1.1)
Valve area: 0.57 cm2
WEIGHTICAEL: 2553.6 [oz_av]

## 2015-04-15 LAB — BASIC METABOLIC PANEL
Anion gap: 8 (ref 5–15)
BUN: 22 mg/dL — AB (ref 6–20)
CHLORIDE: 96 mmol/L — AB (ref 101–111)
CO2: 32 mmol/L (ref 22–32)
CREATININE: 1.07 mg/dL — AB (ref 0.44–1.00)
Calcium: 8.4 mg/dL — ABNORMAL LOW (ref 8.9–10.3)
GFR calc non Af Amer: 46 mL/min — ABNORMAL LOW (ref >60)
GFR, EST AFRICAN AMERICAN: 53 mL/min — AB (ref >60)
GLUCOSE: 211 mg/dL — AB (ref 65–99)
Potassium: 4.1 mmol/L (ref 3.5–5.1)
Sodium: 136 mmol/L (ref 135–145)

## 2015-04-15 LAB — RAPID URINE DRUG SCREEN, HOSP PERFORMED
AMPHETAMINES: NOT DETECTED
BARBITURATES: NOT DETECTED
Benzodiazepines: NOT DETECTED
Cocaine: NOT DETECTED
Opiates: NOT DETECTED
Tetrahydrocannabinol: NOT DETECTED

## 2015-04-15 LAB — TROPONIN I
TROPONIN I: 0.08 ng/mL — AB (ref <0.031)
TROPONIN I: 0.06 ng/mL — AB (ref <0.031)

## 2015-04-15 LAB — TSH: TSH: 0.413 u[IU]/mL (ref 0.350–4.500)

## 2015-04-15 MED ORDER — MELATONIN 3 MG PO TABS
1.5000 mg | ORAL_TABLET | Freq: Once | ORAL | Status: AC
  Administered 2015-04-15: 1.5 mg via ORAL
  Filled 2015-04-15: qty 0.5

## 2015-04-15 MED ORDER — MELATONIN 3 MG PO TABS
1.5000 mg | ORAL_TABLET | Freq: Once | ORAL | Status: AC
  Administered 2015-04-15: 1.5 mg via ORAL
  Filled 2015-04-15 (×2): qty 0.5

## 2015-04-15 MED ORDER — MELATONIN 3 MG PO TABS
3.0000 mg | ORAL_TABLET | Freq: Once | ORAL | Status: DC
  Filled 2015-04-15: qty 1

## 2015-04-15 NOTE — Progress Notes (Signed)
  Echocardiogram 2D Echocardiogram has been performed.  Cathie Beams 04/15/2015, 11:31 AM

## 2015-04-15 NOTE — Progress Notes (Signed)
Subjective: No acute events overnight. Patient's breathing is much improved this morning, resting comfortably on room air. Fair but not extensive UOP on lasix x1 dose. No discomfort but comments on right leg swollen > left which has been ongoing for several days to weeks.  Objective: Vital signs in last 24 hours: Filed Vitals:   04/14/15 1917 04/14/15 1945 04/15/15 0037 04/15/15 0615  BP:  93/73 94/76 118/70  Pulse:  98 98 90  Temp:   98.1 F (36.7 C) 98 F (36.7 C)  TempSrc:   Oral Oral  Resp:  20 20 20   Height:  5\' 5"  (1.651 m)    Weight: 78.104 kg (172 lb 3 oz) 71.804 kg (158 lb 4.8 oz)  72.394 kg (159 lb 9.6 oz)  SpO2:  97% 98% 97%   Weight change:   Intake/Output Summary (Last 24 hours) at 04/15/15 1113 Last data filed at 04/15/15 1038  Gross per 24 hour  Intake    435 ml  Output    750 ml  Net   -315 ml   GENERAL- alert, co-operative, NAD HEENT- Atraumatic, oral mucosa appears moist, positive JVD not quantified CARDIAC- RRR, systolic murmur over LUSB RESP- CTAB, faint crackles b/l, good air movement EXTREMITIES- pitting edema from ankle halfway to knee, 2+ right 1+ left SKIN- Warm, dry, No rash or lesion. PSYCH- Normal mood and affect, appropriate thought content and speech.   Lab Results: Basic Metabolic Panel:  Recent Labs Lab 04/14/15 1327 04/15/15 0430  NA 138 136  K 4.1 4.1  CL 97* 96*  CO2 33* 32  GLUCOSE 163* 211*  BUN 20 22*  CREATININE 0.98 1.07*  CALCIUM 8.7* 8.4*   Liver Function Tests:  Recent Labs Lab 04/14/15 1327  AST 24  ALT 20  ALKPHOS 46  BILITOT 0.8  PROT 6.1*  ALBUMIN 2.3*   No results for input(s): LIPASE, AMYLASE in the last 168 hours. No results for input(s): AMMONIA in the last 168 hours. CBC:  Recent Labs Lab 04/14/15 1327  WBC 5.8  NEUTROABS 3.7  HGB 12.7  HCT 40.4  MCV 88.0  PLT 139*   Cardiac Enzymes:  Recent Labs Lab 04/14/15 2150 04/15/15 0430 04/15/15 0949  TROPONINI 0.07* 0.08* 0.06*    BNP: No results for input(s): PROBNP in the last 168 hours. D-Dimer: No results for input(s): DDIMER in the last 168 hours. CBG: No results for input(s): GLUCAP in the last 168 hours. Hemoglobin A1C: No results for input(s): HGBA1C in the last 168 hours. Fasting Lipid Panel: No results for input(s): CHOL, HDL, LDLCALC, TRIG, CHOLHDL, LDLDIRECT in the last 168 hours. Thyroid Function Tests:  Recent Labs Lab 04/15/15 0440  TSH 0.413   Coagulation:  Recent Labs Lab 04/14/15 1756  LABPROT 15.8*  INR 1.25   Anemia Panel: No results for input(s): VITAMINB12, FOLATE, FERRITIN, TIBC, IRON, RETICCTPCT in the last 168 hours. Urine Drug Screen: Drugs of Abuse     Component Value Date/Time   LABOPIA NONE DETECTED 04/14/2015 2354   COCAINSCRNUR NONE DETECTED 04/14/2015 2354   LABBENZ NONE DETECTED 04/14/2015 2354   AMPHETMU NONE DETECTED 04/14/2015 2354   THCU NONE DETECTED 04/14/2015 2354   LABBARB NONE DETECTED 04/14/2015 2354    Alcohol Level: No results for input(s): ETH in the last 168 hours. Urinalysis:  Recent Labs Lab 04/14/15 1856  COLORURINE AMBER*  LABSPEC 1.026  PHURINE 7.5  GLUCOSEU NEGATIVE  HGBUR NEGATIVE  BILIRUBINUR SMALL*  KETONESUR NEGATIVE  PROTEINUR 100*  NITRITE  NEGATIVE  LEUKOCYTESUR SMALL*    Micro Results: No results found for this or any previous visit (from the past 240 hour(s)). Studies/Results: Dg Chest 2 View  04/14/2015  CLINICAL DATA:  Cough and congestion for a few days now - r/o bronchitis or PNA - hx of AFIB, CHF, HTN EXAM: CHEST  2 VIEW COMPARISON:  04/26/2013 FINDINGS: Marked enlargement of the cardiopericardial silhouette, which appears mildly increased when compared the prior study. No mediastinal or hilar masses or convincing adenopathy. Lungs are clear.  No pleural effusion or pneumothorax. Bony thorax is demineralized. Slight wedge-shaped deformity of a mid thoracic vertebra. Slight depression of the upper endplate of what  appears to be L1. These findings are stable. IMPRESSION: 1. Marked enlargement of the cardiopericardial silhouette, mildly increased from the prior study. Consider the possibility of a pericardial effusion. 2. No acute findings in the lungs. Electronically Signed   By: Amie Portland M.D.   On: 04/14/2015 14:12   Medications: I have reviewed the patient's current medications. Scheduled Meds: . aspirin  81 mg Oral Daily  . atorvastatin  40 mg Oral q1800  . dextromethorphan-guaiFENesin  1 tablet Oral BID  . diclofenac sodium  2 g Topical QID  . enoxaparin (LOVENOX) injection  40 mg Subcutaneous Q24H  . furosemide  40 mg Intravenous BID  . lisinopril  10 mg Oral Daily  . metoprolol succinate  50 mg Oral Daily  . multivitamin with minerals  1 tablet Oral Daily  . sodium chloride flush  3 mL Intravenous Q12H   Continuous Infusions:  PRN Meds:.sodium chloride, acetaminophen, sodium chloride flush Assessment/Plan: Systolic CHF exacerbation: Patient with 3 week history of progressive dyspnea with exertion with bilateral lower extremity edema. 04/2013 ECHO with EF 10-15% (declined ICD placement in the past). BNP elevated at 2700. Medication noncompliance on several agents including lasix. Improving today. She plans to start living with family members who can assist her after discharge. We will see from clinical response what level of diuresis she needs to go home on. If she is not requiring oxygen support by end of day can likely go home tomorrow. -Lasix 40 mg IV bid  -Strict I&Os -Daily weights -BMP in am -Continue home metoprolol 50 mg daily -Continue lisinopril 10 mg daily -Continue ASA 81 mg daily -TTE -Keep O2 sats >92% -Mucinex prn  Pleuritic Chest Pain: Pain improved, also decrease in coughing that has been ongoing for several days. Troponins with a mild elevation consistent with heart failure exacerbation. -trend troponins -Voltaren gel -Mucinex   Asymmetric Leg Edema: Patient with  bilateral LE 2+ pitting edema. R>L. Patient with no calf pain, no recent immobility or surgeries. I have very low suspicion for DVT since she has increased edema on both legs, no pain, no discoloration, no precipitating events, and no previous clots. If this changed or worsened would pursue LE doppler US.  PAF: Patient previously on warfarin but stopped 2/2 vaginal bleeding. Only on ASA 81 mg at this time. Her CHADS-VASc score of 5. Patient is highly function and independent. May be a candidate for anticoagulation on a DOAC, will discuss PTA and have her follow up at PCP.  HTN: BP improved today to 110s systolic, continuing home regimen -Continue metoprolol 50 mg and lisinopril 10 mg  HLD: Patient is on atrovastain 40 mg qhs at home. -Continue home dose medications  Diet: Heart healthy DVT ppx: Lovenox FULL CODE  Dispo: Disposition is deferred at this time, awaiting improvement of current medical problems. Anticipated  discharge in approximately 1-2 day(s).   The patient does have a current PCP Yolanda Manges, DO) and does need an [facility] hospital follow-up appointment after discharge.  The patient does not have transportation limitations that hinder transportation to clinic appointments.    LOS: 1 day   Fuller Plan, MD 04/15/2015, 11:13 AM

## 2015-04-15 NOTE — Progress Notes (Signed)
Transported to vascular lab for 2-D echo.  Test explained to patient and her family members at bedside

## 2015-04-15 NOTE — Progress Notes (Signed)
04/15/2015 10:29 AM Hemodialysis Outpatient Note; This patient has been accepted at the [facility] on a Tuesday, Thursday and Saturday 2nd shift schedule. The center can begin treatment on Saturday March 11th at 10:30 AM ONLY if patient can sign paperwork and consents on Friday. Thank you. Tilman Neat

## 2015-04-16 LAB — BASIC METABOLIC PANEL
Anion gap: 10 (ref 5–15)
BUN: 23 mg/dL — AB (ref 6–20)
CALCIUM: 8.2 mg/dL — AB (ref 8.9–10.3)
CO2: 30 mmol/L (ref 22–32)
Chloride: 92 mmol/L — ABNORMAL LOW (ref 101–111)
Creatinine, Ser: 0.97 mg/dL (ref 0.44–1.00)
GFR calc Af Amer: 60 mL/min — ABNORMAL LOW (ref >60)
GFR, EST NON AFRICAN AMERICAN: 51 mL/min — AB (ref >60)
Glucose, Bld: 176 mg/dL — ABNORMAL HIGH (ref 65–99)
POTASSIUM: 3.7 mmol/L (ref 3.5–5.1)
Sodium: 132 mmol/L — ABNORMAL LOW (ref 135–145)

## 2015-04-16 LAB — URINE CULTURE

## 2015-04-16 NOTE — Evaluation (Signed)
Physical Therapy Evaluation Patient Details Name: Jean Moore MRN: [id number] DOB: 1927-01-31 Today's Date: 04/16/2015   History of Present Illness  82 yo female with onset of pleuritic chest pain and mild increase in troponin from CHF was admitted to hospital.  EF 10-15% and not on anticoagulants.    Clinical Impression  Pt was able to walk but while O2 sats were stable and 97% or higher, pulse elevated to 112 after walking and will need to be monitored for her medical safety.  Pt is quite tired and did not sleep well, may be impacting her values.    Follow Up Recommendations Supervision/Assistance - 24 hour;SNF    Equipment Recommendations  None recommended by PT    Recommendations for Other Services       Precautions / Restrictions Precautions Precautions: Fall (telemetry) Restrictions Weight Bearing Restrictions: No      Mobility  Bed Mobility Overal bed mobility: Needs Assistance Bed Mobility: Supine to Sit;Sit to Supine     Supine to sit: Min assist Sit to supine: Min assist   General bed mobility comments: helped pt with trunk and LE's  Transfers Overall transfer level: Needs assistance Equipment used: Rolling walker (2 wheeled);1 person hand held assist Transfers: Sit to/from [employer] Sit to Stand: Min assist Stand pivot transfers: Min assist          Ambulation/Gait Ambulation/Gait assistance: Min guard;Min assist Ambulation Distance (Feet): 90 Feet Assistive device: Rolling walker (2 wheeled);1 person hand held assist Gait Pattern/deviations: Step-through pattern;Trunk flexed;Wide base of support Gait velocity: reduced Gait velocity interpretation: Below normal speed for age/gender General Gait Details: pt is tired from effort and struggles to pivot on walker  Stairs            Wheelchair Mobility    Modified Rankin (Stroke Patients Only)       Balance                                              Pertinent Vitals/Pain Pain Assessment: Faces Faces Pain Scale: Hurts little more Pain Location: L ribcage from coughing Pain Descriptors / Indicators: Dull Pain Intervention(s): Monitored during session;Premedicated before session    Home Living Family/patient expects to be discharged to:: Private residence Living Arrangements: Children Available Help at Discharge: Family;Available 24 hours/day Type of Home: House Home Access: Stairs to enter Entrance Stairs-Rails: Can reach both;Left;Right Entrance Stairs-Number of Steps: 5 Home Layout: One level Home Equipment: Walker - 2 wheels;Bedside commode      Prior Function Level of Independence: Independent with assistive device(s)               Hand Dominance        Extremity/Trunk Assessment   Upper Extremity Assessment: Generalized weakness           Lower Extremity Assessment: Generalized weakness      Cervical / Trunk Assessment: Kyphotic  Communication   Communication: Expressive difficulties (mildly slurred speech)  Cognition Arousal/Alertness: Awake/alert Behavior During Therapy: WFL for tasks assessed/performed Overall Cognitive Status: Within Functional Limits for tasks assessed       Memory: Decreased short-term memory              General Comments General comments (skin integrity, edema, etc.): Pt is able to sit bedside with S but is unsafe to leave as she is tired and had elevated pulse  from gait    Exercises        Assessment/Plan    PT Assessment Patient needs continued PT services  PT Diagnosis Difficulty walking   PT Problem List Decreased strength;Decreased range of motion;Decreased activity tolerance;Decreased balance;Decreased mobility;Decreased coordination;Decreased cognition;Decreased knowledge of use of DME;Decreased safety awareness;Decreased knowledge of precautions;Cardiopulmonary status limiting activity;Obesity;Pain;Decreased skin integrity  PT Treatment  Interventions DME instruction;Gait training;Functional mobility training;Therapeutic activities;Therapeutic exercise;Balance training;Neuromuscular re-education;Cognitive remediation;Wheelchair mobility training   PT Goals (Current goals can be found in the Care Plan section) Acute Rehab PT Goals Patient Stated Goal: to feel better and stop coughing PT Goal Formulation: With patient/family Time For Goal Achievement: 04/30/15 Potential to Achieve Goals: Good    Frequency Min 2X/week   Barriers to discharge Inaccessible home environment has help but is more dependent on continual assist when up than previously    Co-evaluation               End of Session Equipment Utilized During Treatment: Gait belt Activity Tolerance: Patient tolerated treatment well;Patient limited by pain;Patient limited by lethargy;Patient limited by fatigue Patient left: in bed;with call bell/phone within reach;with bed alarm set;with family/visitor present;Other (comment) (nursing notified pt needed to be cleaned up) Nurse Communication: Mobility status         Time: 4098-1191 PT Time Calculation (min) (ACUTE ONLY): 26 min   Charges:   PT Evaluation $PT Eval Moderate Complexity: 1 Procedure PT Treatments $Gait Training: 8-22 mins   PT G Codes:        Ivar Drape 04/17/2015, 4:40 PM   Samul Dada, PT MS Acute Rehab Dept. Number: [facility] [id number] and [facility] [phone]

## 2015-04-16 NOTE — Progress Notes (Signed)
  Date: 04/16/2015  Patient name: Jean Moore  Medical record number: [id number]  Date of birth: 03-Mar-1926   This patient's plan of care was discussed with the house staff. Please see their note for complete details. I concur with their findings.  [name] seems better with re to her breathing. Apparently she slept poorly due to what sounded like "sun downing" last night.  If her BP remain soft I would dc her ACEI and keep the beta blocker and diuretic. Will need to convert back to po dose of lasix at DC. We would like PT to assess her before we would feel comfortable DC her from hospital to stay with her daughter.    Randall Hiss, MD 04/16/2015, 2:21 PM

## 2015-04-16 NOTE — Progress Notes (Signed)
PT Cancellation Note  Patient Details Name: Jean Moore MRN: [id number] DOB: 19-Dec-1926   Cancelled Treatment:    Reason Eval/Treat Not Completed: Other (comment) (Family asked PT not to see her due to fatigue, ) so will try later if time allows.   Ivar Drape 04/16/2015, 12:28 PM   Samul Dada, PT MS Acute Rehab Dept. Number: [facility] [id number] and [facility] [phone]

## 2015-04-16 NOTE — Progress Notes (Signed)
Subjective: Patient slept poorly overnight with some agitation and confusion. Breathing well on room air. Cough is partially improved compared to yesterday.  Objective: Vital signs in last 24 hours: Filed Vitals:   04/16/15 0039 04/16/15 0526 04/16/15 0602 04/16/15 1301  BP: 108/69  101/74 94/62  Pulse: 95  108 87  Temp: 98.3 F (36.8 C)  98.6 F (37 C) 97.6 F (36.4 C)  TempSrc: Oral  Oral Oral  Resp: Height:      Weight:  72.621 kg (160 lb 1.6 oz)    SpO2: 97%  95% 98%   Weight change: -5.483 kg (-12 lb 1.4 oz)  Intake/Output Summary (Last 24 hours) at 04/16/15 1313 Last data filed at 04/16/15 1106  Gross per 24 hour  Intake    960 ml  Output    800 ml  Net    160 ml   GENERAL- alert, co-operative, NAD HEENT- Atraumatic, oral mucosa appears moist, positive JVD not quantified CARDIAC- RRR, systolic murmur over LUSB RESP- CTAB, faint crackles b/l, good air movement EXTREMITIES- pitting edema from ankle halfway to knee, 2+ right 1+ left SKIN- Warm, dry, No rash or lesion. PSYCH- Normal mood and affect, appropriate thought content and speech.   Lab Results: Basic Metabolic Panel:  Recent Labs Lab 04/15/15 0430 04/16/15 0509  NA 136 132*  K 4.1 3.7  CL 96* 92*  CO2 32 30  GLUCOSE 211* 176*  BUN 22* 23*  CREATININE 1.07* 0.97  CALCIUM 8.4* 8.2*   Liver Function Tests:  Recent Labs Lab 04/14/15 1327  AST 24  ALT 20  ALKPHOS 46  BILITOT 0.8  PROT 6.1*  ALBUMIN 2.3*   No results for input(s): LIPASE, AMYLASE in the last 168 hours. No results for input(s): AMMONIA in the last 168 hours. CBC:  Recent Labs Lab 04/14/15 1327  WBC 5.8  NEUTROABS 3.7  HGB 12.7  HCT 40.4  MCV 88.0  PLT 139*   Cardiac Enzymes:  Recent Labs Lab 04/14/15 2150 04/15/15 0430 04/15/15 0949  TROPONINI 0.07* 0.08* 0.06*   BNP: No results for input(s): PROBNP in the last 168 hours. D-Dimer: No results for input(s): DDIMER in the last 168  hours. CBG: No results for input(s): GLUCAP in the last 168 hours. Hemoglobin A1C: No results for input(s): HGBA1C in the last 168 hours. Fasting Lipid Panel: No results for input(s): CHOL, HDL, LDLCALC, TRIG, CHOLHDL, LDLDIRECT in the last 168 hours. Thyroid Function Tests:  Recent Labs Lab 04/15/15 0440  TSH 0.413   Coagulation:  Recent Labs Lab 04/14/15 1756  LABPROT 15.8*  INR 1.25   Anemia Panel: No results for input(s): VITAMINB12, FOLATE, FERRITIN, TIBC, IRON, RETICCTPCT in the last 168 hours. Urine Drug Screen: Drugs of Abuse     Component Value Date/Time   LABOPIA NONE DETECTED 04/14/2015 2354   COCAINSCRNUR NONE DETECTED 04/14/2015 2354   LABBENZ NONE DETECTED 04/14/2015 2354   AMPHETMU NONE DETECTED 04/14/2015 2354   THCU NONE DETECTED 04/14/2015 2354   LABBARB NONE DETECTED 04/14/2015 2354    Alcohol Level: No results for input(s): ETH in the last 168 hours. Urinalysis:  Recent Labs Lab 04/14/15 1856  COLORURINE AMBER*  LABSPEC 1.026  PHURINE 7.5  GLUCOSEU NEGATIVE  HGBUR NEGATIVE  BILIRUBINUR SMALL*  KETONESUR NEGATIVE  PROTEINUR 100*  NITRITE NEGATIVE  LEUKOCYTESUR SMALL*    Micro Results: Recent Results (from the past 240 hour(s))  Urine culture     Status: None (Preliminary result)  Collection Time: 04/14/15  6:55 PM  Result Value Ref Range Status   Specimen Description URINE, RANDOM  Final   Special Requests NONE  Final   Culture CULTURE REINCUBATED FOR BETTER GROWTH  Final   Report Status PENDING  Incomplete   Studies/Results: Dg Chest 2 View  04/14/2015  CLINICAL DATA:  Cough and congestion for a few days now - r/o bronchitis or PNA - hx of AFIB, CHF, HTN EXAM: CHEST  2 VIEW COMPARISON:  04/26/2013 FINDINGS: Marked enlargement of the cardiopericardial silhouette, which appears mildly increased when compared the prior study. No mediastinal or hilar masses or convincing adenopathy. Lungs are clear.  No pleural effusion or  pneumothorax. Bony thorax is demineralized. Slight wedge-shaped deformity of a mid thoracic vertebra. Slight depression of the upper endplate of what appears to be L1. These findings are stable. IMPRESSION: 1. Marked enlargement of the cardiopericardial silhouette, mildly increased from the prior study. Consider the possibility of a pericardial effusion. 2. No acute findings in the lungs. Electronically Signed   By: Amie Portland M.D.   On: 04/14/2015 14:12   Medications: I have reviewed the patient's current medications. Scheduled Meds: . aspirin  81 mg Oral Daily  . atorvastatin  40 mg Oral q1800  . dextromethorphan-guaiFENesin  1 tablet Oral BID  . diclofenac sodium  2 g Topical QID  . enoxaparin (LOVENOX) injection  40 mg Subcutaneous Q24H  . furosemide  40 mg Intravenous BID  . lisinopril  10 mg Oral Daily  . metoprolol succinate  50 mg Oral Daily  . multivitamin with minerals  1 tablet Oral Daily  . sodium chloride flush  3 mL Intravenous Q12H   Continuous Infusions:  PRN Meds:.sodium chloride, acetaminophen, sodium chloride flush Assessment/Plan: Systolic CHF exacerbation: Repeat echo consistent with previous studies, and patient not interested in pursuing invasive procedures for management. Unimpressive diuresis on twice daily lasix. Patient is improved and does not require any respiratory support. Probably appropriate for outpatient management if she lives with family supervision and complies with medical regimen. -Lasix 40 mg IV bid  -Strict I&Os -Continue home metoprolol 50 mg daily -Continue lisinopril 10 mg daily -Continue ASA 81 mg daily -Keep O2 sats >92% -Mucinex prn  Pleuritic Chest Pain: Pain is intermittent and occurs sometimes when coughing. Improves with topical voltaren. -Voltaren gel  Asymmetric Leg Edema: No worsening, no tenderness or inflammation, edema not significant changed compared to yesterday.  PAF: Patient previously on warfarin but stopped 2/2  vaginal bleeding. Only on ASA 81 mg at this time. Her CHADS-VASc score of 5. Patient is highly function and independent. May be a candidate for anticoagulation on a DOAC, will discuss PTA and have her follow up at PCP.  HTN: BP today 90s-100s. If seems to become symptomatic will reduce lisinopril or metoprolol -metoprolol 50 mg and lisinopril 10 mg  HLD: Patient is on atrovastain 40 mg qhs at home. -Continue home dose medications  Diet: Heart healthy DVT ppx: Lovenox FULL CODE  Dispo: Disposition is deferred at this time, awaiting improvement of current medical problems. Anticipated discharge in approximately 0-1 day(s).   The patient does have a current PCP Yolanda Manges, DO) and does need an [facility] hospital follow-up appointment after discharge.  The patient does not have transportation limitations that hinder transportation to clinic appointments.   LOS: 2 days   Fuller Plan, MD 04/16/2015, 1:13 PM

## 2015-04-17 DIAGNOSIS — I10 Essential (primary) hypertension: Secondary | ICD-10-CM

## 2015-04-17 LAB — BASIC METABOLIC PANEL
Anion gap: 9 (ref 5–15)
BUN: 24 mg/dL — ABNORMAL HIGH (ref 6–20)
CALCIUM: 8.1 mg/dL — AB (ref 8.9–10.3)
CO2: 30 mmol/L (ref 22–32)
CREATININE: 0.95 mg/dL (ref 0.44–1.00)
Chloride: 93 mmol/L — ABNORMAL LOW (ref 101–111)
GFR calc Af Amer: >60 mL/min (ref >60)
GFR calc non Af Amer: 53 mL/min — ABNORMAL LOW (ref >60)
GLUCOSE: 160 mg/dL — AB (ref 65–99)
Potassium: 4 mmol/L (ref 3.5–5.1)
Sodium: 132 mmol/L — ABNORMAL LOW (ref 135–145)

## 2015-04-17 MED ORDER — DM-GUAIFENESIN ER 30-600 MG PO TB12: 1.0000 | ORAL_TABLET | Freq: Two times a day (BID) | ORAL | Status: DC | PRN

## 2015-04-17 MED ORDER — FUROSEMIDE 40 MG PO TABS: 80.0000 mg | ORAL_TABLET | Freq: Two times a day (BID) | ORAL | Status: DC

## 2015-04-17 MED FILL — FUROSEMIDE 40 MG TABLET: 40 | 45 days supply | Qty: 180 | Fill #0

## 2015-04-17 NOTE — Care Management Important Message (Signed)
Important Message  Patient Details  Name: Jean Moore MRN: [id number] Date of Birth: [birth year]   Medicare Important Message Given:  Yes    [name] [name] 04/17/2015, 12:55 PM

## 2015-04-17 NOTE — Progress Notes (Signed)
Subjective: Breathing improved but continues to have a nonproductive cough. Less confused overnight compared to last but still slept poorly. Worked with PT yesterday who discussed SNF rehab but patient feels like going home with continuous assistance from daughter and sons.  Objective: Vital signs in last 24 hours: Filed Vitals:   04/16/15 0602 04/16/15 1301 04/17/15 0502 04/17/15 1206  BP: 101/74 94/62 104/77 98/68  Pulse: 108 87 69 93  Temp: 98.6 F (37 C) 97.6 F (36.4 C) 98.3 F (36.8 C) 99 F (37.2 C)  TempSrc: Oral Oral Oral Oral  Resp: 20 18 20 18   Height:      Weight:   72.848 kg (160 lb 9.6 oz)   SpO2: 95% 98% 95% 93%   Weight change: 0.227 kg (8 oz)  Intake/Output Summary (Last 24 hours) at 04/17/15 1335 Last data filed at 04/17/15 1217  Gross per 24 hour  Intake    360 ml  Output    600 ml  Net   -240 ml   GENERAL- alert, co-operative, NAD HEENT- Atraumatic, oral mucosa appears moist CARDIAC- RRR, systolic murmur over LUSB RESP- CTAB, faint crackles b/l, good air movement EXTREMITIES- pitting edema from ankle halfway to knee, 2+ right 1+ left SKIN- Warm, dry, No rash or lesion. PSYCH- Normal mood and affect, appropriate thought content and speech.  Lab Results: Basic Metabolic Panel:  Recent Labs Lab 04/16/15 0509 04/17/15 0420  NA 132* 132*  K 3.7 4.0  CL 92* 93*  CO2 30 30  GLUCOSE 176* 160*  BUN 23* 24*  CREATININE 0.97 0.95  CALCIUM 8.2* 8.1*   Liver Function Tests:  Recent Labs Lab 04/14/15 1327  AST 24  ALT 20  ALKPHOS 46  BILITOT 0.8  PROT 6.1*  ALBUMIN 2.3*   No results for input(s): LIPASE, AMYLASE in the last 168 hours. No results for input(s): AMMONIA in the last 168 hours. CBC:  Recent Labs Lab 04/14/15 1327  WBC 5.8  NEUTROABS 3.7  HGB 12.7  HCT 40.4  MCV 88.0  PLT 139*   Cardiac Enzymes:  Recent Labs Lab 04/14/15 2150 04/15/15 0430 04/15/15 0949  TROPONINI 0.07* 0.08* 0.06*   BNP: No results for  input(s): PROBNP in the last 168 hours. D-Dimer: No results for input(s): DDIMER in the last 168 hours. CBG: No results for input(s): GLUCAP in the last 168 hours. Hemoglobin A1C: No results for input(s): HGBA1C in the last 168 hours. Fasting Lipid Panel: No results for input(s): CHOL, HDL, LDLCALC, TRIG, CHOLHDL, LDLDIRECT in the last 168 hours. Thyroid Function Tests:  Recent Labs Lab 04/15/15 0440  TSH 0.413   Coagulation:  Recent Labs Lab 04/14/15 1756  LABPROT 15.8*  INR 1.25   Anemia Panel: No results for input(s): VITAMINB12, FOLATE, FERRITIN, TIBC, IRON, RETICCTPCT in the last 168 hours. Urine Drug Screen: Drugs of Abuse     Component Value Date/Time   LABOPIA NONE DETECTED 04/14/2015 2354   COCAINSCRNUR NONE DETECTED 04/14/2015 2354   LABBENZ NONE DETECTED 04/14/2015 2354   AMPHETMU NONE DETECTED 04/14/2015 2354   THCU NONE DETECTED 04/14/2015 2354   LABBARB NONE DETECTED 04/14/2015 2354    Alcohol Level: No results for input(s): ETH in the last 168 hours. Urinalysis:  Recent Labs Lab 04/14/15 1856  COLORURINE AMBER*  LABSPEC 1.026  PHURINE 7.5  GLUCOSEU NEGATIVE  HGBUR NEGATIVE  BILIRUBINUR SMALL*  KETONESUR NEGATIVE  PROTEINUR 100*  NITRITE NEGATIVE  LEUKOCYTESUR SMALL*    Micro Results: Recent Results (from the  past 240 hour(s))  Urine culture     Status: None   Collection Time: 04/14/15  6:55 PM  Result Value Ref Range Status   Specimen Description URINE, RANDOM  Final   Special Requests NONE  Final   Culture   Final    >=100,000 COLONIES/mL GROUP B STREP(S.AGALACTIAE)ISOLATED TESTING AGAINST S. AGALACTIAE NOT ROUTINELY PERFORMED DUE TO PREDICTABILITY OF AMP/PEN/VAN SUSCEPTIBILITY.    Report Status 04/16/2015 FINAL  Final   Studies/Results: No results found. Medications: I have reviewed the patient's current medications. Scheduled Meds: . aspirin  81 mg Oral Daily  . atorvastatin  40 mg Oral q1800  . dextromethorphan-guaiFENesin   1 tablet Oral BID  . diclofenac sodium  2 g Topical QID  . enoxaparin (LOVENOX) injection  40 mg Subcutaneous Q24H  . furosemide  40 mg Intravenous BID  . lisinopril  10 mg Oral Daily  . metoprolol succinate  50 mg Oral Daily  . multivitamin with minerals  1 tablet Oral Daily  . sodium chloride flush  3 mL Intravenous Q12H   Continuous Infusions:  PRN Meds:.sodium chloride, acetaminophen, sodium chloride flush Assessment/Plan: Systolic CHF exacerbation: Symptoms improving although continues significant pedal edema and very limited diuresis. Best plan at this time would be discharge home with close follow up at clinic. -transition to lasix  BID at discharge until clinic F/U -Strict I&Os -Continue lisinopril 10 mg daily -Continue ASA 81 mg daily -Mucinex prn  Asymmetric Leg Edema: No worsening, no tenderness or inflammation, edema not significantly changed compared to yesterday.  PAF: Patient previously on warfarin but stopped 2/2 vaginal bleeding. Only on ASA 81 mg at this time. Her CHADS-VASc score of 5. Patient is highly function and independent. May be a candidate for DOAC but due to age, advanced comorbidities, current increased fall risk, will recommend PCP renew this topic if deemed appropriate.  HTN: BP today 90s-100s. Will reduce metoprolol dose since this could worsen her weakness and function for now. -metoprolol 25 mg and lisinopril 10 mg  HLD: Patient is on atrovastatin 40 mg qhs at home. -Continue home dose medications  Diet: Heart healthy DVT ppx: Lovenox FULL CODE  Dispo: Disposition is deferred at this time, awaiting improvement of current medical problems. Anticipated discharge in approximately 0-1 day(s).   The patient does have a current PCP Yolanda Manges, DO) and does need an [facility] hospital follow-up appointment after discharge.  The patient does not have transportation limitations that hinder transportation to clinic appointments.   LOS: 3 days    Fuller Plan, MD 04/17/2015, 1:35 PM

## 2015-04-17 NOTE — Progress Notes (Signed)
  Date: 04/17/2015  Patient name: Jean Moore  Medical record number: [id number]  Date of birth: [birth year]   This patient's plan of care was discussed with the house staff. Please see their note for complete details. I concur with their findings.  Patient still with rhonchorous noises on exam still coughing but with improvement in her symptoms and improvement in her lower extremity edema and with her saturating well on room air.  She had some delirium and insomnia. A few days ago when she was sundowning. We are fairly anxious to get her back home where she will have better "moorings"  We will check back this afternoon and if doing well will dc to home with PT/OT (She did not want to go to SNF as recommended by PT)  If we dc will send home on high dose oral lasix with followup next week.  Randall Hiss, MD 04/17/2015, 3:21 PM

## 2015-04-17 NOTE — Progress Notes (Signed)
Pt refused to have her bed alarm on even after educating pt on her high fall risk status. daugther at bedside also aware that her mom doesn't want the bed alarm on. Observed pt closely.

## 2015-04-17 NOTE — Discharge Instructions (Signed)
After discharged you will need to INCREASE your LASIX (FUROSEMIDE) to 80mg  TWICE DAILY. This should continue to improve your breathing and leg swelling. Stay on this dose until your clinic appointment on 3/15 at 1:45.  I have also prescribed a cough medicine to take up to twice daily as needed.  Be sure to use assisting devices when walking around particularly in the next few days to weeks.

## 2015-04-17 NOTE — Progress Notes (Signed)
Pt has orders to be discharged. Discharge instructions given and pt has no additional questions at this time. Medication regimen reviewed and pt educated. Pt verbalized understanding and has no additional questions. Telemetry box removed. IV removed and site in good condition. Pt stable and waiting for transportation. 

## 2015-04-17 NOTE — Discharge Summary (Signed)
Name: Jean Moore MRN: [id number] DOB: Jun 29, 1926 82 y.o. PCP: Yolanda Manges, DO  Date of Admission: 04/14/2015  4:50 PM Date of Discharge: 04/17/2015 Attending Physician: Randall Hiss, MD  Discharge Diagnosis: Principal Problem:   Acute exacerbation of CHF (congestive heart failure) (HCC) Active Problems:   Essential hypertension   Paroxysmal atrial fibrillation (HCC)   Acute on chronic congestive heart failure (HCC)   Pleurisy  Discharge Medications:   Medication List    TAKE these medications        acetaminophen 500 MG tablet  Commonly known as:  TYLENOL  Take 500 mg by mouth every 6 (six) hours as needed for pain.     aspirin 81 MG tablet  Take 1 tablet (81 mg total) by mouth daily.     atorvastatin 40 MG tablet  Commonly known as:  LIPITOR  TAKE 1 TABLET BY MOUTH DAILY AT 6 PM.     CENTRUM SILVER ADULT 50+ PO  Take 1 tablet by mouth daily.     dextromethorphan-guaiFENesin 30-600 MG 12hr tablet  Commonly known as:  MUCINEX DM  Take 1 tablet by mouth 2 (two) times daily as needed for cough.     furosemide 40 MG tablet  Commonly known as:  LASIX  Take 2 tablets (80 mg total) by mouth 2 (two) times daily.     lisinopril 10 MG tablet  Commonly known as:  PRINIVIL,ZESTRIL  TAKE 1 TABLET BY MOUTH DAILY.     metoprolol succinate 50 MG 24 hr tablet  Commonly known as:  TOPROL-XL  Take 1 tablet (50 mg total) by mouth daily. Take with or immediately following a meal.     potassium chloride SA 20 MEQ tablet  Commonly known as:  K-DUR,KLOR-CON  TAKE 1 TABLET BY MOUTH DAILY        Disposition and follow-up:   [name] was discharged from [facility] in [place] [place] [employer] condition.  At the hospital follow up visit please address:  1. Acute exacerbation of severe systolic congestive heart failure: Appears to have been precipitated by medication noncompliance before admission. Discharged on twice her previous home dose of lasix (80mg  BID) to  stay with family for help taking treatments. Address her heart failure symptoms and check metabolic panel to titrate this treatment.  2. Paroxysmal atrial fibrillation: Her CHADS-VASc score is 5 and she has a relatively good function for her age and comorbidities. She might be a DOAC candidate but has previously stated no interest in coumadin anticoagulation and would need to discuss risks/benefits.  3.  Labs / imaging needed at time of follow-up: Bmet  Follow-up Appointments:     Follow-up Information    Follow up with Selina Cooley, MD On 04/22/2015.   Specialty:  Internal Medicine   Why:  @1 :45 pm   Contact information:   [address] [place] [address] [phone]       Discharge Instructions: Discharge Instructions    Face-to-face encounter (required for Medicare/Medicaid patients)    Complete by:  As directed   I Pincus Badder certify that this patient is under my care and that I, or a nurse practitioner or physician's assistant working with me, had a face-to-face encounter that meets the physician face-to-face encounter requirements with this patient on 04/17/2015. The encounter with the patient was in whole, or in part for the following medical condition(s) which is the primary reason for home health care (List medical condition): Severe systolic congestive  heart failure  The encounter with the patient was in whole, or in part, for the following medical condition, which is the primary reason for home health care:  Systolic congestive heart failure exacerbation  I certify that, based on my findings, the following services are medically necessary home health services:  Physical therapy  Reason for Medically Necessary Home Health Services:  Therapy- Instruction on use of Assistive Device for Ambulation on all Surfaces  My clinical findings support the need for the above services:  Unable to leave home safely without assistance and/or assistive device  Further, I certify that my  clinical findings support that this patient is homebound due to:  Unable to leave home safely without assistance     For home use only DME 4 wheeled rolling walker with seat    Complete by:  As directed      Home Health    Complete by:  As directed   To provide the following care/treatments:   PT OT            Consultations:    Procedures Performed:  Dg Chest 2 View  04/14/2015  CLINICAL DATA:  Cough and congestion for a few days now - r/o bronchitis or PNA - hx of AFIB, CHF, HTN EXAM: CHEST  2 VIEW COMPARISON:  04/26/2013 FINDINGS: Marked enlargement of the cardiopericardial silhouette, which appears mildly increased when compared the prior study. No mediastinal or hilar masses or convincing adenopathy. Lungs are clear.  No pleural effusion or pneumothorax. Bony thorax is demineralized. Slight wedge-shaped deformity of a mid thoracic vertebra. Slight depression of the upper endplate of what appears to be L1. These findings are stable. IMPRESSION: 1. Marked enlargement of the cardiopericardial silhouette, mildly increased from the prior study. Consider the possibility of a pericardial effusion. 2. No acute findings in the lungs. Electronically Signed   By: Amie Portland M.D.   On: 04/14/2015 14:12   2D Echo: LV EF: 20% - 25% ------------------------------------------------------------------- Indications: CHF - 428.0. ------------------------------------------------------------------- History: PMH: Atrial fibrillation. Risk factors: Hypertension. Dyslipidemia. ------------------------------------------------------------------- Study Conclusions  - Left ventricle: The cavity size was normal. There was mild  concentric hypertrophy. Systolic function was severely reduced.  The estimated ejection fraction was in the range of 10-15%.  Diffuse hypokinesis. Doppler parameters are consistent with  restrictive physiology, indicative of decreased left ventricular  diastolic  compliance and/or increased left atrial pressure.  Doppler parameters are consistent with elevated ventricular  end-diastolic filling pressure. - Ventricular septum: The contour showed diastolic flattening and  systolic flattening. - Aortic valve: Valve mobility was restricted. There was mild to  moderate stenosis. Mean gradient (S): 20 mm Hg. Peak gradient  (S): 38 mm Hg. Valve area (VTI): 0.57 cm^2. Valve area (Vmax):  0.6 cm^2. Valve area (Vmean): 0.55 cm^2. - Aortic root: The aortic root was normal in size. - Mitral valve: Calcified annulus. Moderately thickened, moderately  calcified leaflets . There was mild regurgitation. - Left atrium: The atrium was severely dilated. - Right ventricle: The cavity size was moderately dilated. Wall  thickness was normal. Systolic function was moderately reduced. - Right atrium: The atrium was moderately dilated. - Tricuspid valve: There was severe regurgitation. - Pulmonic valve: There was mild regurgitation. - Pulmonary arteries: Systolic pressure was severely increased. PA  peak pressure: 73 mm Hg (S). - Inferior vena cava: The vessel was dilated. The respirophasic  diameter changes were blunted (< 50%), consistent with elevated  central venous pressure. - Pericardium, extracardiac: A trivial pericardial effusion  was  identified posterior to the heart. Features were not consistent  with tamponade physiology.  Transthoracic echocardiography. M-mode, complete 2D, spectral Doppler, and color Doppler. Birthdate: Patient birthdate: [birth year]. Age: Patient is 82 yr old. Sex: Gender: female. BMI: 26.6 kg/m^2. Blood pressure: 118/70 Patient status: Inpatient. Study date: Study date: 04/15/2015. Study time: 10:49 AM. Location: Echo laboratory. ------------------------------------------------------------------- Left ventricle: The cavity size was normal. There was mild concentric hypertrophy. Systolic function was  severely reduced. The estimated ejection fraction was in the range of 10-15%. Diffuse hypokinesis. Doppler parameters are consistent with restrictive physiology, indicative of decreased left ventricular diastolic compliance and/or increased left atrial pressure. Doppler parameters are consistent with elevated ventricular end-diastolic filling pressure. ------------------------------------------------------------------- Aortic valve: Trileaflet; severely thickened, severely calcified leaflets. Valve mobility was restricted. Doppler: There was mild to moderate stenosis. There was no regurgitation. VTI ratio of LVOT to aortic valve: 0.18. Valve area (VTI): 0.57 cm^2. Indexed valve area (VTI): 0.31 cm^2/m^2. Peak velocity ratio of LVOT to aortic valve: 0.19. Valve area (Vmax): 0.6 cm^2. Indexed valve area (Vmax): 0.33 cm^2/m^2. Mean velocity ratio of LVOT to aortic valve: 0.17. Valve area (Vmean): 0.55 cm^2. Indexed valve area (Vmean): 0.3 cm^2/m^2. Mean gradient (S): 20 mm Hg. Peak gradient (S): 38 mm Hg. ------------------------------------------------------------------- Aorta: Aortic root: The aortic root was normal in size. ------------------------------------------------------------------- Mitral valve: Calcified annulus. Moderately thickened, moderately calcified leaflets . Mobility was not restricted. Doppler: Transvalvular velocity was within the normal range. There was no evidence for stenosis. There was mild regurgitation. ------------------------------------------------------------------- Left atrium: The atrium was severely dilated. ------------------------------------------------------------------- Right ventricle: The cavity size was moderately dilated. Wall thickness was normal. Systolic function was moderately reduced. ------------------------------------------------------------------- Ventricular septum: The contour showed diastolic flattening  and systolic flattening. ------------------------------------------------------------------- Pulmonic valve: Structurally normal valve. Cusp separation was normal. Doppler: Transvalvular velocity was within the normal range. There was no evidence for stenosis. There was mild regurgitation. ------------------------------------------------------------------- Tricuspid valve: Structurally normal valve. Doppler: Transvalvular velocity was within the normal range. There was severe regurgitation. ------------------------------------------------------------------- Pulmonary artery: The main pulmonary artery was normal-sized. Systolic pressure was severely increased. ------------------------------------------------------------------- Right atrium: The atrium was moderately dilated. ------------------------------------------------------------------- Pericardium: A trivial pericardial effusion was identified posterior to the heart. Doppler: Features were not consistent with tamponade physiology. ------------------------------------------------------------------- Systemic veins: Inferior vena cava: The vessel was dilated. The respirophasic diameter changes were blunted (< 50%), consistent with elevated central venous pressure.  Admission HPI: Ms. Bultema is an 82 year old female with a history of CHF with EF 10-15%, PAF not on anticoagulation, HTN, HLD presenting today with shortness of breath and cough. She reports that over the past 3 weeks she has had worsening dyspnea on exertion. She reports at baseline she is able to ambulate without dyspnea or difficulties. Over the past several weeks she has been unable to do chores around the house due to dyspnea. She was seen in clinic 3/3 with similar symptoms. Her weight was stable from her baseline at that time and appeared euvolemic on exam. She was mildly hypotensive at 100/70 at that time and her metoprolol was decreased from 100 mg to  50 mg daily and her lasix were continued at 40 mg bid. Since that visit she has had no improvement in her symptoms with worsening leg edema and cough. States she will not take her lasix dose at home if she is going to be out of the house as well as dietary indiscretion with salty foods. Weighs herself daily and reports her dry weight at 153 lbs that  has been slowly increasing to 158 lb today. Previous clinic visits have her weight ~163 lbs. She reports a productive cough with yellow sputum for the past 2 weeks. Denies any fevers, chills, nausea, vomiting, diarrhea or dysuria. She does report left sided pleuritic dull chest pain that is worse with coughing. No recent travel or sick contacts.  Hospital Course by problem list: Acute exacerbation of CHF (congestive heart failure): Patient did not require supplemental oxygen but continued coughing with dyspnea on any exertion. Repeat echocardiogram obtained redemonstrating her known severe sCHF with and EF 20-25%, actually slightly higher than previous study showing 10-15%. She continued a nonproductive cough throughout the admission with partial improvement. SNF rehab recommended by physical therapy evaluation but patient was not interested at all and preferred home health plan.  Pleurisy: Left sided chest pain worsened with cough that resolved within 1 day on diuresis and improving cough. Troponins trended with a maximum of 0.07 without EKG changes noted. No hypoxia was observed.  Discharge Vitals:   BP 98/68 mmHg  Pulse 93  Temp(Src) 99 F (37.2 C) (Oral)  Resp 18  Ht 5\' 5"  (1.651 m)  Wt 72.848 kg (160 lb 9.6 oz)  BMI 26.73 kg/m2  SpO2 93%  LMP 10/12/1978  Discharge Labs:  Results for orders placed or performed during the hospital encounter of 04/14/15 (from the past 24 hour(s))  Basic metabolic panel     Status: Abnormal   Collection Time: 04/17/15  4:20 AM  Result Value Ref Range   Sodium 132 (L) 135 - 145 mmol/L   Potassium 4.0 3.5 - 5.1  mmol/L   Chloride 93 (L) 101 - 111 mmol/L   CO2 30 22 - 32 mmol/L   Glucose, Bld 160 (H) 65 - 99 mg/dL   BUN 24 (H) 6 - 20 mg/dL   Creatinine, Ser 4.09 0.44 - 1.00 mg/dL   Calcium 8.1 (L) 8.9 - 10.3 mg/dL   GFR calc non Af Amer 53 (L) >60 mL/min   GFR calc Af Amer >60 >60 mL/min   Anion gap 9 5 - 15   Signed: Fuller Plan, MD 04/17/2015, 3:36 PM   Services Ordered on Discharge: Home health PT/OT

## 2015-04-17 NOTE — Progress Notes (Signed)
CSW went to speak with patient regarding plans for disposition. CSW introduced self and acknowledged the patient. Patient is alert and oriented. Patient's son Onalee Hua is at the bedside. CSW informed patient and son of PT current recommendation for short term rehab. Patient and son adamantly refused SNF placement. Son reports his brother lives in the home with the patient. Brother also reports that someone will be home with the patient at all times. Patient and son are interested in Home Health. CSW to inform CM and CSW will sign off.   Please consult if further CSW needs arise.   Fernande Boyden, LCSWA Clinical Social Worker [facility] Ph: [phone]

## 2015-04-17 NOTE — Care Management Note (Signed)
Case Management Note  Patient Details  Name: Jean Moore MRN: [id number] Date of Birth: 16-Jul-1926  Subjective/Objective:  Admitted with CHF                  Action/Plan: Patient lives at home with son, ,has private insurance with [employer] with prescription drug coverage; pharmacy of choice is [facility]. Patient does not want short term SNF placement at this time and is requesting to return home with son. HHC choices offered, patient chose [facility] for [facility] services, Corrie Dandy with [facility] called for arrangements. [place] ordered as requested and to be delivered to the room today prior to discharging home.  Expected Discharge Date:    04/17/2015              Expected Discharge Plan:  Home w Home Health Services  Discharge planning Services  CM Consult Choice offered to:  Adult Children    HH Arranged:  RN, Disease Management, PT, OT HH Agency:  [facility]  Status of Service:  In process, will continue to follow  Medicare Important Message Given:  Yes  Jean Moore [phone] 04/17/2015, 3:45 PM

## 2015-04-17 NOTE — NC FL2 (Signed)
[place] MEDICAID FL2 LEVEL OF CARE SCREENING TOOL     IDENTIFICATION  Patient Name: Jean Moore Birthdate: 22-Nov-1926 Sex: female Admission Date (Current Location): 04/14/2015  [facility] and [place] Number:  Producer, television/film/video and Address:  [facility], [address], [place], [place] [address]      Provider Number: [id number]  Attending Physician Name and Address:  Randall Hiss, MD  Relative Name and Phone Number:       Current Level of Care: Hospital Recommended Level of Care: Skilled Nursing Facility Prior Approval Number:    Date Approved/Denied: 04/17/15 PASRR Number: [id number]  Discharge Plan: SNF    Current Diagnoses: Patient Active Problem List   Diagnosis Date Noted  . Acute on chronic congestive heart failure (HCC)   . Pleurisy   . Acute exacerbation of CHF (congestive heart failure) (HCC) 04/14/2015  . Paresthesia 01/23/2015  . Multinodular goiter 08/21/2014  . Abnormal imaging of thyroid 06/11/2014  . Stenosis of right internal carotid artery 05/07/2014  . Cataract 05/15/2013  . Paroxysmal atrial fibrillation (HCC) 04/09/2012  . Chronic systolic congestive heart failure (HCC) 04/09/2012  . Routine adult health maintenance 04/09/2012  . Weight loss 01/02/2012  . Essential hypertension 03/08/2007    Orientation RESPIRATION BLADDER Height & Weight     Self, Time, Situation, Place  Normal Continent Weight: 160 lb 9.6 oz (72.848 kg) Height:  5\' 5"  (165.1 cm)  BEHAVIORAL SYMPTOMS/MOOD NEUROLOGICAL BOWEL NUTRITION STATUS      Continent Diet (2 gram sodium diet)  AMBULATORY STATUS COMMUNICATION OF NEEDS Skin   Limited Assist Verbally Normal                       Personal Care Assistance Level of Assistance  Bathing, Feeding, Dressing Bathing Assistance: Limited assistance Feeding assistance: Independent Dressing Assistance: Limited assistance     Functional Limitations Info  Sight, Hearing, Speech Sight  Info: Adequate Hearing Info: Adequate Speech Info: Adequate    SPECIAL CARE FACTORS FREQUENCY  PT (By licensed PT)                    Contractures      Additional Factors Info  Code Status, Allergies Code Status Info: FULL Allergies Info: Shrimp           Current Medications (04/17/2015):  This is the current hospital active medication list Current Facility-Administered Medications  Medication Dose Route Frequency Provider Last Rate Last Dose  . 0.9 %  sodium chloride infusion  250 mL Intravenous PRN Valentino Nose, MD      . acetaminophen (TYLENOL) tablet 650 mg  650 mg Oral Q4H PRN Valentino Nose, MD      . aspirin chewable tablet 81 mg  81 mg Oral Daily Yolanda Manges, DO   81 mg at 04/17/15 0932  . atorvastatin (LIPITOR) tablet 40 mg  40 mg Oral q1800 Yolanda Manges, DO   40 mg at 04/16/15 1652  . dextromethorphan-guaiFENesin (MUCINEX DM) 30-600 MG per 12 hr tablet 1 tablet  1 tablet Oral BID Valentino Nose, MD   1 tablet at 04/17/15 0931  . diclofenac sodium (VOLTAREN) 1 % transdermal gel 2 g  2 g Topical QID Valentino Nose, MD   2 g at 04/17/15 0932  . enoxaparin (LOVENOX) injection 40 mg  40 mg Subcutaneous Q24H Valentino Nose, MD   40 mg at 04/16/15 2130  . furosemide (LASIX) injection 40 mg  40 mg Intravenous BID Valentino Nose, MD   40 mg at 04/17/15 0757  . lisinopril (PRINIVIL,ZESTRIL) tablet 10 mg  10 mg Oral Daily Yolanda Manges, DO   10 mg at 04/17/15 0931  . metoprolol succinate (TOPROL-XL) 24 hr tablet 50 mg  50 mg Oral Daily Yolanda Manges, DO   50 mg at 04/17/15 0931  . multivitamin with minerals tablet 1 tablet  1 tablet Oral Daily Yolanda Manges, DO   1 tablet at 04/17/15 0932  . sodium chloride flush (NS) 0.9 % injection 3 mL  3 mL Intravenous Q12H Valentino Nose, MD   3 mL at 04/17/15 1000  . sodium chloride flush (NS) 0.9 % injection 3 mL  3 mL Intravenous PRN Valentino Nose, MD         Discharge Medications: Please see discharge summary for a list of  discharge medications.  Relevant Imaging Results:  Relevant Lab Results:   Additional Information SS#: [id number]  Loleta Dicker, LCSW

## 2015-04-20 MED FILL — LATANOPROST 0.005% EYE DRP: 0.005 | 30 days supply | Qty: 3 | Fill #1

## 2015-04-22 ENCOUNTER — Ambulatory Visit (INDEPENDENT_AMBULATORY_CARE_PROVIDER_SITE_OTHER): Payer: Medicare Other | Admitting: Internal Medicine

## 2015-04-22 ENCOUNTER — Encounter: Payer: Self-pay | Admitting: Internal Medicine

## 2015-04-22 VITALS — BP 76/51 | HR 102 | Temp 98.0°F | Ht 65.0 in | Wt 159.6 lb

## 2015-04-22 DIAGNOSIS — I5023 Acute on chronic systolic (congestive) heart failure: Secondary | ICD-10-CM | POA: Diagnosis not present

## 2015-04-22 LAB — BASIC METABOLIC PANEL
ANION GAP: 11 (ref 5–15)
BUN: 26 mg/dL — ABNORMAL HIGH (ref 6–20)
CO2: 33 mmol/L — ABNORMAL HIGH (ref 22–32)
Calcium: 8.7 mg/dL — ABNORMAL LOW (ref 8.9–10.3)
Chloride: 91 mmol/L — ABNORMAL LOW (ref 101–111)
Creatinine, Ser: 1.09 mg/dL — ABNORMAL HIGH (ref 0.44–1.00)
GFR calc non Af Amer: 45 mL/min — ABNORMAL LOW (ref >60)
GFR, EST AFRICAN AMERICAN: 52 mL/min — AB (ref >60)
Glucose, Bld: 171 mg/dL — ABNORMAL HIGH (ref 65–99)
POTASSIUM: 4.4 mmol/L (ref 3.5–5.1)
Sodium: 135 mmol/L (ref 135–145)

## 2015-04-22 NOTE — Patient Instructions (Signed)
Jean Moore,  It was great to see you again today.  As we discussed, it's very important you see the cardiologist to help find a way to strengthen your heart to get the fluid off. It might require going into the hospital to get IV medications that help the heart pump stronger, but I'll leave that decision to them.  Tonight, don't take any of your Lasix, but take 2 pills tomorrow morning before your appointment with Dr. Katrinka Blazing at 11am.  Take care, and come see [place] back in 2 weeks for another follow-up, Dr. Earnest Conroy

## 2015-04-22 NOTE — Assessment & Plan Note (Addendum)
[name] has known HFrEF of 20-25%; today she appears to be in dry, cold heart failure as she is clinically volume-overloaded but cool to the touch, hypotensive, and her creatinine has bumped since taking furosemide 80mg  twice daily since she was discharged 5 days ago. She and her daughter felt comfortable going home today and will see her cardiologist, Dr. Katrinka Blazing, tomorrow. I fear she will require ionotropes but I wanted to allow the cardiologists to evaluate her before re-admitting her today. I recommend she hold the furosemide tonight and take 40mg  tomorrow morning before her appointment. I appreciate Cardiology's input and will be following closely.

## 2015-04-22 NOTE — Progress Notes (Signed)
Patient ID: [name], female   DOB: October 02, 1926, 82 y.o.   MRN: [id number] Jean Moore Subjective:   Patient ID: Jean Moore female   DOB: Apr 25, 1926 82 y.o.   MRN: [id number]  HPI: Jean Moore is a 82 y.o. female with heart failure with reduced ejection fraction of 20-25%, paroxysmal atrial fibrillation not on anticoagulation due to vaginal bleeding, hypertension, and hyperlipidemia presenting to clinic for a hospital follow-up.  Hospital follow-up for systolic heart failure exacerbation: She was hospitalized from March 7 to 10 for a systolic heart failure exacerbation precipitated by not taking her furosemide regularly. She diuresed briskly to a euvolemic state and was discharged home on furosemide 80mg  twice daily. Since discharge, she has felt progressively dyspneic on exertion, lethargic, and has had intermittent loose stools. She has been taking her medications as prescribed but has not been checking her weights. She has had only intermittent loose stools, but has had well-formed bowel movements today and yesterday.   Review of Systems  Constitutional: Positive for malaise/fatigue. Negative for fever, chills and weight loss.  Respiratory: Positive for cough and shortness of breath.   Cardiovascular: Positive for orthopnea and leg swelling. Negative for chest pain, palpitations and claudication.  Musculoskeletal: Negative for falls.  Skin: Negative for rash.  Neurological: Negative for dizziness, loss of consciousness and headaches.    Objective:  Physical Exam: Filed Vitals:   04/22/15 1411 04/22/15 1453  BP: 76/51   Pulse: 102   Temp: 98 F (36.7 C)   TempSrc: Oral   Height: 5\' 5"  (1.651 m)   Weight: 159 lb 9.6 oz (72.394 kg)   SpO2: 98% 94%   General: friendly elderly lady coughing sputum into a cup Cardiac: tachycardic but regular, no rubs, murmurs or gallops, JVD halfway to mandible Pulm: breathing well, bibasilar crackles to lower  scajpula Ext: warm and well perfused, with 2+ pitting edema to knees  Assessment & Plan:  Case discussed with Dr. Rogelia Boga  Acute on chronic congestive heart failure (HCC) Jean Moore has known HFrEF of 20-25%; today she appears to be in dry, cold heart failure as she is clinically volume-overloaded but cool to the touch, hypotensive, and her creatinine has bumped since taking furosemide 80mg  twice daily since she was discharged 5 days ago. She and her daughter felt comfortable going home today and will see her cardiologist, Dr. Katrinka Blazing, tomorrow. I fear she will require ionotropes but I wanted to allow the cardiologists to evaluate her before re-admitting her today. I recommend she hold the furosemide tonight and take 40mg  tomorrow morning before her appointment. I appreciate Cardiology's input and will be following closely.   Follow Up: Return in about 2 weeks (around 05/06/2015).

## 2015-04-23 ENCOUNTER — Ambulatory Visit (INDEPENDENT_AMBULATORY_CARE_PROVIDER_SITE_OTHER): Payer: Medicare Other | Admitting: Interventional Cardiology

## 2015-04-23 ENCOUNTER — Telehealth: Payer: Self-pay | Admitting: Interventional Cardiology

## 2015-04-23 ENCOUNTER — Encounter: Payer: Self-pay | Admitting: Interventional Cardiology

## 2015-04-23 ENCOUNTER — Telehealth: Payer: Self-pay | Admitting: *Deleted

## 2015-04-23 VITALS — BP 100/62 | HR 59 | Ht 65.5 in | Wt 159.0 lb

## 2015-04-23 DIAGNOSIS — I5022 Chronic systolic (congestive) heart failure: Secondary | ICD-10-CM

## 2015-04-23 DIAGNOSIS — I482 Chronic atrial fibrillation, unspecified: Secondary | ICD-10-CM

## 2015-04-23 DIAGNOSIS — I6523 Occlusion and stenosis of bilateral carotid arteries: Secondary | ICD-10-CM

## 2015-04-23 DIAGNOSIS — I10 Essential (primary) hypertension: Secondary | ICD-10-CM | POA: Diagnosis not present

## 2015-04-23 DIAGNOSIS — Z7901 Long term (current) use of anticoagulants: Secondary | ICD-10-CM

## 2015-04-23 MED ORDER — FUROSEMIDE 40 MG PO TABS: 80.0000 mg | ORAL_TABLET | Freq: Every day | ORAL | Status: DC

## 2015-04-23 MED ORDER — DIGOXIN 125 MCG PO TABS: 0.1250 mg | ORAL_TABLET | Freq: Every day | ORAL | Status: DC

## 2015-04-23 MED FILL — DIGITEK 125 MCG TABLET: 125 | 90 days supply | Qty: 90 | Fill #0

## 2015-04-23 NOTE — Progress Notes (Signed)
Cardiology Office Note   Date:  04/23/2015   ID:  Jean Moore, DOB October 09, 1926, MRN [id number]  PCP:  Evelena Peat, DO  Cardiologist:  Lesleigh Noe, MD   Chief Complaint  Patient presents with  . Congestive Heart Failure      History of Present Illness: Jean Moore is a 82 y.o. female who presents for Nonischemic cardiomyopathy, hypotension, medication noncompliance, refusal of device therapy, and refusal of anticoagulation therapy.  Mrs. Heeb has a near end-stage/end-stage cardiomyopathy, likely related to poorly controlled blood pressure for many years prior to starting therapy. She has had some difficulty with medication compliance over time. She has refused to device therapy to help with resynchronization. She is here with her daughter today after several weeks of progressive anorexia, weight loss, low energy, and dyspnea. She has been followed by her primary care physician and was recently on the inpatient internal medicine service with decompensated heart failure after going without diuretic therapy for quite some time.  She denies chest discomfort. She sleeps in a chair. Both lower extremities are swollen.    Past Medical History  Diagnosis Date  . Atrial fibrillation (HCC)     on coumadin since 2012, recently stopped because of vaginal bleeding  . Hypertension   . Hyperlipidemia   . Routine adult health maintenance 04/09/2012  . CHF (congestive heart failure) [facility])     Past Surgical History  Procedure Laterality Date  . No past surgeries       Current Outpatient Prescriptions  Medication Sig Dispense Refill  . acetaminophen (TYLENOL) 500 MG tablet Take 500 mg by mouth every 6 (six) hours as needed for pain.    Marland Kitchen aspirin 81 MG tablet Take 1 tablet (81 mg total) by mouth daily. 120 tablet 5  . atorvastatin (LIPITOR) 40 MG tablet TAKE 1 TABLET BY MOUTH DAILY AT 6 PM. 90 tablet PRN  . dextromethorphan-guaiFENesin (MUCINEX DM) 30-600 MG 12hr tablet Take 1  tablet by mouth 2 (two) times daily as needed for cough. 60 tablet 0  . furosemide (LASIX) 40 MG tablet Take 2 tablets (80 mg total) by mouth 2 (two) times daily. 180 tablet PRN  . lisinopril (PRINIVIL,ZESTRIL) 10 MG tablet TAKE 1 TABLET BY MOUTH DAILY. 90 tablet 4  . metoprolol succinate (TOPROL-XL) 50 MG 24 hr tablet Take 1 tablet (50 mg total) by mouth daily. Take with or immediately following a meal. 90 tablet 0  . Multiple Vitamins-Minerals (CENTRUM SILVER ADULT 50+ PO) Take 1 tablet by mouth daily.    . potassium chloride SA (K-DUR,KLOR-CON) 20 MEQ tablet TAKE 1 TABLET BY MOUTH DAILY 90 tablet PRN   No current facility-administered medications for this visit.    Allergies:   Shrimp    Social History:  The patient  reports that she has never smoked. She has never used smokeless tobacco. She reports that she does not drink alcohol or use illicit drugs.   Family History:  The patient's family history includes Hypertension in her father and mother. There is no history of Thyroid disease.    ROS:  Please see the history of present illness.   Otherwise, review of systems are positive for as mentioned above. We also discussed end-of-life. She was unable to decide about hospice and coast status at this office visit..   All other systems are reviewed and negative.    PHYSICAL EXAM: VS:  BP 100/62 mmHg  Pulse 59  Ht 5' 5.5" (1.664 m)  Wt 159  lb (72.122 kg)  BMI 26.05 kg/m2  LMP 10/12/1978 , BMI Body mass index is 26.05 kg/(m^2). GEN: Well nourished, well developed, in no acute distress. Skin is cool to touch. She is frail, dyspneic, and coughing. HEENT: normal Neck: no JVD, carotid bruits, or masses Cardiac: RRR.  There is a 2/6 apical systolic murmur of mitral regurgitation. There is no diastolic murmur. There is a soft S3 gallop. gallop. There is 2+ bilateral lower extremity edema. Respiratory:  clear to auscultation bilaterally, normal work of breathing. GI: soft, nontender,  nondistended, + BS MS: no deformity or atrophy Skin: warm and dry, no rash Neuro:  Strength and sensation are intact Psych: euthymic mood, full affect   EKG:  EKG is ordered today. The ekg reveals marked atrial abnormality, sinus tachycardia,   Recent Labs: 04/14/2015: ALT 20; B Natriuretic Peptide 2772.0*; Hemoglobin 12.7; Platelets 139* 04/15/2015: TSH 0.413 04/22/2015: BUN 26*; Creatinine, Ser 1.09*; Potassium 4.4; Sodium 135    Lipid Panel    Component Value Date/Time   CHOL 248* 01/22/2008 2004   TRIG 70 01/22/2008 2004   HDL 76 01/22/2008 2004   CHOLHDL 3.3 Ratio 01/22/2008 2004   VLDL 14 01/22/2008 2004   LDLCALC 158* 01/22/2008 2004      Wt Readings from Last 3 Encounters:  04/23/15 159 lb (72.122 kg)  04/22/15 159 lb 9.6 oz (72.394 kg)  04/17/15 160 lb 9.6 oz (72.848 kg)      Other studies Reviewed: Additional studies/ records that were reviewed today include: Recent hospitalization for decompensated heart failure.. The findings include we would not involved in the patient's management. She was taking care of by the internal medicine service. I am unable to determine if end-of-life planning occurred. There was talk about inotropic therapy..    ASSESSMENT AND PLAN:  1. Chronic atrial fibrillation (HCC) Rapid ventricular response is noted.  2. Chronic systolic congestive heart failure (HCC) Currently moderately volume overloaded and with low output heart failure.   3. Hypotension, cardiogenic, aggravated by cardioactive medications for heart failure  4. End-of-life planning  5. Sinus tachycardia secondary to beta blocker withdrawal. Recently had metoprolol decreased from 100 mg daily to 50 mg daily due to hypotension.  Current medicines are reviewed at length with the patient today.  The patient has the following concerns regarding medicines: There is confusion about the current medical regimen..  The following changes/actions have been instituted:    I  encouraged hospice and palliative care .  I do not believe inotropic therapy, cardiac resuscitation, device therapy (which she is refused in the past), would be helpful.  I have encouraged her to consider DO NOT RESUSCITATE orders. They have promised a family meeting to make a decision but I urged her daughter to understand that decision should be missed pounds.  Discontinue lisinopril. Decrease furosemide to 80 mg daily.  Start digoxin .125 mg daily.  Overall prognosis is poor. I was clear with the patient and her daughter concerning this.  Labs/ tests ordered today include:  No orders of the defined types were placed in this encounter.     Disposition:   FU with HS in 1 month PRN  Signed, Lesleigh Noe, MD  04/23/2015 12:53 PM    [facility] [address] [place], [place], [place]  [address] Phone: [phone]; Fax: [phone]

## 2015-04-23 NOTE — Telephone Encounter (Signed)
Returned pt daughter call. Adv her that pt should continue aspirin 81mg  daily. Pt will have a discussion of hospice and DNR with her children this weekend. They will call on Monday 04/27/15 with a decision

## 2015-04-23 NOTE — Patient Instructions (Signed)
Medication Instructions:  Your physician has recommended you make the following change in your medication:  1) STOP Lisinopril 2) REDUCE Lasix to 80mg  each morning 3) START Digoxin 0.125mg  daily. An Rx has been sent to your pharmacy   Labwork: None ordered  Testing/Procedures: None ordered  Follow-Up: Please call our office to let us know your decision on your Code Status and Hospice care  Any Other Special Instructions Will Be Listed Below (If Applicable).     If you need a refill on your cardiac medications before your next appointment, please call your pharmacy.

## 2015-04-23 NOTE — Telephone Encounter (Signed)
New message   Pt daughter is calling to get information on the [facility] and the hospice

## 2015-04-23 NOTE — Telephone Encounter (Signed)
Clarified resident/ attending

## 2015-04-24 NOTE — Progress Notes (Signed)
Internal Medicine Clinic Attending  I saw and evaluated the patient.  I personally confirmed the key portions of the history and exam documented by Dr. Earnest Conroy and I reviewed pertinent patient test results.  The assessment, diagnosis, and plan were formulated together and I agree with the documentation in the resident's note. I spoke with the pt and her daughter. There seems to be no really new sxs - just continuation of sxs that were present before and while hospitalized. She is now having intermittent D once daily. I agreed with Dr Agustin Cree to not admit and sch appt with her cardiologist then ext day who recommended transitioning to comfort care. I am working on sch an appt with her PCP to cont to conversation as I have no doubt that that rec was jarring.

## 2015-05-04 ENCOUNTER — Telehealth: Payer: Self-pay | Admitting: Interventional Cardiology

## 2015-05-04 DIAGNOSIS — I5022 Chronic systolic (congestive) heart failure: Secondary | ICD-10-CM

## 2015-05-04 NOTE — Telephone Encounter (Signed)
New message   Pt son has questions about pt medications  Furosemide 40mg 

## 2015-05-04 NOTE — Telephone Encounter (Signed)
Spoke with patient's son who states patient is concerned that she is taking too much lasix.  At last office visit on 3/16, Dr. Katrinka Blazing advised she take 80 mg lasix daily.  Son states she does not want to take 2 of the 40 mg tablets because she has no leg swelling.  He states the patient's weight was higher after being in the hospital and weight is down now and she feels dehydrated. She has not taken any lasix today.  Patient denies SOB.  I advised patient's son that Dr. Katrinka Blazing is working in the hospital this week and that I will send him a message.  I advised she may take 40 mg today and someone from our office will call her back with Dr. Michaelle Copas advice when he replies.  He verbalized understanding and agreement.

## 2015-05-06 ENCOUNTER — Encounter: Payer: Medicare Other | Admitting: Internal Medicine

## 2015-05-07 ENCOUNTER — Other Ambulatory Visit: Payer: Self-pay | Admitting: Internal Medicine

## 2015-05-07 ENCOUNTER — Ambulatory Visit (INDEPENDENT_AMBULATORY_CARE_PROVIDER_SITE_OTHER): Payer: Medicare Other | Admitting: Internal Medicine

## 2015-05-07 VITALS — BP 97/70 | HR 85 | Temp 97.7°F | Ht 65.5 in | Wt 145.8 lb

## 2015-05-07 DIAGNOSIS — Z66 Do not resuscitate: Secondary | ICD-10-CM

## 2015-05-07 DIAGNOSIS — Z515 Encounter for palliative care: Secondary | ICD-10-CM

## 2015-05-07 DIAGNOSIS — Z7189 Other specified counseling: Secondary | ICD-10-CM

## 2015-05-07 NOTE — Progress Notes (Signed)
Subjective:    Patient ID: Jean Moore, female    DOB: 11/02/1926, 82 y.o.   MRN: [id number]  HPI Comments: Jean Moore is an 82 year old woman with PMH including chronic systolic HF, PAF not on A/C and HTN here to discuss goals of care.  Please see problem based charting for details regarding this condition.       Past Medical History  Diagnosis Date  . Atrial fibrillation (HCC)     on coumadin since 2012, recently stopped because of vaginal bleeding  . Hypertension   . Hyperlipidemia   . Routine adult health maintenance 04/09/2012  . CHF (congestive heart failure) (HCC)    Current Outpatient Prescriptions on File Prior to Visit  Medication Sig Dispense Refill  . acetaminophen (TYLENOL) 500 MG tablet Take 500 mg by mouth every 6 (six) hours as needed for pain.    Marland Kitchen aspirin 81 MG tablet Take 1 tablet (81 mg total) by mouth daily. 120 tablet 5  . atorvastatin (LIPITOR) 40 MG tablet TAKE 1 TABLET BY MOUTH DAILY AT 6 PM. 90 tablet PRN  . dextromethorphan-guaiFENesin (MUCINEX DM) 30-600 MG 12hr tablet Take 1 tablet by mouth 2 (two) times daily as needed for cough. 60 tablet 0  . digoxin (LANOXIN) 0.125 MG tablet Take 1 tablet (0.125 mg total) by mouth daily. 30 tablet 11  . furosemide (LASIX) 40 MG tablet Take 2 tablets (80 mg total) by mouth daily.    . metoprolol succinate (TOPROL-XL) 50 MG 24 hr tablet Take 1 tablet (50 mg total) by mouth daily. Take with or immediately following a meal. 90 tablet 0  . Multiple Vitamins-Minerals (CENTRUM SILVER ADULT 50+ PO) Take 1 tablet by mouth daily.    . potassium chloride SA (K-DUR,KLOR-CON) 20 MEQ tablet TAKE 1 TABLET BY MOUTH DAILY 90 tablet PRN   No current facility-administered medications on file prior to visit.    Review of Systems  Constitutional: Positive for appetite change and unexpected weight change.  Respiratory: Positive for shortness of breath. Negative for cough.   Cardiovascular: Positive for leg swelling. Negative for chest  pain and palpitations.  Genitourinary: Negative for hematuria and difficulty urinating.  Neurological: Negative for syncope and light-headedness.       Filed Vitals:   05/07/15 1556  BP: 97/70  Pulse: 85  Temp: 97.7 F (36.5 C)  TempSrc: Oral  Height: 5' 5.5" (1.664 m)  Weight: 145 lb 12.8 oz (66.134 kg)  SpO2: 96%    Objective:   Physical Exam  Constitutional: She is oriented to person, place, and time. She appears well-developed. No distress.  HENT:  Head: Normocephalic and atraumatic.  Mouth/Throat: Oropharynx is clear and moist. No oropharyngeal exudate.  Eyes: Conjunctivae and EOM are normal. Pupils are equal, round, and reactive to light. Right eye exhibits no discharge. Left eye exhibits no discharge. No scleral icterus.  Neck: Neck supple. JVD present.  Cardiovascular: Normal rate, regular rhythm and intact distal pulses.  Exam reveals no gallop and no friction rub.   Murmur heard. Pulmonary/Chest: Effort normal and breath sounds normal. No respiratory distress. She has no wheezes. She has no rales.  Abdominal: Soft. Bowel sounds are normal. She exhibits no distension. There is no tenderness. There is no rebound.  Musculoskeletal: Normal range of motion. She exhibits edema. She exhibits no tenderness.  1+ B/L LE edema  Neurological: She is alert and oriented to person, place, and time. No cranial nerve deficit.  Skin: Skin is warm. She  is not diaphoretic.  Psychiatric: She has a normal mood and affect. Her behavior is normal. Judgment and thought content normal.  Vitals reviewed.      Assessment & Plan:  Please see problem based charting for A&P.

## 2015-05-07 NOTE — Assessment & Plan Note (Addendum)
Assessment:  I met with Jean Moore to discuss current health status and goals of care in light of her recent hospitalization and follow-up with Cardiology.  She has been dealing with increasing fatigue, DOE and weight loss in recent weeks.  At recent clinic and cardiology visits, she was noted to be significantly hypotensive with cool extremities.  Conversation regarding initiation of palliative care and hospice services was initiated but she had not yet come to any decisions.  Today, Jean Moore is in good spirits. She tells me she is feeling well, reports stable DOE but no dyspnea at rest.  She experiences PND and orthopnea about 3 times per week.  She denies chest pain or palpitations.  She continue to note fatigue, decreased appetite and weight loss.  She says she simply rests when she feels tired.  She is supplementing meals with Boost.  She says she doesn't understand why she is fatigued and loosing weight.  I explained that all of her symptoms are likely due to worsening HF.  She feels well today and I explained that there will likely be some good days and some bad.  When asked about her understanding of her disease she tells me that ultimately God is in control of when she dies.  I explained that I agree with her and that we can't tell exactly when her time will be.  I explained that at this time our therapies will not improve her heart function or change disease course.  I explained it is important to always respect her wishes and continue to provide care (such as the symptom management and patient/family psychosocial support that palliative care can offer).  I explained that I (and [facility]) would continue to provide primary care.  Please see below regarding [place] discussion.  Advanced Directive:  She already has an Scientist, physiological, one of her sons is the HCPOA and her daughter, who is present at today's visit, is 2nd in-line to make health care decisions.    Hospitalization:  She wants to be admitted if  she becomes significantly symptomatic requiring admission.  Code status: I asked if she would want compressions, medications and/or intubation in the event of arrest.  It appears that she has already given this some thought and she tells me that she has decided to be DNR.  She has already discussed this with her five children and they are aware of her decision for DNR.  I explained the yellow DNR form.  She understands that if her heart or lungs can no longer function to support her life that no attempts will be made at resuscitation and that she will pass away.  She understands this and is competent to make this decision.  Her daughter is present and in agreement.  I reviewed the DNR form with her and it was signed and given to her to keep at home with her medical papers but in a place that is easily accessible for EMS or other providers.  I explained that she has the right to change her mind and notify us if she does.  She tells me that will not be the case.  We also have signed copy of DNR form here as part of her medical record.    MOST form:  I showed her the form and gave her an introduction to its contents but we did not get to fill it out at this visit (as it would have likely been overwhelming and she needed to leave the visit so her  daughter could get to work).  I suspect palliative will go over with her when they visit or we will need to do this at our next visit.  Palliative Care:   After discussion of palliative care she says she is willing to have them contact her and hear more about what they offer.   Hospice:  She does not seem to be at a point where she is ready to discuss hospice.  We will continue this discussion.    She is to follow-up with Dr. Tamala Julian prn and she will return to see me in 3 months.

## 2015-05-07 NOTE — Telephone Encounter (Signed)
The patient was to call me back with information about goals of care. Basic metabolic panel needs to be done now. I would still recommend furosemide 80 mg per day until I have more information to base a change upon.

## 2015-05-08 MED FILL — KLOR-CON M20 TABLET: 20 | 90 days supply | Qty: 90 | Fill #3

## 2015-05-08 MED FILL — METOPROLOL SUCC ER 100 MG T: 100 | 90 days supply | Qty: 90 | Fill #3

## 2015-05-08 NOTE — Telephone Encounter (Signed)
Left message for patients son to call back.

## 2015-05-08 NOTE — Progress Notes (Signed)
Internal Medicine Clinic Attending  Case discussed with Dr. Wilson at the time of the visit.  We reviewed the resident's history and exam and pertinent patient test results.  I agree with the assessment, diagnosis, and plan of care documented in the resident's note.  

## 2015-05-08 NOTE — Telephone Encounter (Signed)
Spoke with pt son, aware of dr smith's recommendations. Lab orders placed and appt made for Wednesday.

## 2015-05-13 ENCOUNTER — Other Ambulatory Visit (INDEPENDENT_AMBULATORY_CARE_PROVIDER_SITE_OTHER): Payer: Medicare Other | Admitting: *Deleted

## 2015-05-13 DIAGNOSIS — I5022 Chronic systolic (congestive) heart failure: Secondary | ICD-10-CM

## 2015-05-13 LAB — BASIC METABOLIC PANEL
BUN: 13 mg/dL (ref 7–25)
CALCIUM: 8.3 mg/dL — AB (ref 8.6–10.4)
CO2: 31 mmol/L (ref 20–31)
Chloride: 97 mmol/L — ABNORMAL LOW (ref 98–110)
Creat: 0.8 mg/dL (ref 0.60–0.88)
GLUCOSE: 194 mg/dL — AB (ref 65–99)
Potassium: 4.7 mmol/L (ref 3.5–5.3)
SODIUM: 135 mmol/L (ref 135–146)

## 2015-05-13 NOTE — Addendum Note (Signed)
Addended by: Tonita Phoenix on: 05/13/2015 02:15 PM   Modules accepted: Orders

## 2015-06-05 ENCOUNTER — Telehealth: Payer: Self-pay | Admitting: *Deleted

## 2015-06-05 NOTE — Telephone Encounter (Signed)
Call from [facility], [facility] - requesting order  for " Discontinue therapy visit week of May 8th" verbal given but stated during his visit, if his service is required, he will call back. If this is not ok, let me know.  Thanks

## 2015-06-10 MED FILL — LATANOPROST 0.005% EYE DRP: 0.005 | 25 days supply | Qty: 3 | Fill #0

## 2015-06-17 MED FILL — ATORVASTATIN 40 MG TABLET: 40 | 90 days supply | Qty: 90 | Fill #3

## 2015-07-01 MED FILL — COMBIGAN EYE DROPS: 0.2-0.5 | 25 days supply | Qty: 5 | Fill #2

## 2015-07-01 MED FILL — LATANOPROST 0.005% EYE DRP: 0.005 | 25 days supply | Qty: 3 | Fill #1

## 2015-07-16 MED FILL — DIGITEK 125 MCG TABLET: 125 | 90 days supply | Qty: 90 | Fill #1

## 2015-07-22 ENCOUNTER — Encounter: Payer: Self-pay | Admitting: *Deleted

## 2015-07-29 ENCOUNTER — Encounter: Payer: Self-pay | Admitting: Internal Medicine

## 2015-07-29 ENCOUNTER — Ambulatory Visit (INDEPENDENT_AMBULATORY_CARE_PROVIDER_SITE_OTHER): Payer: Medicare Other | Admitting: Internal Medicine

## 2015-07-29 VITALS — BP 105/71 | HR 63 | Temp 97.6°F | Ht 65.5 in | Wt 152.3 lb

## 2015-07-29 DIAGNOSIS — Z66 Do not resuscitate: Secondary | ICD-10-CM

## 2015-07-29 DIAGNOSIS — I110 Hypertensive heart disease with heart failure: Secondary | ICD-10-CM

## 2015-07-29 DIAGNOSIS — I5022 Chronic systolic (congestive) heart failure: Secondary | ICD-10-CM | POA: Diagnosis not present

## 2015-07-29 DIAGNOSIS — Z515 Encounter for palliative care: Secondary | ICD-10-CM

## 2015-07-29 DIAGNOSIS — I10 Essential (primary) hypertension: Secondary | ICD-10-CM

## 2015-07-29 NOTE — Patient Instructions (Signed)
1. I will call you if there is a problem with your labwork.  I will Palliative Care reach out to you again.  It has been a pleasure taking care of you.   2. Please take all medications as prescribed.    3. If you have worsening of your symptoms or new symptoms arise, please call the clinic ([phone]), or go to the ER immediately if symptoms are severe.   Please come back to see [place] in 3 months or sooner if you need to.

## 2015-07-29 NOTE — Progress Notes (Signed)
Subjective:    Patient ID: Jean Moore, female    DOB: [birth year], 82 y.o.   MRN: [id number]  HPI Comments: Jean Moore is an 82 year old woman with PMH as below here for follow-up of HTN.  Please see problem based charting for status of this and other chronic conditions.     Past Medical History  Diagnosis Date  . Atrial fibrillation (HCC)     on coumadin since 2012, recently stopped because of vaginal bleeding  . Hypertension   . Hyperlipidemia   . Routine adult health maintenance 04/09/2012  . CHF (congestive heart failure) (HCC)    Current Outpatient Prescriptions on File Prior to Visit  Medication Sig Dispense Refill  . acetaminophen (TYLENOL) 500 MG tablet Take 500 mg by mouth every 6 (six) hours as needed for pain.    Marland Kitchen aspirin 81 MG tablet Take 1 tablet (81 mg total) by mouth daily. 120 tablet 5  . atorvastatin (LIPITOR) 40 MG tablet TAKE 1 TABLET BY MOUTH DAILY AT 6 PM. 90 tablet PRN  . dextromethorphan-guaiFENesin (MUCINEX DM) 30-600 MG 12hr tablet Take 1 tablet by mouth 2 (two) times daily as needed for cough. 60 tablet 0  . digoxin (LANOXIN) 0.125 MG tablet Take 1 tablet (0.125 mg total) by mouth daily. 30 tablet 11  . furosemide (LASIX) 40 MG tablet Take 2 tablets (80 mg total) by mouth daily.    . metoprolol succinate (TOPROL-XL) 50 MG 24 hr tablet Take 1 tablet (50 mg total) by mouth daily. Take with or immediately following a meal. 90 tablet 0  . Multiple Vitamins-Minerals (CENTRUM SILVER ADULT 50+ PO) Take 1 tablet by mouth daily.    . potassium chloride SA (K-DUR,KLOR-CON) 20 MEQ tablet TAKE 1 TABLET BY MOUTH DAILY 90 tablet PRN   No current facility-administered medications on file prior to visit.    Review of Systems  Constitutional: Positive for appetite change. Negative for fever and chills.       Daughter says Jean Moore's appetite is better.  Respiratory: Negative for shortness of breath.   Cardiovascular: Positive for leg swelling. Negative for chest pain  and palpitations.  Gastrointestinal: Positive for diarrhea. Negative for nausea, vomiting, constipation and blood in stool.       Occasional diarrhea with certain foods.  Genitourinary: Negative for dysuria, hematuria and difficulty urinating.  Neurological: Negative for syncope and light-headedness.       Filed Vitals:   07/29/15 1438  BP: 105/71  Pulse: 63  Temp: 97.6 F (36.4 C)  TempSrc: Oral  Height: 5' 5.5" (1.664 m)  Weight: 152 lb 4.8 oz (69.083 kg)  SpO2: 97%    Objective:   Physical Exam  Constitutional: She is oriented to person, place, and time. She appears well-developed. No distress.  HENT:  Head: Normocephalic and atraumatic.  Mouth/Throat: Oropharynx is clear and moist. No oropharyngeal exudate.  Eyes: Conjunctivae and EOM are normal. Pupils are equal, round, and reactive to light. No scleral icterus.  Neck: Neck supple.  Cardiovascular: Normal rate, regular rhythm and intact distal pulses.  Exam reveals no gallop and no friction rub.   Murmur heard. Pulmonary/Chest: Effort normal and breath sounds normal. No respiratory distress. She has no wheezes. She has no rales.  Abdominal: Soft. Bowel sounds are normal. She exhibits no distension. There is no tenderness. There is no rebound and no guarding.  Musculoskeletal: Normal range of motion. She exhibits edema.  2+ B/L LE edema  Neurological: She is alert and  oriented to person, place, and time. No cranial nerve deficit.  Skin: Skin is warm and dry. She is not diaphoretic.  Psychiatric: She has a normal mood and affect. Jean behavior is normal. Judgment and thought content normal.  Vitals reviewed.         Assessment & Plan:  Please see problem based charting for A&P.

## 2015-07-30 ENCOUNTER — Telehealth: Payer: Self-pay | Admitting: Licensed Clinical Social Worker

## 2015-07-30 LAB — CMP14 + ANION GAP
ALT: 22 IU/L (ref 0–32)
ANION GAP: 11 mmol/L (ref 10.0–18.0)
AST: 23 IU/L (ref 0–40)
Albumin/Globulin Ratio: 1.3 (ref 1.2–2.2)
Albumin: 3.6 g/dL (ref 3.5–4.7)
Alkaline Phosphatase: 57 IU/L (ref 39–117)
BUN/Creatinine Ratio: 29 — ABNORMAL HIGH (ref 12–28)
BUN: 25 mg/dL (ref 8–27)
Bilirubin Total: 0.6 mg/dL (ref 0.0–1.2)
CALCIUM: 8.8 mg/dL (ref 8.7–10.3)
CO2: 31 mmol/L — AB (ref 18–29)
CREATININE: 0.86 mg/dL (ref 0.57–1.00)
Chloride: 101 mmol/L (ref 96–106)
GFR calc Af Amer: 70 mL/min/{1.73_m2} (ref >59)
GFR, EST NON AFRICAN AMERICAN: 61 mL/min/{1.73_m2} (ref >59)
GLUCOSE: 106 mg/dL — AB (ref 65–99)
Globulin, Total: 2.8 g/dL (ref 1.5–4.5)
POTASSIUM: 4.8 mmol/L (ref 3.5–5.2)
Sodium: 143 mmol/L (ref 134–144)
Total Protein: 6.4 g/dL (ref 6.0–8.5)

## 2015-07-30 NOTE — Assessment & Plan Note (Signed)
BP Readings from Last 3 Encounters:  07/29/15 105/71  05/07/15 97/70  04/23/15 100/62    Lab Results  Component Value Date   NA 143 07/29/2015   K 4.8 07/29/2015   CREATININE 0.86 07/29/2015    Assessment: Blood pressure control:  well controlled Progress toward BP goal:  at goal Comments: Compliant with Toprol XL 50mg  daily, lasix 80mg  daily.  Plan: Medications:  continue current medications:  Toprol XL 50mg  daily, lasix 80mg  daily Educational resources provided: brochure (denies) Self management tools provided: home blood pressure logbook (denies) Other plans: BMP today.  RTC in 3 months.

## 2015-07-30 NOTE — Assessment & Plan Note (Signed)
Assessment:  Jean Moore is in good spirits today as usual.  Both her and her daughter confirm that her wishes are still the same since our last conversation including the fact that she wants to be DNR.  She says she did not hear from Palliative Care and she is still willing to have them contact her. Plan:  Re-refer for palliative care services.  Continue to discuss her wishes at future visits.

## 2015-07-30 NOTE — Assessment & Plan Note (Addendum)
Assessment:  Despite EF 20-25% (March 2017) and significant symptoms only a few months ago, she reports feeling well, appetite better.  She denies dyspnea, orthopnea, decreased UOP.  She is having LE edema (better in AM after legs elevated overnight.  She reports compliance with medications and has brought the bottles today. Both her cardiologist (Dr. Katrinka Blazing) and I have discussed the poor prognosis.  I am however, glad to see that she is currently fairly asymptomatic except for leg swelling.   She has normal BP, HR , no crackles or JVD.  Palliative attempted to contact her but she says she never heard from them.  She continues to receive home care. Plan:  Continue current therapy (lasix 80mg  daily, metop XL 50mg  daily, dig 0.147mcg daily) since she is feeling well and overall prognosis likely will not change.  No S&S of dig toxicity.  Consider dig level next visit.  Continue K supp with lasix.  Check CMP today.  Elevate legs when seated.  Watch salt in foods (she has cut out sausage and eating bacon once weekly).  She is agreeable to having palliative care reach out to her again.  She will notify [facility] or Dr. Michaelle Copas office if she develops new or worsening symptoms.  To ED with severe dyspnea or chest pain.  Follow-up with cardiology. RTC in 3 months or sooner if problems.

## 2015-07-30 NOTE — Telephone Encounter (Signed)
CSW placed call to [facility] to follow up on pt's status.   Agency confirms pt is active with [facility], contact made on 05/26/15. Pt was triaged and admitted to program.  CSW placed request from pt's PCP for additional follow up with Palliative Care team.  Referral coordinator placed request.

## 2015-07-31 NOTE — Progress Notes (Signed)
Internal Medicine Clinic Attending  Case discussed with Dr. Wilson soon after the resident saw the patient.  We reviewed the resident's history and exam and pertinent patient test results.  I agree with the assessment, diagnosis, and plan of care documented in the resident's note.  

## 2015-08-04 MED FILL — LATANOPROST 0.005% EYE DRP: 0.005 | 25 days supply | Qty: 3 | Fill #2

## 2015-08-04 NOTE — Telephone Encounter (Signed)
CSW received call from [facility], Palliative Care.  Jean Moore was contacted by Palliative Care Nurse, Synetta Fail for a home visit.  Ms. Weishaupt declined visit and palliative care has signed off.

## 2015-08-27 ENCOUNTER — Other Ambulatory Visit (HOSPITAL_COMMUNITY): Payer: Self-pay | Admitting: Internal Medicine

## 2015-08-27 MED FILL — KLOR-CON M20 TABLET: 20 | 90 days supply | Qty: 90 | Fill #0

## 2015-09-02 MED FILL — LATANOPROST 0.005% EYE DRP: 0.005 | 25 days supply | Qty: 3 | Fill #3

## 2015-09-02 MED FILL — COMBIGAN EYE DROPS: 0.2-0.5 | 25 days supply | Qty: 5 | Fill #3

## 2015-09-21 ENCOUNTER — Telehealth: Payer: Self-pay

## 2015-09-21 NOTE — Telephone Encounter (Signed)
Cardiac clearance signed by Dr.Smith and faxed to [facility] along with Dr.Smith's last o/v

## 2015-10-08 MED FILL — BESIVANCE 0.6% SUSP: 0.6 | 30 days supply | Qty: 5 | Fill #0

## 2015-10-08 MED FILL — DUREZOL 0.05% EYE DROPS: 0.05 | 30 days supply | Qty: 5 | Fill #0

## 2015-10-08 MED FILL — PROLENSA 0.07% EYE DROPS: 0.07 | 60 days supply | Qty: 3 | Fill #0

## 2015-10-08 MED FILL — LATANOPROST 0.005% EYE DRP: 0.005 | 25 days supply | Qty: 3 | Fill #0

## 2015-10-15 MED FILL — DIGITEK 125 MCG TABLET: 125 | 90 days supply | Qty: 90 | Fill #2

## 2015-10-20 ENCOUNTER — Other Ambulatory Visit (HOSPITAL_COMMUNITY): Payer: Self-pay | Admitting: Internal Medicine

## 2015-10-20 MED FILL — ATORVASTATIN 40 MG TABLET: 40 | 90 days supply | Qty: 90 | Fill #0

## 2015-10-21 MED FILL — FUROSEMIDE 40 MG TABLET: 40 | 30 days supply | Qty: 120 | Fill #1

## 2015-11-09 ENCOUNTER — Other Ambulatory Visit (HOSPITAL_COMMUNITY): Payer: Self-pay | Admitting: Adult Health

## 2015-11-09 MED FILL — LATANOPROST 0.005% EYE DRP: 0.005 | 25 days supply | Qty: 3 | Fill #1

## 2015-11-09 MED FILL — METOPROLOL SUCC ER 100 MG T: 100 | 90 days supply | Qty: 90 | Fill #0

## 2015-12-08 MED FILL — KLOR-CON M20 TABLET: 20 | 90 days supply | Qty: 90 | Fill #1

## 2015-12-09 MED FILL — LATANOPROST 0.005% EYE DRP: 0.005 | 25 days supply | Qty: 3 | Fill #2

## 2015-12-10 ENCOUNTER — Encounter: Payer: Self-pay | Admitting: Internal Medicine

## 2015-12-10 ENCOUNTER — Ambulatory Visit (INDEPENDENT_AMBULATORY_CARE_PROVIDER_SITE_OTHER): Payer: Medicare Other | Admitting: Internal Medicine

## 2015-12-10 DIAGNOSIS — I5022 Chronic systolic (congestive) heart failure: Secondary | ICD-10-CM | POA: Diagnosis not present

## 2015-12-10 DIAGNOSIS — I110 Hypertensive heart disease with heart failure: Secondary | ICD-10-CM | POA: Diagnosis not present

## 2015-12-10 DIAGNOSIS — I480 Paroxysmal atrial fibrillation: Secondary | ICD-10-CM | POA: Diagnosis not present

## 2015-12-10 DIAGNOSIS — Z79899 Other long term (current) drug therapy: Secondary | ICD-10-CM

## 2015-12-10 DIAGNOSIS — I10 Essential (primary) hypertension: Secondary | ICD-10-CM

## 2015-12-10 DIAGNOSIS — R162 Hepatomegaly with splenomegaly, not elsewhere classified: Secondary | ICD-10-CM

## 2015-12-10 IMAGING — US US SOFT TISSUE HEAD/NECK
1 series · 14 of 25 positions shown · non-contrast
Comparison: None.

CLINICAL DATA: Thyroid mass

EXAM:
THYROID ULTRASOUND
TECHNIQUE: Ultrasound examination of the thyroid gland and adjacent soft
tissues was performed.

[Series 1: us soft tissue head/neck · 0.07mm/px · 14 of 50 slices shown]
[im 1/50]
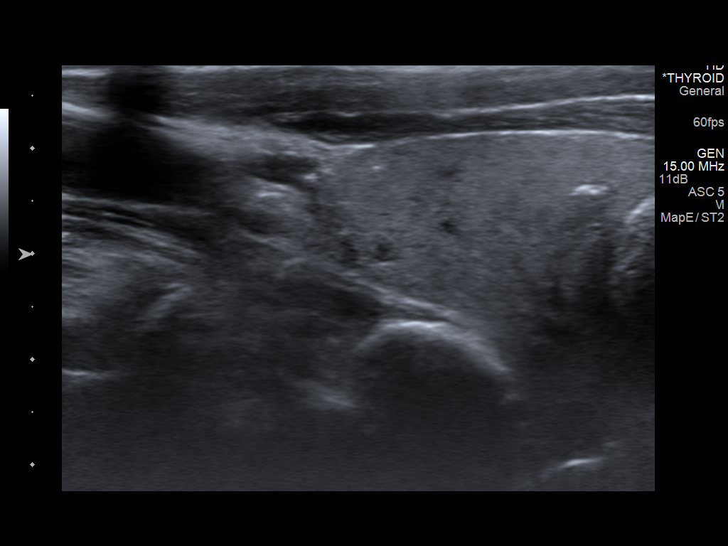
[im 5/50]
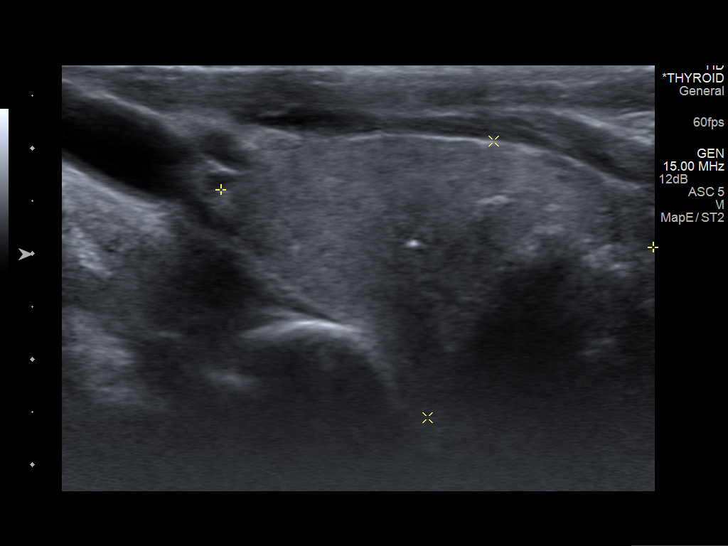
[im 9/50]
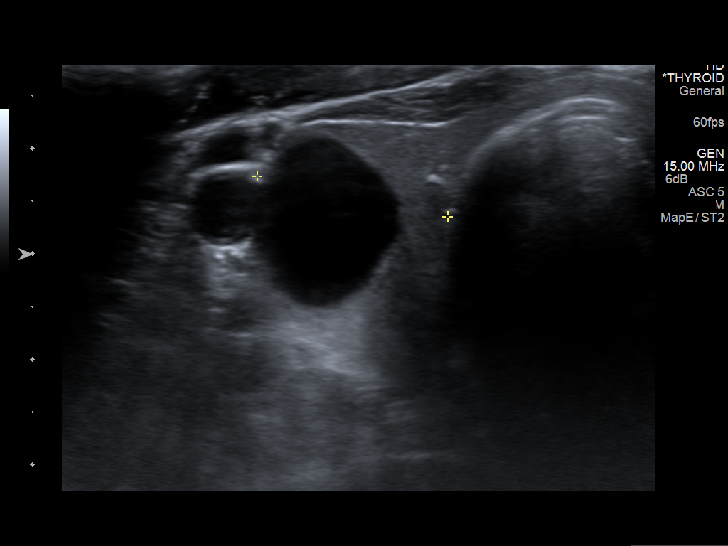
[im 13/50]
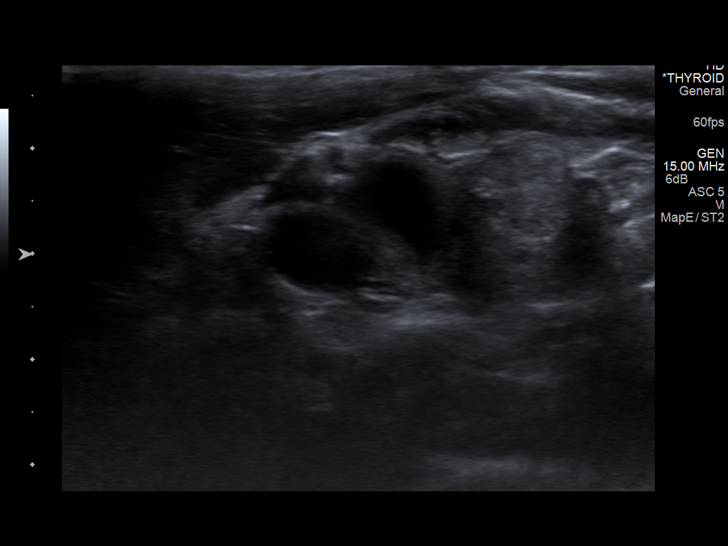
[im 17/50]
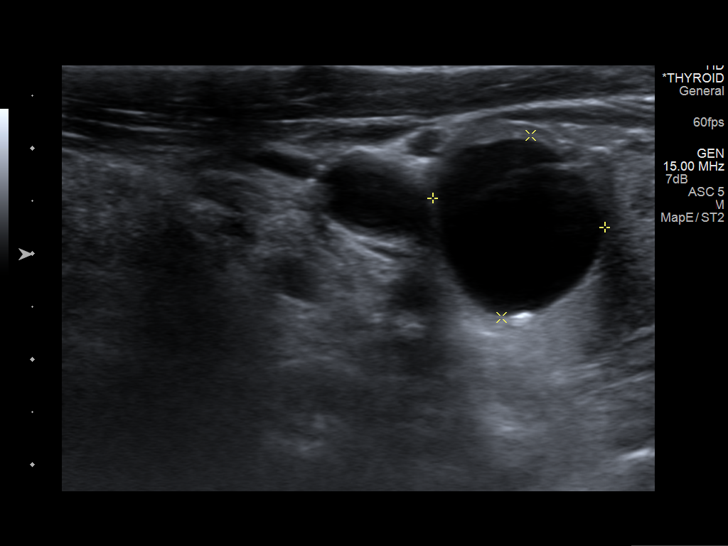
[im 19/50]
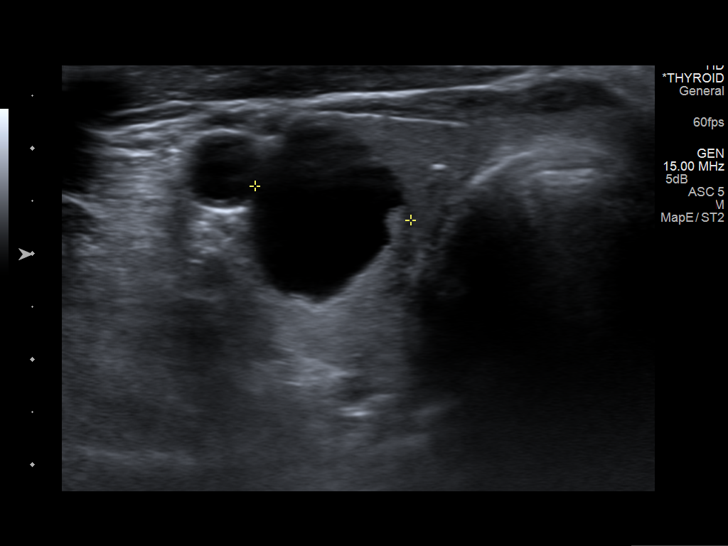
[im 23/50]
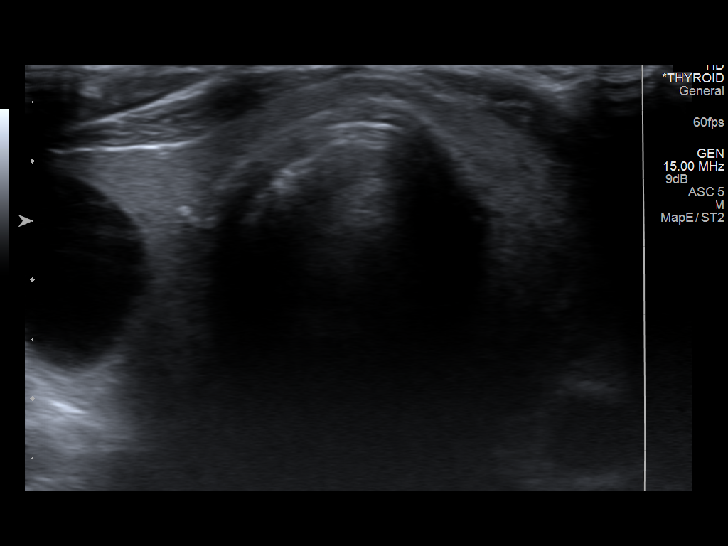
[im 27/50]
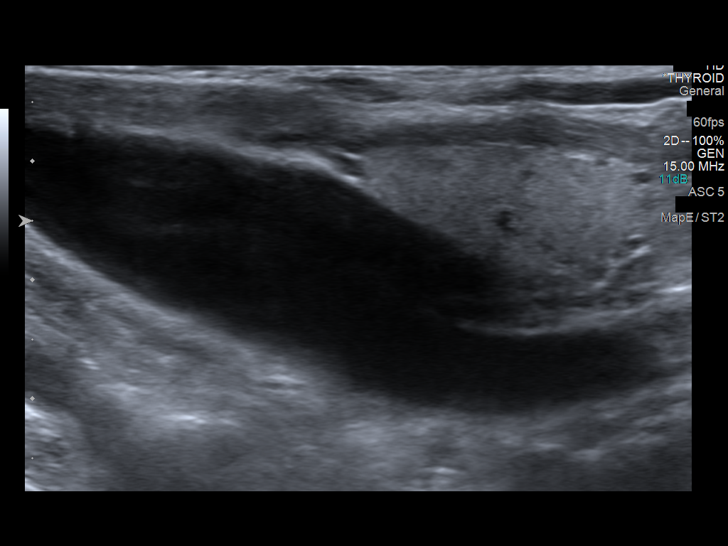
[im 31/50]
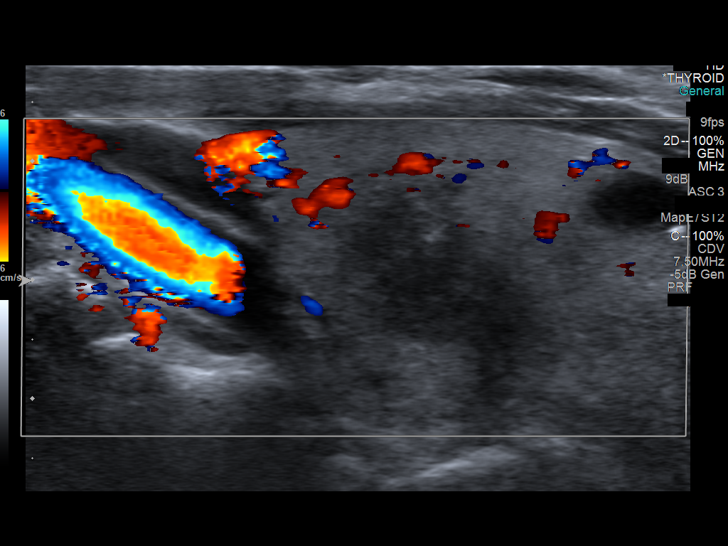
[im 33/50]
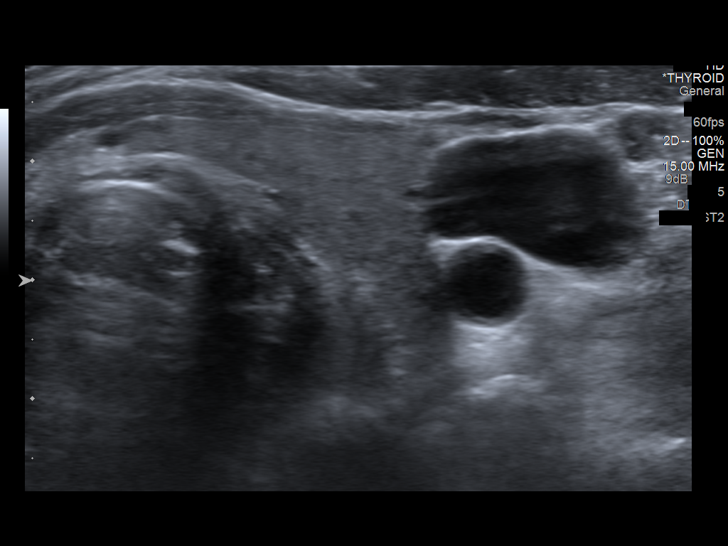
[im 37/50]
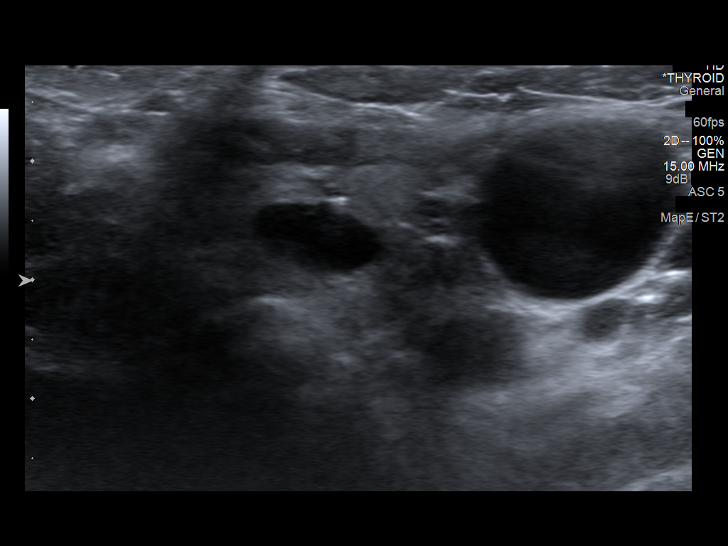
[im 41/50]
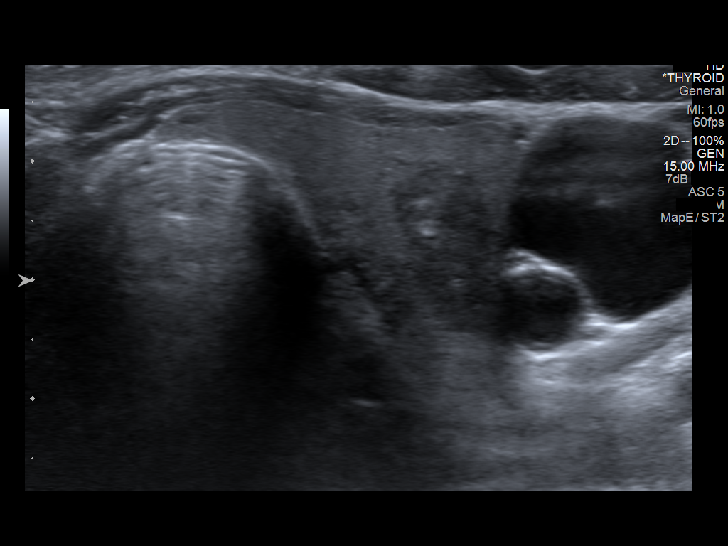
[im 45/50]
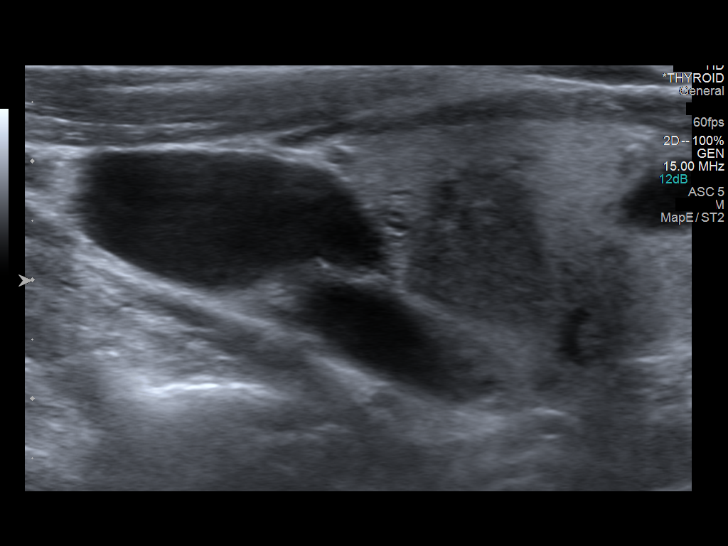
[im 50/50]
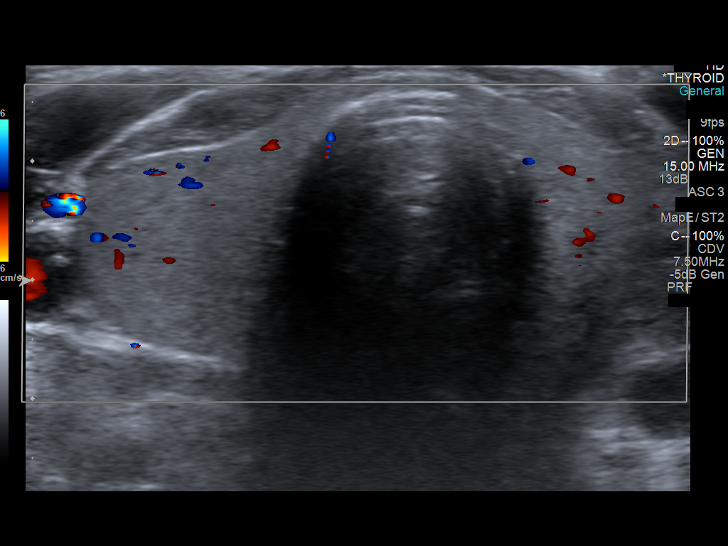

[14 of 25 positions shown; findings below may reference images not displayed]

FINDINGS: Right thyroid lobe

Measurements: 4.1 x 2.7 x 1.9 cm. 1.8 x 1.7 x 1.5 cm mid lobe cyst
with mural nodularity. Solid lower pole nodule measures 11 x 11 x 10
mm.

Left thyroid lobe

Measurements: 4.2 x 1.7 x 2.0 cm. 1.5 x 1.2 x 0.8 cm left upper pole
predominately solid nodule. 10 x 5 x 11 mm lower pole cyst has a
benign appearance.

Isthmus

Thickness: 2 mm.  No nodules visualized.

Lymphadenopathy

None visualized.
IMPRESSION: There are bilateral complex and solid nodules. Findings meet
consensus criteria for biopsy. Ultrasound-guided fine needle
aspiration should be considered, as per the consensus statement:
Management of Thyroid Nodules Detected at US: Society of
Radiologists in Ultrasound Consensus Conference Statement. Radiology

## 2015-12-10 MED ORDER — POTASSIUM CHLORIDE CRYS ER 20 MEQ PO TBCR: 20.0000 meq | EXTENDED_RELEASE_TABLET | Freq: Two times a day (BID) | ORAL | 99 refills | Status: DC

## 2015-12-10 NOTE — Assessment & Plan Note (Signed)
She is in normal sinus rhythm and exam today. She denies palpitations or chest pain.  Continue metoprolol XL 50 mg daily Continue digoxin 0.125 mg daily

## 2015-12-10 NOTE — Assessment & Plan Note (Signed)
Good blood pressure control at 123/75 today  Continue metoprolol XL 50 daily

## 2015-12-10 NOTE — Patient Instructions (Addendum)
It was a pleasure to meet you today Jean Moore!   You are doing a great job with taking your medications.  Take your lasix pill with these directions- 2 pills in the morning and 2 pills in the evening  Start taking the big pill (potassium or KCl) 1 pill in the morning and also 1 in the evening  Keep taking the rest of your medications the same way that you have been taking them.  This will help with your swelling   Please call us if your swelling does not improve or if you have any difficulty breathing that is worse than usual  Schedule an appointment to come back to see [place] in a week   Stay away from salty food- chips and canned soup

## 2015-12-10 NOTE — Progress Notes (Signed)
   CC: Leg swelling  HPI: [name] is a 82 y.o. with past medical history as outlined below who presents to clinic for follow up of her CHF and hypertension. She has had increased bilateral leg swelling which is worse as she walks throughout the day and improves when she props her legs up. She does have soreness shortness of breath on exertion on a good day she can walk from her room to the bathroom without experiencing shortness of breath. She does not think that she could walk a block without having to stop to take rest. She denies difficulty breathing at rest or when she lies down at night. She uses 2 pillows at home. She denies any cough. She reports her weight at home and says that it's usually in the 150s. She drinks about 50 ounces of water daily. She does eat potato chips but her son cooks for her and avoid using salt. She states that she has been taking Lasix 40 mg twice daily and she didn't realize she was supposed to be taking Lasix 80 twice daily. She states that she doesn't take Lasix about 2 days out of the week because the increased urination especially when she is going to be out of the house for the day. She takes her other medications as prescribed. She does not want a pneumonia or flu vaccine today  Please see problem list for status of the pt's chronic medical problems.  Past Medical History:  Diagnosis Date  . Atrial fibrillation (HCC)    on coumadin since 2012, recently stopped because of vaginal bleeding  . CHF (congestive heart failure) (HCC)   . Hyperlipidemia   . Hypertension   . Routine adult health maintenance 04/09/2012   Review of Systems:  Review of Systems  Constitutional: Negative for fever and weight loss.  HENT: Negative for ear pain.   Respiratory: Negative for cough.   Cardiovascular: Positive for leg swelling. Negative for chest pain, palpitations and orthopnea.  Gastrointestinal: Negative for abdominal pain.  Genitourinary: Positive for  frequency. Negative for dysuria.  Neurological: Negative for dizziness and headaches.  Psychiatric/Behavioral: The patient does not have insomnia.    Physical Exam:  Vitals:   12/10/15 1519  BP: 123/75  Pulse: 71  Temp: 98 F (36.7 C)  TempSrc: Oral  SpO2: 99%  Weight: 172 lb (78 kg)  Height: 5\' 6"  (1.676 m)   Physical Exam  HENT:  Head: Normocephalic and atraumatic.  Eyes: Pupils are equal, round, and reactive to light.  Neck: JVD present.  Cardiovascular: Normal rate and regular rhythm.  Exam reveals no S3.   Murmur heard. systolic murmur best heard over second right intercostal space.  Pulmonary/Chest: Effort normal. No respiratory distress. She has no wheezes.  Bilateral crackles  Abdominal: Soft. Bowel sounds are normal. She exhibits no distension and no fluid wave. There is hepatosplenomegaly. There is no tenderness.  Musculoskeletal: She exhibits edema.  2+ bilateral pitting edema to her knees    Assessment & Plan:   See Encounters Tab for problem based charting.   Patient seen with Dr. Cyndie Chime

## 2015-12-10 NOTE — Assessment & Plan Note (Addendum)
She has had increased bilateral leg swelling which is worse as she walks throughout the day and improves when she props her legs up. She does have soreness shortness of breath on exertion on a good day she can walk from her room to the bathroom without experiencing shortness of breath. She does not think that she could walk a block without having to stop to take rest. She denies difficulty breathing at rest or when she lies down at night. She uses 2 pillows at home. She denies any cough. She reports her weight at home and says that it's usually in the 150s. She drinks about 50 ounces of water daily. She does eat potato chips but her son cooks for her and avoid using salt. She states that she has been taking Lasix 40 mg twice daily but admits that she doesn't take Lasix about 2 days out of the week because when she is going to be out of the house for the day and doesn't want to have to urinate. She uses depends. She takes all of her other medications as prescribed. She is hypervolemic on exam today and has gained 20 pounds since her visit in June of this year. This volume overload is very concerning. She says that she does not want to be admitted to the hospital and will take her Lasix as prescribed at home and keep in touch with [place] if her symptoms worsen.  Stress the importance of using her Lasix 80 mg twice daily Increase her potassium supplementation to 20 mEq from once to twice daily She was instructed to continue checking her weights daily and call us if she had weight gain of greater than 2 pounds over 24 hours or 5 pounds in a week. She was scheduled for close follow-up in clinic next week we will evaluate fluid status and to BNP at that point. Instructed to go to the emergency room immediately if she develops worsening of her shortness of breath

## 2015-12-11 NOTE — Progress Notes (Signed)
Medicine attending: I personally interviewed and examined this patient on the day of the patient visit and reviewed pertinent clinical ,laboratory data  with resident physician Dr. Eulah Pont and we discussed a management plan. Sweet 82 y/o woman with chronic CHF. Current exam consistent with acute CHF with rales 1/3 up bilaterally, JVD, but no S3 gallop, AS and MR murmurs. 3+ leg edema. 20 lb weight gain over last 6 months. Dry weight 145-150, now 172. She is asymptomatic at rest and denies PND or orthopnea. We will try to intensify her outpatient diuretic regimen. If no improvement in one week, then admit to hospital for parenteral diuretics. Grandson here and will help supervise her meds. Change lasix to 80 mg BID, KCL from 20 QD to 20 mg BID. She is advised to come to the hospital if she develops dyspnea at rest. Renal function currently normal.

## 2015-12-17 ENCOUNTER — Ambulatory Visit (INDEPENDENT_AMBULATORY_CARE_PROVIDER_SITE_OTHER): Payer: Medicare Other | Admitting: Internal Medicine

## 2015-12-17 VITALS — BP 123/72 | HR 77 | Temp 97.6°F | Ht 60.0 in | Wt 158.1 lb

## 2015-12-17 DIAGNOSIS — I5023 Acute on chronic systolic (congestive) heart failure: Secondary | ICD-10-CM

## 2015-12-17 NOTE — Assessment & Plan Note (Signed)
Her symptoms have improved, but she is still volume up with bilateral leg swelling and crackles at her lung bases. Her dry weight seems to be about 150 lbs. Will have her continue her current dose of Lasix (80 mg BID) and K-Dur (20 mEq BID) and have her follow up in 5-6 days to reassess her volume status and titrate her medications as needed. Will check bmet today to make sure her potassium is not low.

## 2015-12-17 NOTE — Patient Instructions (Addendum)
General Instructions: - Continue Lasix 80 mg twice daily and Potassium 20 mEq twice daily - I will have you follow up on Monday or Tuesday of next week to recheck your weight and adjust your Lasix/potassium as needed  Please bring your medicines with you each time you come to clinic.  Medicines may include prescription medications, over-the-counter medications, herbal remedies, eye drops, vitamins, or other pills.   Progress Toward Treatment Goals:  Treatment Goal 07/29/2015  Blood pressure at goal    Self Care Goals & Plans:  Self Care Goal 07/29/2015  Manage my medications take my medicines as prescribed; bring my medications to every visit; refill my medications on time  Monitor my health keep track of my blood pressure  Eat healthy foods drink diet soda or water instead of juice or soda; eat more vegetables; eat foods that are low in salt; eat baked foods instead of fried foods; eat fruit for snacks and desserts  Be physically active find an activity I enjoy  Meeting treatment goals maintain the current self-care plan    No flowsheet data found.   Care Management & Community Referrals:  Referral 06/11/2014  Referrals made for care management support nutritionist      Low-Sodium Eating Plan Sodium raises blood pressure and causes water to be held in the body. Getting less sodium from food will help lower your blood pressure, reduce any swelling, and protect your heart, liver, and kidneys. We get sodium by adding salt (sodium chloride) to food. Most of our sodium comes from canned, boxed, and frozen foods. Restaurant foods, fast foods, and pizza are also very high in sodium. Even if you take medicine to lower your blood pressure or to reduce fluid in your body, getting less sodium from your food is important. WHAT IS MY PLAN? Most people should limit their sodium intake to 2,300 mg a day. Your health care provider recommends that you limit your sodium intake to __________ a day.   WHAT DO I NEED TO KNOW ABOUT THIS EATING PLAN? For the low-sodium eating plan, you will follow these general guidelines:  Choose foods with a % Daily Value for sodium of less than 5% (as listed on the food label).   Use salt-free seasonings or herbs instead of table salt or sea salt.   Check with your health care provider or pharmacist before using salt substitutes.   Eat fresh foods.  Eat more vegetables and fruits.  Limit canned vegetables. If you do use them, rinse them well to decrease the sodium.   Limit cheese to 1 oz (28 g) per day.   Eat lower-sodium products, often labeled as "lower sodium" or "no salt added."  Avoid foods that contain monosodium glutamate (MSG). MSG is sometimes added to [place] food and some canned foods.  Check food labels (Nutrition Facts labels) on foods to learn how much sodium is in one serving.  Eat more home-cooked food and less restaurant, buffet, and fast food.  When eating at a restaurant, ask that your food be prepared with less salt, or no salt if possible.  HOW DO I READ FOOD LABELS FOR SODIUM INFORMATION? The Nutrition Facts label lists the amount of sodium in one serving of the food. If you eat more than one serving, you must multiply the listed amount of sodium by the number of servings. Food labels may also identify foods as:  Sodium free--Less than 5 mg in a serving.  Very low sodium--35 mg or less in a serving.  Low sodium--140 mg or less in a serving.  Light in sodium--50% less sodium in a serving. For example, if a food that usually has 300 mg of sodium is changed to become light in sodium, it will have 150 mg of sodium.  Reduced sodium--25% less sodium in a serving. For example, if a food that usually has 400 mg of sodium is changed to reduced sodium, it will have 300 mg of sodium. WHAT FOODS CAN I EAT? Grains Low-sodium cereals, including oats, puffed wheat and rice, and shredded wheat cereals. Low-sodium  crackers. Unsalted rice and pasta. Lower-sodium bread.  Vegetables Frozen or fresh vegetables. Low-sodium or reduced-sodium canned vegetables. Low-sodium or reduced-sodium tomato sauce and paste. Low-sodium or reduced-sodium tomato and vegetable juices.  Fruits Fresh, frozen, and canned fruit. Fruit juice.  Meat and Other Protein Products Low-sodium canned tuna and salmon. Fresh or frozen meat, poultry, seafood, and fish. Lamb. Unsalted nuts. Dried beans, peas, and lentils without added salt. Unsalted canned beans. Homemade soups without salt. Eggs.  Dairy Milk. Soy milk. Ricotta cheese. Low-sodium or reduced-sodium cheeses. Yogurt.  Condiments Fresh and dried herbs and spices. Salt-free seasonings. Onion and garlic powders. Low-sodium varieties of mustard and ketchup. Fresh or refrigerated horseradish. Lemon juice.  Fats and Oils Reduced-sodium salad dressings. Unsalted butter.  Other Unsalted popcorn and pretzels.  The items listed above may not be a complete list of recommended foods or beverages. Contact your dietitian for more options. WHAT FOODS ARE NOT RECOMMENDED? Grains Instant hot cereals. Bread stuffing, pancake, and biscuit mixes. Croutons. Seasoned rice or pasta mixes. Noodle soup cups. Boxed or frozen macaroni and cheese. Self-rising flour. Regular salted crackers. Vegetables Regular canned vegetables. Regular canned tomato sauce and paste. Regular tomato and vegetable juices. Frozen vegetables in sauces. Salted [place] fries. Olives. Rosita Fire. Relishes. Sauerkraut. Salsa. Meat and Other Protein Products Salted, canned, smoked, spiced, or pickled meats, seafood, or fish. Bacon, ham, sausage, hot dogs, corned beef, chipped beef, and packaged luncheon meats. Salt pork. Jerky. Pickled herring. Anchovies, regular canned tuna, and sardines. Salted nuts. Dairy Processed cheese and cheese spreads. Cheese curds. Blue cheese and cottage cheese. Buttermilk.  Condiments Onion  and garlic salt, seasoned salt, table salt, and sea salt. Canned and packaged gravies. Worcestershire sauce. Tartar sauce. Barbecue sauce. Teriyaki sauce. Soy sauce, including reduced sodium. Steak sauce. Fish sauce. Oyster sauce. Cocktail sauce. Horseradish that you find on the shelf. Regular ketchup and mustard. Meat flavorings and tenderizers. Bouillon cubes. Hot sauce. Tabasco sauce. Marinades. Taco seasonings. Relishes. Fats and Oils Regular salad dressings. Salted butter. Margarine. Ghee. Bacon fat.  Other Potato and tortilla chips. Corn chips and puffs. Salted popcorn and pretzels. Canned or dried soups. Pizza. Frozen entrees and pot pies.  The items listed above may not be a complete list of foods and beverages to avoid. Contact your dietitian for more information.   This information is not intended to replace advice given to you by your health care provider. Make sure you discuss any questions you have with your health care provider.   Document Released: 07/16/2001 Document Revised: 02/14/2014 Document Reviewed: 11/28/2012 Elsevier Interactive Patient Education [employer]

## 2015-12-17 NOTE — Progress Notes (Signed)
   CC: CHF exacerbation   HPI:  [name] is a 82 y.o. woman with PMHx as noted below who presents today for follow up of her acute CHF exacerbation.  She was seen last week for increasing bilateral leg swelling and shortness of breath with ambulation found to be volume overloaded related to her CHF. Her weight was up 20 lbs last visit (152 lbs>> 172 lbs) and today her weight is 158 lbs. Her Lasix was increased to 80 mg BID and K-Dur increased to 20 mEq BID. She reports taking this regimen. She states her leg swelling is improving and she no longer feels winded when ambulating. She denies any chest pain. She notes she does weigh herself every day and monitors her fluid intake closely. She believes she drinks about 36 oz daily. She reports avoiding extra salt, but does admit to eating canned soup, smoked [place] necks, and potato chips.   Past Medical History:  Diagnosis Date  . Atrial fibrillation (HCC)    on coumadin since 2012, recently stopped because of vaginal bleeding  . CHF (congestive heart failure) (HCC)   . Hyperlipidemia   . Hypertension   . Routine adult health maintenance 04/09/2012    Review of Systems:  All negative except per HPI  Physical Exam:  Vitals:   12/17/15 1438  BP: 123/72  Pulse: 77  Temp: 97.6 F (36.4 C)  TempSrc: Oral  SpO2: 100%  Weight: 158 lb 1.6 oz (71.7 kg)  Height: 5' (1.524 m)   General: elderly woman sitting up in bed, NAD HEENT: [place]/AT, EOMI, sclera anicteric, mucus membranes moist CV: RRR, 3/6 systolic murmur heard best at RUSB Pulm: mild crackles at lung bases, breaths non-labored on room air Ext: warm, 1-2+ pitting peripheral edema bilaterally Neuro: alert and oriented x 3  Assessment & Plan:   See Encounters Tab for problem based charting.  Patient discussed with Dr. Josem Kaufmann

## 2015-12-18 LAB — BMP8+ANION GAP
Anion Gap: 14 mmol/L (ref 10.0–18.0)
BUN / CREAT RATIO: 22 (ref 12–28)
BUN: 22 mg/dL (ref 8–27)
CO2: 32 mmol/L — AB (ref 18–29)
CREATININE: 0.98 mg/dL (ref 0.57–1.00)
Calcium: 8.5 mg/dL — ABNORMAL LOW (ref 8.7–10.3)
Chloride: 96 mmol/L (ref 96–106)
GFR calc Af Amer: 60 mL/min/{1.73_m2} (ref >59)
GFR, EST NON AFRICAN AMERICAN: 52 mL/min/{1.73_m2} — AB (ref >59)
Glucose: 78 mg/dL (ref 65–99)
POTASSIUM: 4.2 mmol/L (ref 3.5–5.2)
SODIUM: 142 mmol/L (ref 134–144)

## 2015-12-19 NOTE — Progress Notes (Signed)
Case discussed with Dr. Rivet at the time of the visit.  We reviewed the resident's history and exam and pertinent patient test results.  I agree with the assessment, diagnosis, and plan of care documented in the resident's note. 

## 2015-12-22 MED FILL — FUROSEMIDE 40 MG TABLET: 40 | 30 days supply | Qty: 120 | Fill #2

## 2015-12-24 ENCOUNTER — Ambulatory Visit (INDEPENDENT_AMBULATORY_CARE_PROVIDER_SITE_OTHER): Payer: Medicare Other | Admitting: Internal Medicine

## 2015-12-24 VITALS — BP 114/89 | HR 67 | Temp 97.5°F | Ht 66.0 in | Wt 153.1 lb

## 2015-12-24 DIAGNOSIS — I5023 Acute on chronic systolic (congestive) heart failure: Secondary | ICD-10-CM | POA: Diagnosis not present

## 2015-12-24 NOTE — Assessment & Plan Note (Addendum)
Potassium has been within normal limits. She remains asymptomatic and is 153 lbs which is closer to her dry weight today but is still hypervolemic on exam with bilateral leg swelling and bilateral crackles.   Continue Lasix 80 mg BID for one week (stop 11/23) then resume lasix 80 mg once daily (start 11/24)  Continue K-chlor 20 meq BID then resume once daily 11/24 BMET today to check for hypokalemia  Will follow up in acute care clinic within the next 3 weeks when she is back in town from holiday travel  Addendum: Crt 1.08 is only a 10% rise above baseline. Will continue with the plan for lasix as described above

## 2015-12-24 NOTE — Patient Instructions (Addendum)
Physical pleasure to see you today Jean Moore   Continue taking Lasix 80 mg (two pills) twice per day until thanksgiving day (11/23)  On the day after thanksgiving (11/24) start to taking the lasix 80 mg ONLY IN THE MORNING  Continue taking the large pill (potassium) twice per day   Call the clinic if you have any difficulty breathing  Call to schedule an appointment in the next three weeks for a time when you will be in town   Low-Sodium Eating Plan Sodium raises blood pressure and causes water to be held in the body. Getting less sodium from food will help lower your blood pressure, reduce any swelling, and protect your heart, liver, and kidneys. We get sodium by adding salt (sodium chloride) to food. Most of our sodium comes from canned, boxed, and frozen foods. Restaurant foods, fast foods, and pizza are also very high in sodium. Even if you take medicine to lower your blood pressure or to reduce fluid in your body, getting less sodium from your food is important. What is my plan? Most people should limit their sodium intake to 2,300 mg a day. Your health care provider recommends that you limit your sodium intake to __________ a day. What do I need to know about this eating plan? For the low-sodium eating plan, you will follow these general guidelines:  Choose foods with a % Daily Value for sodium of less than 5% (as listed on the food label).  Use salt-free seasonings or herbs instead of table salt or sea salt.  Check with your health care provider or pharmacist before using salt substitutes.  Eat fresh foods.  Eat more vegetables and fruits.  Limit canned vegetables. If you do use them, rinse them well to decrease the sodium.  Limit cheese to 1 oz (28 g) per day.  Eat lower-sodium products, often labeled as "lower sodium" or "no salt added."  Avoid foods that contain monosodium glutamate (MSG). MSG is sometimes added to [place] food and some canned foods.  Check food labels  (Nutrition Facts labels) on foods to learn how much sodium is in one serving.  Eat more home-cooked food and less restaurant, buffet, and fast food.  When eating at a restaurant, ask that your food be prepared with less salt, or no salt if possible. How do I read food labels for sodium information? The Nutrition Facts label lists the amount of sodium in one serving of the food. If you eat more than one serving, you must multiply the listed amount of sodium by the number of servings. Food labels may also identify foods as:  Sodium free-Less than 5 mg in a serving.  Very low sodium-35 mg or less in a serving.  Low sodium-140 mg or less in a serving.  Light in sodium-50% less sodium in a serving. For example, if a food that usually has 300 mg of sodium is changed to become light in sodium, it will have 150 mg of sodium.  Reduced sodium-25% less sodium in a serving. For example, if a food that usually has 400 mg of sodium is changed to reduced sodium, it will have 300 mg of sodium. What foods can I eat? Grains  Low-sodium cereals, including oats, puffed wheat and rice, and shredded wheat cereals. Low-sodium crackers. Unsalted rice and pasta. Lower-sodium bread. Vegetables  Frozen or fresh vegetables. Low-sodium or reduced-sodium canned vegetables. Low-sodium or reduced-sodium tomato sauce and paste. Low-sodium or reduced-sodium tomato and vegetable juices. Fruits  Fresh, frozen, and  canned fruit. Fruit juice. Meat and Other Protein Products  Low-sodium canned tuna and salmon. Fresh or frozen meat, poultry, seafood, and fish. Lamb. Unsalted nuts. Dried beans, peas, and lentils without added salt. Unsalted canned beans. Homemade soups without salt. Eggs. Dairy  Milk. Soy milk. Ricotta cheese. Low-sodium or reduced-sodium cheeses. Yogurt. Condiments  Fresh and dried herbs and spices. Salt-free seasonings. Onion and garlic powders. Low-sodium varieties of mustard and ketchup. Fresh or  refrigerated horseradish. Lemon juice. Fats and Oils  Reduced-sodium salad dressings. Unsalted butter. Other  Unsalted popcorn and pretzels. The items listed above may not be a complete list of recommended foods or beverages. Contact your dietitian for more options.  What foods are not recommended? Grains  Instant hot cereals. Bread stuffing, pancake, and biscuit mixes. Croutons. Seasoned rice or pasta mixes. Noodle soup cups. Boxed or frozen macaroni and cheese. Self-rising flour. Regular salted crackers. Vegetables  Regular canned vegetables. Regular canned tomato sauce and paste. Regular tomato and vegetable juices. Frozen vegetables in sauces. Salted [place] fries. Olives. Rosita FirePickles. Relishes. Sauerkraut. Salsa. Meat and Other Protein Products  Salted, canned, smoked, spiced, or pickled meats, seafood, or fish. Bacon, ham, sausage, hot dogs, corned beef, chipped beef, and packaged luncheon meats. Salt pork. Jerky. Pickled herring. Anchovies, regular canned tuna, and sardines. Salted nuts. Dairy  Processed cheese and cheese spreads. Cheese curds. Blue cheese and cottage cheese. Buttermilk. Condiments  Onion and garlic salt, seasoned salt, table salt, and sea salt. Canned and packaged gravies. Worcestershire sauce. Tartar sauce. Barbecue sauce. Teriyaki sauce. Soy sauce, including reduced sodium. Steak sauce. Fish sauce. Oyster sauce. Cocktail sauce. Horseradish that you find on the shelf. Regular ketchup and mustard. Meat flavorings and tenderizers. Bouillon cubes. Hot sauce. Tabasco sauce. Marinades. Taco seasonings. Relishes. Fats and Oils  Regular salad dressings. Salted butter. Margarine. Ghee. Bacon fat. Other  Potato and tortilla chips. Corn chips and puffs. Salted popcorn and pretzels. Canned or dried soups. Pizza. Frozen entrees and pot pies. The items listed above may not be a complete list of foods and beverages to avoid. Contact your dietitian for more information.  This information  is not intended to replace advice given to you by your health care provider. Make sure you discuss any questions you have with your health care provider. Document Released: 07/16/2001 Document Revised: 07/02/2015 Document Reviewed: 11/28/2012 Elsevier Interactive Patient Education  2017 [employer]

## 2015-12-24 NOTE — Progress Notes (Signed)
   CC: Follow-up of acute on chronic congestive heart failure   HPI: [name] is a 82 y.o. with past medical history as outlined below who presents to clinic for follow up of acute on chronic congestive heart failure. Over the past month she has been managed for acute on chronic HFrEF exacerbation outpatient. She has been tolerating the increased Lasix dose well and denies lightheadedness. She continues to deny shortness of breath on exertion, difficulty breathing when she lays down, or cough. She has not been monitoring her weight at home but has been limiting her water intake to about 32 mL per day and has cut back her salt intake although she does have 2 slices of non sodium bacon once a week.   Please see problem list for status of the pt's chronic medical problems.  Past Medical History:  Diagnosis Date  . Atrial fibrillation (HCC)    on coumadin since 2012, recently stopped because of vaginal bleeding  . CHF (congestive heart failure) (HCC)   . Hyperlipidemia   . Hypertension   . Routine adult health maintenance 04/09/2012   Review of Systems:  Please see each problem below for a pertinent review of systems. Review of Systems  Respiratory: Negative for cough and shortness of breath.   Cardiovascular: Positive for leg swelling. Negative for chest pain, palpitations, orthopnea and PND.  Neurological: Negative for dizziness.   Physical Exam:  Vitals:   12/24/15 1430  BP: 114/89  Pulse: 67  Temp: 97.5 F (36.4 C)  TempSrc: Oral  SpO2: 99%  Weight: 153 lb 1.6 oz (69.4 kg)  Height: 5\' 6"  (1.676 m)   Physical Exam  Constitutional: No distress.  Cardiovascular: Normal rate and regular rhythm.   Murmur heard. 3/6 systolic murmur   Pulmonary/Chest: Effort normal. She has no wheezes.  Mild bibasilar crackles   Abdominal: Soft. She exhibits no distension. There is no tenderness.  Musculoskeletal: She exhibits edema.  2+ bilateral lower extremity pitting edema     Assessment & Plan:   See Encounters Tab for problem based charting.  Patient seen with Dr. Josem Kaufmann

## 2015-12-25 LAB — BMP8+ANION GAP
Anion Gap: 13 mmol/L (ref 10.0–18.0)
BUN / CREAT RATIO: 22 (ref 12–28)
BUN: 24 mg/dL (ref 8–27)
CALCIUM: 8.6 mg/dL — AB (ref 8.7–10.3)
CO2: 33 mmol/L — AB (ref 18–29)
CREATININE: 1.08 mg/dL — AB (ref 0.57–1.00)
Chloride: 98 mmol/L (ref 96–106)
GFR calc non Af Amer: 46 mL/min/{1.73_m2} — ABNORMAL LOW (ref >59)
GFR, EST AFRICAN AMERICAN: 53 mL/min/{1.73_m2} — AB (ref >59)
Glucose: 85 mg/dL (ref 65–99)
Potassium: 4.1 mmol/L (ref 3.5–5.2)
Sodium: 144 mmol/L (ref 134–144)

## 2015-12-27 NOTE — Progress Notes (Signed)
I saw and evaluated the patient.  I personally confirmed the key portions of Dr. Blum's history and exam and reviewed pertinent patient test results.  The assessment, diagnosis, and plan were formulated together and I agree with the documentation in the resident's note. 

## 2016-01-05 ENCOUNTER — Encounter: Payer: Self-pay | Admitting: Cardiology

## 2016-01-11 ENCOUNTER — Telehealth: Payer: Self-pay | Admitting: *Deleted

## 2016-01-11 NOTE — Telephone Encounter (Signed)
Hospice and pallative care will not be caring for pt anymore, pt stated to them she did not feel that she needed their services

## 2016-01-12 ENCOUNTER — Ambulatory Visit (INDEPENDENT_AMBULATORY_CARE_PROVIDER_SITE_OTHER): Payer: Medicare Other | Admitting: Cardiology

## 2016-01-12 ENCOUNTER — Encounter: Payer: Self-pay | Admitting: Cardiology

## 2016-01-12 VITALS — BP 122/64 | HR 70 | Ht 66.0 in | Wt 137.0 lb

## 2016-01-12 DIAGNOSIS — Z79899 Other long term (current) drug therapy: Secondary | ICD-10-CM | POA: Diagnosis not present

## 2016-01-12 DIAGNOSIS — I5022 Chronic systolic (congestive) heart failure: Secondary | ICD-10-CM | POA: Diagnosis not present

## 2016-01-12 DIAGNOSIS — I482 Chronic atrial fibrillation, unspecified: Secondary | ICD-10-CM

## 2016-01-12 DIAGNOSIS — I350 Nonrheumatic aortic (valve) stenosis: Secondary | ICD-10-CM | POA: Diagnosis not present

## 2016-01-12 DIAGNOSIS — I428 Other cardiomyopathies: Secondary | ICD-10-CM

## 2016-01-12 MED FILL — DIGITEK 125 MCG TABLET: 125 | 90 days supply | Qty: 90 | Fill #3

## 2016-01-12 MED FILL — LATANOPROST 0.005% EYE DRP: 0.005 | 25 days supply | Qty: 3 | Fill #3

## 2016-01-12 NOTE — Progress Notes (Signed)
Cardiology Office Note   Date:  01/12/2016   ID:  Jean Savinnnie M Greenstein, DOB 03/14/1926, MRN [id number]  PCP:  Adria DevonBryan Strewlow, MD  Cardiologist:  Dr. Katrinka BlazingSmith    Chief Complaint  Patient presents with  . Atrial Fibrillation      History of Present Illness: Jean Moore is a 82 y.o. female who presents for eval of her NICM, HTN.  She has refused device therapy and anticoagulation therapy for her permanent atrial fib.  Marland Kitchen.   Her NICM per Dr. Katrinka BlazingSmith is likely related to poorly controlled blood pressure for many years prior to starting therapy. She has had some difficulty with medication compliance over time as well.   Recent acute HF PCP increased her lasix and she responded.  Since she saw Dr. Katrinka BlazingSmith last she decided to be DNR though to continue treatment.  On last OV her BP was low , this has improved today despite increase of lasix.  Recent labs with PCP were stable.     Past Medical History:  Diagnosis Date  . Atrial fibrillation (HCC)    on coumadin since 2012, recently stopped because of vaginal bleeding  . CHF (congestive heart failure) (HCC)   . Hyperlipidemia   . Hypertension   . Routine adult health maintenance 04/09/2012    Past Surgical History:  Procedure Laterality Date  . NO PAST SURGERIES       Current Outpatient Prescriptions  Medication Sig Dispense Refill  . acetaminophen (TYLENOL) 500 MG tablet Take 500 mg by mouth every 6 (six) hours as needed for pain.    Marland Kitchen. aspirin 81 MG tablet Take 1 tablet (81 mg total) by mouth daily. 120 tablet 5  . atorvastatin (LIPITOR) 40 MG tablet TAKE 1 TABLET BY MOUTH DAILY AT 6 PM. 90 tablet PRN  . digoxin (LANOXIN) 0.125 MG tablet Take 1 tablet (0.125 mg total) by mouth daily. 30 tablet 11  . furosemide (LASIX) 40 MG tablet Take 2 tablets (80 mg total) by mouth daily. (Patient taking differently: Take 80 mg by mouth 2 (two) times daily. )    . metoprolol succinate (TOPROL-XL) 100 MG 24 hr tablet TAKE 1 TABLET BY MOUTH ONCE DAILY WITH  OR IMMEDIATELY FOLLOWING A MEAL. 90 tablet PRN  . metoprolol succinate (TOPROL-XL) 50 MG 24 hr tablet Take 1 tablet (50 mg total) by mouth daily. Take with or immediately following a meal. 90 tablet 0  . Multiple Vitamins-Minerals (CENTRUM SILVER ADULT 50+ PO) Take 1 tablet by mouth daily.    . potassium chloride SA (KLOR-CON M20) 20 MEQ tablet Take 1 tablet (20 mEq total) by mouth 2 (two) times daily. 90 tablet PRN   No current facility-administered medications for this visit.     Allergies:   Shrimp [shellfish allergy]    Social History:  The patient  reports that she has never smoked. She has never used smokeless tobacco. She reports that she does not drink alcohol or use drugs.   Family History:  The patient's family history includes Hypertension in her father and mother.    ROS:  General:no colds or fevers, + weight loss with increase of lasix Skin:no rashes or ulcers HEENT:no blurred vision, no congestion CV:see HPI PUL:see HPI GI:no diarrhea constipation or melena, no indigestion GU:no hematuria, no dysuria MS:no joint pain, no claudication Neuro:no syncope, no lightheadedness Endo:no diabetes, no thyroid disease  Wt Readings from Last 3 Encounters:  01/12/16 137 lb (62.1 kg)  12/24/15 153 lb 1.6 oz (69.4 kg)  12/17/15 158 lb 1.6 oz (71.7 kg)     PHYSICAL EXAM: VS:  BP 122/64   Pulse 70   Ht 5\' 6"  (1.676 m)   Wt 137 lb (62.1 kg)   LMP 10/12/1978   BMI 22.11 kg/m  , BMI Body mass index is 22.11 kg/m. General:Pleasant affect, NAD Skin:Warm and dry, brisk capillary refill HEENT:normocephalic, sclera clear, mucus membranes moist Neck:supple, no JVD, no bruits  Heart:S1S2 RRR with 2-3/6 systolic murmur, no gallup, rub or click Lungs:clear without rales, rhonchi, or wheezes ZOX:WRUE, non tender, + BS, do not palpate liver spleen or masses Ext:tr edema of Rt leg and none on lt.  2+ radial pulses Neuro:alert and oriented X 3, MAE, follows commands, + facial  symmetry    EKG:  EKG is NOT ordered today.   Recent Labs: 04/14/2015: B Natriuretic Peptide 2,772.0; Hemoglobin 12.7; Platelets 139 04/15/2015: TSH 0.413 07/29/2015: ALT 22 12/24/2015: BUN 24; Creatinine, Ser 1.08; Potassium 4.1; Sodium 144    Lipid Panel    Component Value Date/Time   CHOL 248 (H) 01/22/2008 2004   TRIG 70 01/22/2008 2004   HDL 76 01/22/2008 2004   CHOLHDL 3.3 Ratio 01/22/2008 2004   VLDL 14 01/22/2008 2004   LDLCALC 158 (H) 01/22/2008 2004       Other studies Reviewed: Additional studies/ records that were reviewed today include: .  Carotid dopplers Right carotid has up to 79% blockage and needs to be followed up in 6 months.  ECHO from 04/15/15 Study Conclusions  - Left ventricle: The cavity size was normal. There was mild   concentric hypertrophy. Systolic function was severely reduced.   The estimated ejection fraction was in the range of 10-15%.   Diffuse hypokinesis. Doppler parameters are consistent with   restrictive physiology, indicative of decreased left ventricular   diastolic compliance and/or increased left atrial pressure.   Doppler parameters are consistent with elevated ventricular   end-diastolic filling pressure. - Ventricular septum: The contour showed diastolic flattening and   systolic flattening. - Aortic valve: Valve mobility was restricted. There was mild to   moderate stenosis. Mean gradient (S): 20 mm Hg. Peak gradient   (S): 38 mm Hg. Valve area (VTI): 0.57 cm^2. Valve area (Vmax):   0.6 cm^2. Valve area (Vmean): 0.55 cm^2. - Aortic root: The aortic root was normal in size. - Mitral valve: Calcified annulus. Moderately thickened, moderately   calcified leaflets . There was mild regurgitation. - Left atrium: The atrium was severely dilated. - Right ventricle: The cavity size was moderately dilated. Wall   thickness was normal. Systolic function was moderately reduced. - Right atrium: The atrium was moderately dilated. -  Tricuspid valve: There was severe regurgitation. - Pulmonic valve: There was mild regurgitation. - Pulmonary arteries: Systolic pressure was severely increased. PA   peak pressure: 73 mm Hg (S). - Inferior vena cava: The vessel was dilated. The respirophasic   diameter changes were blunted (< 50%), consistent with elevated   central venous pressure. - Pericardium, extracardiac: A trivial pericardial effusion was   identified posterior to the heart. Features were not consistent   with tamponade physiology.  ASSESSMENT AND PLAN:  1. Chronic atrial fibrillation (HCC) Today rate controlled brief pauses, will check dig level next week.   2. Acute on Chronic systolic congestive heart failure (HCC) Recently diuresed and wt is down from 158 to 137.  She will see PCP back tomorrow and decrease lasix.  Trivial edema in rt leg and none in Lt.  No chest pain.  Will recheck echo in Feb. And then see Dr. Katrinka Blazing in March. I worry with episode of acute HF she may have more freq episodes, though today she looks well.   3. Hypotension, cardiogenic, aggravated by cardioactive medications for heart failure- currently BP is good.    4. End-of-life planning- per PCP and reviewed with pt today she is DNR and would want to go to hospital if significant symptoms.    5. Sinus tachycardia secondary to beta blocker withdrawal. Recently had metoprolol decreased from 100 mg daily to 50 mg daily due to hypotension. She was not sure of dose today they will call back.    6. Thyroid nodules   7. Carotid disease.  No dizziness    Current medicines are reviewed with the patient today.  The patient Has no concerns regarding medicines.  The following changes have been made:  See above Labs/ tests ordered today include:see above  Disposition:   FU:  see above  Signed, Nada Boozer, NP  01/12/2016 3:29 PM    [facility] [address] [place], [place], [place]  [address]/ [address]  [address] [place], [place] Phone: [phone]; Fax: [phone]  [phone]

## 2016-01-12 NOTE — Patient Instructions (Addendum)
Medication Instructions:  Your physician recommends that you continue on your current medications as directed. Please refer to the Current Medication list given to you today.  Labwork: Your physician recommends that you return for lab work in: 1 week for Digoxin Level.  Testing/Procedures: Your physician has requested that you have an echocardiogram in February. Echocardiography is a painless test that uses sound waves to create images of your heart. It provides your doctor with information about the size and shape of your heart and how well your heart's chambers and valves are working. This procedure takes approximately one hour. There are no restrictions for this procedure.  Follow-Up: Your physician wants you to follow-up in: March with Dr. Katrinka Blazing.   If you need a refill on your cardiac medications before your next appointment, please call your pharmacy.

## 2016-01-14 ENCOUNTER — Encounter (INDEPENDENT_AMBULATORY_CARE_PROVIDER_SITE_OTHER): Payer: Self-pay

## 2016-01-14 ENCOUNTER — Encounter: Payer: Self-pay | Admitting: Internal Medicine

## 2016-01-14 ENCOUNTER — Ambulatory Visit (INDEPENDENT_AMBULATORY_CARE_PROVIDER_SITE_OTHER): Payer: Medicare Other | Admitting: Internal Medicine

## 2016-01-14 VITALS — BP 121/62 | HR 67 | Temp 98.5°F | Ht 66.0 in | Wt 139.9 lb

## 2016-01-14 DIAGNOSIS — I480 Paroxysmal atrial fibrillation: Secondary | ICD-10-CM

## 2016-01-14 DIAGNOSIS — Z79899 Other long term (current) drug therapy: Secondary | ICD-10-CM | POA: Diagnosis not present

## 2016-01-14 DIAGNOSIS — R634 Abnormal weight loss: Secondary | ICD-10-CM

## 2016-01-14 DIAGNOSIS — I110 Hypertensive heart disease with heart failure: Secondary | ICD-10-CM

## 2016-01-14 DIAGNOSIS — I509 Heart failure, unspecified: Secondary | ICD-10-CM

## 2016-01-14 DIAGNOSIS — I10 Essential (primary) hypertension: Secondary | ICD-10-CM

## 2016-01-14 DIAGNOSIS — I5023 Acute on chronic systolic (congestive) heart failure: Secondary | ICD-10-CM

## 2016-01-14 LAB — BASIC METABOLIC PANEL
Anion gap: 6 (ref 5–15)
BUN: 19 mg/dL (ref 6–20)
CALCIUM: 8.9 mg/dL (ref 8.9–10.3)
CO2: 34 mmol/L — ABNORMAL HIGH (ref 22–32)
CREATININE: 0.98 mg/dL (ref 0.44–1.00)
Chloride: 100 mmol/L — ABNORMAL LOW (ref 101–111)
GFR calc Af Amer: 58 mL/min — ABNORMAL LOW (ref >60)
GFR, EST NON AFRICAN AMERICAN: 50 mL/min — AB (ref >60)
GLUCOSE: 94 mg/dL (ref 65–99)
POTASSIUM: 4.7 mmol/L (ref 3.5–5.1)
SODIUM: 140 mmol/L (ref 135–145)

## 2016-01-14 NOTE — Assessment & Plan Note (Signed)
Pt currently 139 lbs, has no clear established dry weight in system however appears to be 150's usually. Unsure of if this is actually the patients dry weight although she denies any peripheral edema when at that weight. Suspect patient is too dry and will be obtaining STAT BMET to evaluate for renal damage and also to help in guiding further Lasix treatment.  -Instructed patient to hold the remainder of todays Lasix and tomorrows Lasix as well. I will call her with the lab results and let her know at that time when to resume Lasix.

## 2016-01-14 NOTE — Assessment & Plan Note (Signed)
Not in Afib during examination today. Saw her cardiologist 2 days ago who made no changes to her current therapy. They will measure a digoxin level next week.  -Continue toprol and dig per cards

## 2016-01-14 NOTE — Progress Notes (Signed)
   CC: follow up of chf exacerbation.   HPI:  JeanJean Moore is a 82 y.o. F who presents for follow up of acute on chronic CHF exacerbation. Jean Moore was seen in clinic 11/2 for increased BL swelling and increased SOB and was found to be approximately 25 lbs up from dry weight, likely secondary to patient not realizing Jean Moore was only taking 40 mg BID instead of the prescribed 80 mg BID. Jean Moore Lasix at that time was re-started at the correct dosage and during f/u 1 week later was increased to 80 mg BID due to poor response. Jean Moore has diuresed well over the past few weeks and now is below Jean Moore baseline weight at 139 pounds today. Pt reports Jean Moore is having a hard time finding clothes in Jean Moore closet that fit Jean Moore. Jean Moore reports Jean Moore baseline dizziness has worsened since increasing Jean Moore lasix dosage 2 weeks ago. Denies any other symptoms. Reports SOB has improved and Jean Moore has had no leg swelling.   Past Medical History:  Diagnosis Date  . Atrial fibrillation (HCC)    on coumadin since 2012, recently stopped because of vaginal bleeding  . CHF (congestive heart failure) (HCC)   . Hyperlipidemia   . Hypertension   . Routine adult health maintenance 04/09/2012    Review of Systems:  Review of Systems  Constitutional: Positive for weight loss. Negative for chills and fever.  Respiratory: Negative for cough and shortness of breath.   Cardiovascular: Positive for palpitations. Negative for chest pain and leg swelling.  Gastrointestinal: Negative for abdominal pain, diarrhea, nausea and vomiting.  Genitourinary: Positive for frequency. Negative for dysuria and hematuria.  Neurological: Positive for dizziness. Negative for headaches.   Physical Exam: Physical Exam  Constitutional: No distress.  Very thin elderly african [place] woman.   HENT:  Head: Normocephalic and atraumatic.  Mouth/Throat: Oropharynx is clear and moist.  Cardiovascular: Normal rate, regular rhythm and normal heart sounds.   Pulmonary/Chest:  Effort normal and breath sounds normal. No respiratory distress. Jean Moore has no wheezes.  Abdominal: Soft. Bowel sounds are normal. Jean Moore exhibits no distension. There is no tenderness.  Musculoskeletal: Jean Moore exhibits no edema.  No erythema   Skin: Skin is warm and dry. Jean Moore is not diaphoretic.    Vitals:   01/14/16 1335  BP: 121/62  Pulse: 67  Temp: 98.5 F (36.9 C)  TempSrc: Oral  SpO2: 97%  Weight: 139 lb 14.4 oz (63.5 kg)  Height: 5\' 6"  (1.676 m)     Assessment & Plan:   See Encounters Tab for problem based charting.  Patient seen with Dr. Rogelia Boga

## 2016-01-14 NOTE — Assessment & Plan Note (Addendum)
Good pressures today at 121/67, pulse 62. Continue Toprol 100 mg daily

## 2016-01-14 NOTE — Patient Instructions (Addendum)
It was an absolute pleasure meeting you today! I'm glad you are feeling better!  1. Today we talked about your fluid balance. I believe you are a little "dry" now so I am obtaining STAT labs to check the status of your kidneys. I will call you with these results when the return and we will decide how to dose your Lasix based on the results.  2. PLEASE HOLD YOUR LASIX TODAY (THURSDAY) and TOMORROW (FRIDAY). You will likely resume Lasix on Saturday, but I will let you know over the phone if you can restart it.  3. Thank you for coming in today. Please take the remainder of your medications as directed and follow up with cardiology as scheduled.  4. Please return to the clinic in 1 month, or sooner if needed!

## 2016-01-20 ENCOUNTER — Other Ambulatory Visit: Payer: Medicare Other | Admitting: *Deleted

## 2016-01-20 DIAGNOSIS — I480 Paroxysmal atrial fibrillation: Secondary | ICD-10-CM

## 2016-01-20 DIAGNOSIS — I10 Essential (primary) hypertension: Secondary | ICD-10-CM

## 2016-01-20 DIAGNOSIS — I350 Nonrheumatic aortic (valve) stenosis: Secondary | ICD-10-CM

## 2016-01-20 DIAGNOSIS — I482 Chronic atrial fibrillation, unspecified: Secondary | ICD-10-CM

## 2016-01-20 NOTE — Addendum Note (Signed)
Addended by: Tonita Phoenix on: 01/20/2016 03:58 PM   Modules accepted: Orders

## 2016-01-20 NOTE — Progress Notes (Signed)
Internal Medicine Clinic Attending  I saw and evaluated the patient.  I personally confirmed the key portions of the history and exam documented by Dr. Molt and I reviewed pertinent patient test results.  The assessment, diagnosis, and plan were formulated together and I agree with the documentation in the resident's note. 

## 2016-01-26 LAB — DIGITOXIN LEVEL: Digitoxin Lvl: NOT DETECTED ng/mL

## 2016-01-26 MED FILL — KLOR-CON M20 TABLET: 20 | 45 days supply | Qty: 90 | Fill #0

## 2016-01-28 ENCOUNTER — Other Ambulatory Visit: Payer: Self-pay

## 2016-01-28 MED FILL — ATORVASTATIN 40 MG TABLET: 40 | 90 days supply | Qty: 90 | Fill #1

## 2016-01-28 NOTE — Patient Outreach (Signed)
Ms. Burchill was on the [facility] medication nonadherence list for cholesterol.  I called Ms. Helbert to determine if she was filling her atorvastatin 40 mg.  She asked if I would speak with her son since he is filling her pill box.  He stated she takes it daily and she has about 15 pills left.  I stated that she is about 10 day overdue to having it filled based on her refill history and does he remember her missing any of the pills.  He stated he does not.  I asked if they would be willing to fill it now and pick it up tomorrow.  He stated they would.  Ms. Renton spoke to me again and stated she appreciated my call.  I am happy to assist if needed in the future.    Steve Rattler, PharmD, Psychologist, occupational of [employer] [phone]

## 2016-02-18 MED FILL — FUROSEMIDE 40 MG TABLET: 40 | 30 days supply | Qty: 120 | Fill #3

## 2016-03-01 MED FILL — COMBIGAN EYE DROPS: 0.2-0.5 | 50 days supply | Qty: 5 | Fill #0

## 2016-03-01 MED FILL — LATANOPROST 0.005% EYE DRP: 0.005 | 25 days supply | Qty: 3 | Fill #0

## 2016-03-17 ENCOUNTER — Ambulatory Visit (HOSPITAL_COMMUNITY): Payer: Medicare Other | Attending: Cardiology

## 2016-03-17 DIAGNOSIS — I350 Nonrheumatic aortic (valve) stenosis: Secondary | ICD-10-CM | POA: Diagnosis not present

## 2016-03-17 DIAGNOSIS — I482 Chronic atrial fibrillation: Secondary | ICD-10-CM | POA: Diagnosis not present

## 2016-03-17 DIAGNOSIS — I313 Pericardial effusion (noninflammatory): Secondary | ICD-10-CM | POA: Diagnosis not present

## 2016-03-17 DIAGNOSIS — I083 Combined rheumatic disorders of mitral, aortic and tricuspid valves: Secondary | ICD-10-CM | POA: Insufficient documentation

## 2016-03-17 DIAGNOSIS — I10 Essential (primary) hypertension: Secondary | ICD-10-CM | POA: Insufficient documentation

## 2016-03-17 DIAGNOSIS — E785 Hyperlipidemia, unspecified: Secondary | ICD-10-CM | POA: Diagnosis not present

## 2016-03-17 MED FILL — KLOR-CON M20 TABLET: 20 | 45 days supply | Qty: 90 | Fill #1

## 2016-04-05 MED FILL — COMBIGAN EYE DROPS: 0.2-0.5 | 25 days supply | Qty: 5 | Fill #1

## 2016-04-06 MED FILL — DUREZOL 0.05% EYE DROPS: 0.05 | 20 days supply | Qty: 5 | Fill #0

## 2016-04-06 MED FILL — BESIVANCE 0.6% SUSP: 0.6 | 30 days supply | Qty: 5 | Fill #0

## 2016-04-06 MED FILL — PROLENSA 0.07% EYE DROPS: 0.07 | 30 days supply | Qty: 3 | Fill #0

## 2016-04-13 ENCOUNTER — Other Ambulatory Visit: Payer: Self-pay | Admitting: Interventional Cardiology

## 2016-04-14 ENCOUNTER — Encounter: Payer: Self-pay | Admitting: Interventional Cardiology

## 2016-04-14 ENCOUNTER — Other Ambulatory Visit: Payer: Self-pay | Admitting: *Deleted

## 2016-04-14 ENCOUNTER — Ambulatory Visit (INDEPENDENT_AMBULATORY_CARE_PROVIDER_SITE_OTHER): Payer: Medicare Other | Admitting: Interventional Cardiology

## 2016-04-14 VITALS — BP 118/78 | HR 65 | Ht 63.0 in | Wt 149.0 lb

## 2016-04-14 DIAGNOSIS — I10 Essential (primary) hypertension: Secondary | ICD-10-CM

## 2016-04-14 DIAGNOSIS — I350 Nonrheumatic aortic (valve) stenosis: Secondary | ICD-10-CM

## 2016-04-14 DIAGNOSIS — I480 Paroxysmal atrial fibrillation: Secondary | ICD-10-CM | POA: Diagnosis not present

## 2016-04-14 DIAGNOSIS — I5022 Chronic systolic (congestive) heart failure: Secondary | ICD-10-CM | POA: Diagnosis not present

## 2016-04-14 DIAGNOSIS — I6521 Occlusion and stenosis of right carotid artery: Secondary | ICD-10-CM

## 2016-04-14 MED ORDER — DIGOXIN 125 MCG PO TABS: 0.1250 mg | ORAL_TABLET | Freq: Every day | ORAL | 11 refills | Status: AC

## 2016-04-14 NOTE — Telephone Encounter (Signed)
Ok to fill digoxin as listed in chart.  Thanks!

## 2016-04-14 NOTE — Progress Notes (Signed)
Cardiology Office Note    Date:  04/14/2016   ID:  Jean Moore, DOB May 14, 1926, MRN [id number]  PCP:  Adria Devon, MD  Cardiologist: Lesleigh Noe, MD   Chief Complaint  Patient presents with  . Atrial Fibrillation  . Congestive Heart Failure    History of Present Illness:  Jean Moore is a 83 y.o. female who presents for Nonischemic cardiomyopathy, hypotension, medication noncompliance,Severe calcific aortic stenosis, refusal of device therapy, and refusal of anticoagulation therapy.  Jean Moore is brought into the office today by her son. She is doing relatively well and has lived much longer than I anticipated. Her appetite is not terrible but she does not eat much because the low salt diet does not appeal to her. She denies orthopnea. Lower extremity swelling has not been updated problem. She denies orthopnea and PND. No transient neurological complaints. She has not had syncope. She is in A wheelchair, which was used only to get her into the office today. She is still ambulatory at home. She denies chest pain.   Past Medical History:  Diagnosis Date  . Atrial fibrillation (HCC)    on coumadin since 2012, recently stopped because of vaginal bleeding  . CHF (congestive heart failure) (HCC)   . Hyperlipidemia   . Hypertension   . Routine adult health maintenance 04/09/2012    Past Surgical History:  Procedure Laterality Date  . NO PAST SURGERIES      Current Medications: Outpatient Medications Prior to Visit  Medication Sig Dispense Refill  . acetaminophen (TYLENOL) 500 MG tablet Take 500 mg by mouth every 6 (six) hours as needed for pain.    Marland Kitchen aspirin 81 MG tablet Take 1 tablet (81 mg total) by mouth daily. 120 tablet 5  . atorvastatin (LIPITOR) 40 MG tablet TAKE 1 TABLET BY MOUTH DAILY AT 6 PM. 90 tablet PRN  . furosemide (LASIX) 40 MG tablet Take 2 tablets (80 mg total) by mouth daily. (Patient taking differently: Take 80 mg by mouth 2 (two) times daily. )      . metoprolol succinate (TOPROL-XL) 100 MG 24 hr tablet TAKE 1 TABLET BY MOUTH ONCE DAILY WITH OR IMMEDIATELY FOLLOWING A MEAL. 90 tablet PRN  . metoprolol succinate (TOPROL-XL) 50 MG 24 hr tablet Take 1 tablet (50 mg total) by mouth daily. Take with or immediately following a meal. 90 tablet 0  . Multiple Vitamins-Minerals (CENTRUM SILVER ADULT 50+ PO) Take 1 tablet by mouth daily.    . potassium chloride SA (KLOR-CON M20) 20 MEQ tablet Take 1 tablet (20 mEq total) by mouth 2 (two) times daily. 90 tablet PRN  . digoxin (LANOXIN) 0.125 MG tablet Take 1 tablet (0.125 mg total) by mouth daily. 30 tablet 11   No facility-administered medications prior to visit.      Allergies:   Shrimp [shellfish allergy]   Social History   Social History  . Marital status: Widowed    Spouse name: N/A  . Number of children: N/A  . Years of education: N/A   Social History Main Topics  . Smoking status: Never Smoker  . Smokeless tobacco: Never Used  . Alcohol use No  . Drug use: No  . Sexual activity: Not Asked   Other Topics Concern  . None   Social History Narrative  . None     Family History:  The patient's family history includes Hypertension in her father and mother.   ROS:   Please see the history  of present illness.    She complains of lower extremity swelling. She has a bunion on her left great toe. The son is concerned that there is a skin tear. It has healed over and is not weeping.  All other systems reviewed and are negative.   PHYSICAL EXAM:   VS:  BP 118/78 (BP Location: Right Arm)   Pulse 65   Ht 5\' 3"  (1.6 m)   Wt 149 lb (67.6 kg)   LMP 10/12/1978   BMI 26.39 kg/m    GEN: Well nourished, well developed, in no acute distress  HEENT: normal  Neck: no JVD, carotid bruits, or masses Cardiac: iiRRR; no murmurs, rubs, or gallops,no edema  Respiratory:  clear to auscultation bilaterally, normal work of breathing GI: soft, nontender, nondistended, + BS MS: no deformity or  atrophy  Skin: warm and dry, no rash Neuro:  Alert and Oriented x 3, Strength and sensation are intact Psych: euthymic mood, full affect  Wt Readings from Last 3 Encounters:  04/14/16 149 lb (67.6 kg)  01/14/16 139 lb 14.4 oz (63.5 kg)  01/12/16 137 lb (62.1 kg)      Studies/Labs Reviewed:   EKG:  EKG  Artifact makes interpretation difficult. He is a sinus rhythm with first-degree AV block or atrial flutter with variable AV conduction. Interventricular conduction delay with superior. axis and right bundle branch block appearance. When compared to the prior tracings, the right bundle branch block and superior axis is new.  Recent Labs: 07/29/2015: ALT 22 01/14/2016: BUN 19; Creatinine, Ser 0.98; Potassium 4.7; Sodium 140   Lipid Panel    Component Value Date/Time   CHOL 248 (H) 01/22/2008 2004   TRIG 70 01/22/2008 2004   HDL 76 01/22/2008 2004   CHOLHDL 3.3 Ratio 01/22/2008 2004   VLDL 14 01/22/2008 2004   LDLCALC 158 (H) 01/22/2008 2004    Additional studies/ records that were reviewed today include:   Echocardiogram performed on 03/17/16: Study Conclusions  - Left ventricle: The cavity size was normal. Wall thickness was   increased in a pattern of mild LVH. Systolic function was   severely reduced. The estimated ejection fraction was 15%.   Diffuse hypokinesis. - Ventricular septum: The contour showed diastolic flattening and   systolic flattening. - Aortic valve: Valve mobility was restricted. There was severe   stenosis. - Mitral valve: Calcified annulus. There was mild regurgitation. - Left atrium: The atrium was severely dilated. - Right ventricle: The cavity size was moderately dilated. Systolic   function was moderately reduced. - Right atrium: The atrium was severely dilated. - Atrial septum: The septum bowed from right to left, consistent   with increased right atrial pressure. There was an atrial septal   aneurysm. - Tricuspid valve: There was wide-open  regurgitation. - Pulmonary arteries: Systolic pressure was increased. - Pericardium, extracardiac: A small pericardial effusion was   identified.  Impressions:  - Severe global reduction in LV systolic function; mild LVH;   calcified aortic valve with severe AS by continuity equation   (suggest dobutamine echocardiogram to further assess); mild MR;   severe biatrial enlargement; moderate RVE with moderately reduced   RV function; severe TR; small pericardial effusion; septal   flattening, bowing of atrial septum right to left and dilated IVC   suggestive of elevated right side pressures/pulmonary   hypertension (cannnot accurately estimate pulmonary pressure as   TR wide open).    ASSESSMENT:    1. Chronic systolic congestive heart failure (HCC)  2. Essential hypertension   3. Paroxysmal atrial fibrillation (HCC)   4. Severe aortic stenosis   5. Stenosis of right internal carotid artery      PLAN:  In order of problems listed above:  1. Dilated cardiomyopathy with class III/IV systolic heart failure with only mild volume overload. Patient is compliant with diuretic regimen. No change in therapy at this time. The patient is resistant to medication addition and adjustment. She has refused AICD consideration. 2. Blood pressure is well-controlled 3. History of atrial fibrillation/atrial flutter. She refuses anticoagulation therapy. 4. Aortic stenosis per most recent echo. I did not all for consideration of trans-valvular aortic valve replacement as she has refused active/invasive therapy in the past. After reviewing the echocardiogram I do think that the aortic stenosis is significant. Her right heart is also dilated. We will continue conservative medical management which is been her wish over the years.  Continue current medical regimen. Beta blocker therapy helps control ventricular response. We discussed Hospice and palliative care but she has refused this on more than one  occasion. She is still a full code as best I can tell. We did not discuss CODE STATUS today.  Medication Adjustments/Labs and Tests Ordered: Current medicines are reviewed at length with the patient today.  Concerns regarding medicines are outlined above.  Medication changes, Labs and Tests ordered today are listed in the Patient Instructions below. Patient Instructions  Medication Instructions:  None  Labwork: None  Testing/Procedures: None  Follow-Up: Your physician wants you to follow-up in: 9-12 months with Dr. Katrinka Blazing.  You will receive a reminder letter in the mail two months in advance. If you don't receive a letter, please call our office to schedule the follow-up appointment.   Any Other Special Instructions Will Be Listed Below (If Applicable).     If you need a refill on your cardiac medications before your next appointment, please call your pharmacy.      Signed, Lesleigh Noe, MD  04/14/2016 5:50 PM    [facility] [address] [place], [place], [place]  [address] Phone: [phone]; Fax: [phone]

## 2016-04-14 NOTE — Telephone Encounter (Signed)
Patient was seen today, just wanted to be sure that this medication remained the same. Also pharmacy is requesting DIGITEK but patients snapshot has digoxin listed. Please advise. Thanks, MI

## 2016-04-14 NOTE — Patient Instructions (Signed)
Medication Instructions:  None  Labwork: None  Testing/Procedures: None  Follow-Up: Your physician wants you to follow-up in: 9-12 months with Dr. Smith.  You will receive a reminder letter in the mail two months in advance. If you don't receive a letter, please call our office to schedule the follow-up appointment.   Any Other Special Instructions Will Be Listed Below (If Applicable).     If you need a refill on your cardiac medications before your next appointment, please call your pharmacy.   

## 2016-04-15 MED FILL — DIGITEK 125 MCG TABLET: 125 | 30 days supply | Qty: 30 | Fill #0

## 2016-04-25 ENCOUNTER — Other Ambulatory Visit: Payer: Self-pay | Admitting: Internal Medicine

## 2016-04-25 MED FILL — FUROSEMIDE 40 MG TABLET: 40 | 45 days supply | Qty: 180 | Fill #0

## 2016-04-27 MED FILL — LATANOPROST 0.005% EYE DRP: 0.005 | 25 days supply | Qty: 3 | Fill #1

## 2016-04-27 MED FILL — COMBIGAN EYE DROPS: 0.2-0.5 | 25 days supply | Qty: 5 | Fill #2

## 2016-04-27 MED FILL — KLOR-CON M20 TABLET: 20 | 45 days supply | Qty: 90 | Fill #2

## 2016-04-29 MED FILL — PROLENSA 0.07% EYE DROPS: 0.07 | 30 days supply | Qty: 3 | Fill #1

## 2016-04-29 MED FILL — BESIVANCE 0.6% SUSP: 0.6 | 30 days supply | Qty: 5 | Fill #1

## 2016-04-29 MED FILL — DUREZOL 0.05% EYE DROPS: 0.05 | 20 days supply | Qty: 5 | Fill #1

## 2016-05-02 ENCOUNTER — Telehealth: Payer: Self-pay | Admitting: *Deleted

## 2016-05-02 MED FILL — METOPROLOL SUCC ER 100 MG T: 100 | 90 days supply | Qty: 90 | Fill #1

## 2016-05-02 MED FILL — ATORVASTATIN 40 MG TABLET: 40 | 90 days supply | Qty: 90 | Fill #2

## 2016-05-02 NOTE — Telephone Encounter (Signed)
Pt  states she has not injured her toe but her L great toe is coming off and is painful, desires an appt, 3/29 at 1345

## 2016-05-05 ENCOUNTER — Ambulatory Visit (INDEPENDENT_AMBULATORY_CARE_PROVIDER_SITE_OTHER): Payer: Medicare Other | Admitting: Internal Medicine

## 2016-05-05 VITALS — BP 130/74 | HR 65 | Temp 98.2°F | Ht 66.0 in | Wt 153.9 lb

## 2016-05-05 DIAGNOSIS — L602 Onychogryphosis: Secondary | ICD-10-CM

## 2016-05-05 DIAGNOSIS — B353 Tinea pedis: Secondary | ICD-10-CM | POA: Diagnosis not present

## 2016-05-05 DIAGNOSIS — M21612 Bunion of left foot: Secondary | ICD-10-CM

## 2016-05-05 NOTE — Assessment & Plan Note (Addendum)
A: Patient's first great toe on her left foot has lifted off nail bed. She has no pain in her feet, denies any numbness or tingling. She does not have diabetes nor has ever smoked before. She does not have to shake her legs out of night. Her son noticed skin changes on her left foot and wanted her to be seen in clinic for this. She does not have a history of gout. On exam she has tinea pedis bilaterally with bunion  and left foot that she takes care of on her own. She has overlying chronic skin changes that has not needed her. Feet are warm and well-perfused.   Plan: Patient has tinea pedis bilaterally. Left first great toe nail likely will fall off on its own without need for intervention today. Referral placed for podiatry for  management of  hypertrophic nails and bunion.

## 2016-05-05 NOTE — Progress Notes (Signed)
Internal Medicine Clinic Attending  I saw and evaluated the patient.  I personally confirmed the key portions of the history and exam documented by Dr. Truong and I reviewed pertinent patient test results.  The assessment, diagnosis, and plan were formulated together and I agree with the documentation in the resident's note.  

## 2016-05-05 NOTE — Progress Notes (Signed)
   CC: lt great toe pain  HPI:  [name] is a 83 y.o. with past medical history as outlined below presents to clinic for pain in her left big toe. Please see problem list for details.  Past Medical History:  Diagnosis Date  . Atrial fibrillation (HCC)    on coumadin since 2012, recently stopped because of vaginal bleeding  . CHF (congestive heart failure) (HCC)   . Hyperlipidemia   . Hypertension   . Routine adult health maintenance 04/09/2012    Review of Systems:  Denies foot numbness, tingling, or claudication sx.   Physical Exam:  Vitals:   05/05/16 1409  BP: 130/74  Pulse: 65  Temp: 98.2 F (36.8 C)  TempSrc: Oral  SpO2: 100%  Weight: 153 lb 14.4 oz (69.8 kg)  Height: 5\' 6"  (1.676 m)   Physical Exam  Constitutional:  appears well-developed and well-nourished. No distress.  HENT:  Head: Normocephalic and atraumatic.  Nose: Nose normal.  Ext: 2+ pedal edema b/l, tinea pedis b/l, left foot with bunion and last digit corn with overlying pink chronic skin changes. Left 1st great toe mildly thickened that has lifted off nail bed and is non tender. No joint tenderness.   Assessment & Plan:   See Encounters Tab for problem based charting.  Patient seen with Dr. Oswaldo Done

## 2016-05-12 ENCOUNTER — Telehealth: Payer: Self-pay

## 2016-05-12 NOTE — Telephone Encounter (Signed)
I did a PA for the pts Digoxin (covermymeds) and received a fax from [employer] stating that this medication does not require a PA. Reference# [id number]

## 2016-05-16 MED FILL — DIGITEK 125 MCG TABLET: 125 | 90 days supply | Qty: 90 | Fill #0

## 2016-05-18 ENCOUNTER — Telehealth: Payer: Self-pay | Admitting: Internal Medicine

## 2016-05-18 NOTE — Telephone Encounter (Signed)
Calling to confirm appointment for 05/19/16 at 1:15 lmtcb ° °

## 2016-05-19 ENCOUNTER — Ambulatory Visit (INDEPENDENT_AMBULATORY_CARE_PROVIDER_SITE_OTHER): Payer: Medicare Other | Admitting: Internal Medicine

## 2016-05-19 VITALS — BP 113/69 | HR 56 | Temp 98.2°F | Wt 156.6 lb

## 2016-05-19 DIAGNOSIS — R490 Dysphonia: Secondary | ICD-10-CM | POA: Diagnosis not present

## 2016-05-19 DIAGNOSIS — Z8249 Family history of ischemic heart disease and other diseases of the circulatory system: Secondary | ICD-10-CM

## 2016-05-19 DIAGNOSIS — I5022 Chronic systolic (congestive) heart failure: Secondary | ICD-10-CM | POA: Diagnosis not present

## 2016-05-19 DIAGNOSIS — Z79899 Other long term (current) drug therapy: Secondary | ICD-10-CM

## 2016-05-19 DIAGNOSIS — I480 Paroxysmal atrial fibrillation: Secondary | ICD-10-CM

## 2016-05-19 DIAGNOSIS — I110 Hypertensive heart disease with heart failure: Secondary | ICD-10-CM | POA: Diagnosis not present

## 2016-05-19 DIAGNOSIS — Z0000 Encounter for general adult medical examination without abnormal findings: Secondary | ICD-10-CM

## 2016-05-19 DIAGNOSIS — I10 Essential (primary) hypertension: Secondary | ICD-10-CM

## 2016-05-19 MED ORDER — FUROSEMIDE 40 MG PO TABS: ORAL_TABLET | ORAL | 0 refills | Status: DC

## 2016-05-19 NOTE — Assessment & Plan Note (Signed)
Patient is a long history of significant systolic heart failure which has resulted in multiple hospital admissions and had difficulty controlling her volume status at the end of last year. Patient's Lasix were titrated and she achieved euvolemia on fluid restriction and 40 mg twice a day Lasix for several months now. Patient is followed closely by cardiology. In the past however patient has refused all aggressive management including AICD placement. Patient is not currently taking an ACE inhibitor, it is unclear why the patient has not been on this medication however given her controlled to soft blood pressures and her wish to avoid medication changes, we will defer this change currently. I feel the patient would not likely benefit from cardiac remodeling advantages of this medication given her advanced age and limited current life expectancy.  The patient's baseline functionality is relatively limited due to shortness of breath which is likely contributed by severe aortic stenosis. Patient is unable to walk more than 25 feet without getting short of breath. Patient reports that this is worsened slightly over the last several days as well as worsening of her lower extremity edema which is currently 3+ on exam. Patient has no rales or crackles on exam. Patient does also report some worsening orthopnea over the last 1-2 days.  On further questioning patient reports that she has not taken her Lasix medication as prescribed over the last 2 days. Pt's son who is her regular care taker and manager of her medications confirms this noncompliance recently. Therefore I would like to increase her current Lasix to 80 mg in the morning and 40 mg in the afternoon for the next several days and have encouraged patient to weigh herself regularly in order to return her fluid status to euvolemic, and attempt to address her current symptoms of worsened shortness of breath and lower show any edema.  Assessment: Severe systolic  congestive heart failure, refusal of AICD, with recent increase in third space volume  Plan: Increase Lasix to 80 mg in the morning and 40 mg in the evening, daily weights, return precautions given regarding dizziness or leg cramping, we'll follow-up with the patient by phone in one week

## 2016-05-19 NOTE — Assessment & Plan Note (Signed)
She complains of hoarseness which began 3-4 months ago. She reports that this is only present when she has extended use of her voice including talking on the phone and talking with her family. Patient denies any sore throat, cough, upper respiratory infectious symptoms, painful lymph nodes, weight loss. She reports that her symptoms are made better by hot beverages as well as peppermint hard candies. On exam patient has no red flags including lymphadenopathy, erythema or swelling of her posterior oropharynx, or any signs of upper respiratory infectious process. She does not have high risk history for malignancy including no alcohol use and no tobacco use, negative family history. Patient does sound mildly hoarse on exam and she reports that her voice seems higher pitched than normal. This problem is not significantly bothersome to the patient has been getting good relief with her current home remedies.  Given the patient's goals of care regarding conservative management and the lack of any red flags, I would not pursue any further workup of this problem. I suspect that this is mostly related to laryngeal overuse and her current therapies will be most beneficial. Patient has a history of multinodular goiter this does not appear progressed since last exam and have low suspicion this is contributing.  Assessment: Vocal cord overuse  Plan: Voice rest when symptomatically, hot beverages and peppermint hard candies as needed

## 2016-05-19 NOTE — Assessment & Plan Note (Signed)
Patient is a long history of paroxysmal atrial fibrillation but has declined anticoagulation in the past. She is currently rate controlled with metoprolol 100 mg extended release. Her rates are appropriately blocked. She denies any symptoms of palpitations. She is irregularly irregular on exam.  Assessment: Rate controlled paroxysmal atrial fibrillation, asymptomatic, not on anticoagulation per patient preference  Plan: Continue rate control with metoprolol 100 mg extended release daily

## 2016-05-19 NOTE — Assessment & Plan Note (Signed)
Patient was offered her health maintenance vaccinations today including her Pneumovax. Patient declines vaccination.

## 2016-05-19 NOTE — Patient Instructions (Addendum)
It was a pleasure to see you in the clinic today.  For the excess fluid in your legs and her shortness of breath I would like to increase the Lasix fluid pill that you've been taking. Please take 1 additional pill every morning in addition to your previous doses. Total I would like you to take 2 pills in the morning and one pill in the afternoon, this should be 120 mg total every day.  Please weigh yourself every day or as often as possible in order to keep track of how much weight your losing. I will plan to check in with you in 2 weeks by phone in order to assess whether we need to continue the extra dose of this Lasix medication. If you notice worsening of your dry mouth, leg cramps, or dizziness when he stand up or walk around, please stop taking your Lasix medication and call our clinic for further instructions.  I have not found anything concerning to suggest that the hoarseness is related to a larger problem. I suspect that this is just related to overuse when you talk for long periods of time, and would recommend you continue to use peppermint hard candies for relief, and hot drinks.  I will plan to follow-up with you in one month to reassess your swelling and how much fluid you have retained.

## 2016-05-19 NOTE — Assessment & Plan Note (Addendum)
Patient's blood pressure is currently very well controlled on her furosemide and metoprolol. She denies any symptoms of dizziness, headache, palpitations, chest pain.  Assessment: Well-controlled hypertension  Plan: Continue current therapies.

## 2016-05-19 NOTE — Progress Notes (Signed)
CC: CHF, HTN, AS f/u HPI: [name] is a 83 y.o. female with a h/o of CHF, PAF, AS, HTN with GoC consistent with conservative management who presents for routine f/u of the above conditions.  Please see Problem-based charting for HPI and the status of patient's chronic medical conditions.  Past Medical History:  Diagnosis Date  . Atrial fibrillation (HCC)    on coumadin since 2012, recently stopped because of vaginal bleeding  . CHF (congestive heart failure) (HCC)   . Hyperlipidemia   . Hypertension   . Routine adult health maintenance 04/09/2012   Social History  Substance Use Topics  . Smoking status: Never Smoker  . Smokeless tobacco: Never Used  . Alcohol use No   Family History  Problem Relation Age of Onset  . Hypertension Mother   . Hypertension Father   . Thyroid disease Neg Hx    Review of Systems: ROS in HPI. Otherwise: Review of Systems  Constitutional: Negative for chills, fever and weight loss.  HENT: Negative for congestion, sinus pain and sore throat.   Respiratory: Positive for shortness of breath. Negative for cough, sputum production, wheezing and stridor.   Cardiovascular: Positive for orthopnea and leg swelling. Negative for chest pain, palpitations and PND.  Gastrointestinal: Negative for abdominal pain, constipation, diarrhea, nausea and vomiting.  Genitourinary: Negative for dysuria, frequency and urgency.  Neurological: Negative for dizziness and weakness.   Physical Exam: Vitals:   05/19/16 1315  BP: 113/69  Pulse: (!) 56  Temp: 98.2 F (36.8 C)  TempSrc: Oral  SpO2: 98%  Weight: 156 lb 9.6 oz (71 kg)   Physical Exam  Constitutional: She appears well-developed. She is cooperative. No distress.  HENT:  Mouth/Throat: Oropharynx is clear and moist. No oropharyngeal exudate.  Eyes: Pupils are equal, round, and reactive to light.  Neck: No JVD present.  Cardiovascular: Normal rate, regular rhythm, normal heart sounds and normal  pulses.  Exam reveals no gallop.   No murmur heard. Pulmonary/Chest: Effort normal and breath sounds normal. No respiratory distress. She has no wheezes. She has no rhonchi. She has no rales. Breasts are symmetrical.  Abdominal: Soft. Bowel sounds are normal. There is no tenderness.  Musculoskeletal: She exhibits edema (3+ BLE).    Assessment & Plan:  See encounters tab for problem based medical decision making. Patient discussed with Dr. Cleda Daub  Routine adult health maintenance Patient was offered her health maintenance vaccinations today including her Pneumovax. Patient declines vaccination.  Paroxysmal atrial fibrillation (HCC) Patient is a long history of paroxysmal atrial fibrillation but has declined anticoagulation in the past. She is currently rate controlled with metoprolol 100 mg extended release. Her rates are appropriately blocked. She denies any symptoms of palpitations. She is irregularly irregular on exam.  Assessment: Rate controlled paroxysmal atrial fibrillation, asymptomatic, not on anticoagulation per patient preference  Plan: Continue rate control with metoprolol 100 mg extended release daily  Hoarseness She complains of hoarseness which began 3-4 months ago. She reports that this is only present when she has extended use of her voice including talking on the phone and talking with her family. Patient denies any sore throat, cough, upper respiratory infectious symptoms, painful lymph nodes, weight loss. She reports that her symptoms are made better by hot beverages as well as peppermint hard candies. On exam patient has no red flags including lymphadenopathy, erythema or swelling of her posterior oropharynx, or any signs of upper respiratory infectious process. She does not have high risk  history for malignancy including no alcohol use and no tobacco use, negative family history. Patient does sound mildly hoarse on exam and she reports that her voice seems higher  pitched than normal. This problem is not significantly bothersome to the patient has been getting good relief with her current home remedies.  Given the patient's goals of care regarding conservative management and the lack of any red flags, I would not pursue any further workup of this problem. I suspect that this is mostly related to laryngeal overuse and her current therapies will be most beneficial. Patient has a history of multinodular goiter this does not appear progressed since last exam and have low suspicion this is contributing.  Assessment: Vocal cord overuse  Plan: Voice rest when symptomatically, hot beverages and peppermint hard candies as needed  Essential hypertension Patient's blood pressure is currently very well controlled on her furosemide and metoprolol. She denies any symptoms of dizziness, headache, palpitations, chest pain.  Assessment: Well-controlled hypertension  Plan: Continue current therapies.  Chronic systolic congestive heart failure (HCC) Patient is a long history of significant systolic heart failure which has resulted in multiple hospital admissions and had difficulty controlling her volume status at the end of last year. Patient's Lasix were titrated and she achieved euvolemia on fluid restriction and 40 mg twice a day Lasix for several months now. Patient is followed closely by cardiology. In the past however patient has refused all aggressive management including AICD placement. Patient is not currently taking an ACE inhibitor, it is unclear why the patient has not been on this medication however given her controlled to soft blood pressures and her wish to avoid medication changes, we will defer this change currently. I feel the patient would not likely benefit from cardiac remodeling advantages of this medication given her advanced age and limited current life expectancy.  The patient's baseline functionality is relatively limited due to shortness of  breath which is likely contributed by severe aortic stenosis. Patient is unable to walk more than 25 feet without getting short of breath. Patient reports that this is worsened slightly over the last several days as well as worsening of her lower extremity edema which is currently 3+ on exam. Patient has no rales or crackles on exam. Patient does also report some worsening orthopnea over the last 1-2 days.  On further questioning patient reports that she has not taken her Lasix medication as prescribed over the last 2 days. Pt's son who is her regular care taker and manager of her medications confirms this noncompliance recently. Therefore I would like to increase her current Lasix to 80 mg in the morning and 40 mg in the afternoon for the next several days and have encouraged patient to weigh herself regularly in order to return her fluid status to euvolemic, and attempt to address her current symptoms of worsened shortness of breath and lower show any edema.  Assessment: Severe systolic congestive heart failure, refusal of AICD, with recent increase in third space volume  Plan: Increase Lasix to 80 mg in the morning and 40 mg in the evening, daily weights, return precautions given regarding dizziness or leg cramping, we'll follow-up with the patient by phone in one week   Signed: Carolynn Comment, MD 05/19/2016, 2:27 PM  Pager: [phone]

## 2016-05-23 ENCOUNTER — Encounter: Payer: Self-pay | Admitting: Podiatry

## 2016-05-23 ENCOUNTER — Ambulatory Visit (INDEPENDENT_AMBULATORY_CARE_PROVIDER_SITE_OTHER): Payer: Medicare Other

## 2016-05-23 ENCOUNTER — Ambulatory Visit (INDEPENDENT_AMBULATORY_CARE_PROVIDER_SITE_OTHER): Payer: Medicare Other | Admitting: Podiatry

## 2016-05-23 VITALS — BP 137/64 | HR 60 | Resp 16 | Ht 63.0 in | Wt 159.0 lb

## 2016-05-23 DIAGNOSIS — I999 Unspecified disorder of circulatory system: Secondary | ICD-10-CM | POA: Diagnosis not present

## 2016-05-23 DIAGNOSIS — M2010 Hallux valgus (acquired), unspecified foot: Secondary | ICD-10-CM | POA: Diagnosis not present

## 2016-05-23 DIAGNOSIS — M79604 Pain in right leg: Secondary | ICD-10-CM

## 2016-05-23 DIAGNOSIS — B351 Tinea unguium: Secondary | ICD-10-CM

## 2016-05-23 DIAGNOSIS — M79605 Pain in left leg: Secondary | ICD-10-CM | POA: Diagnosis not present

## 2016-05-23 MED FILL — LATANOPROST 0.005% EYE DRP: 0.005 | 25 days supply | Qty: 3 | Fill #2

## 2016-05-23 NOTE — Progress Notes (Signed)
   Subjective:    Patient ID: Jean Moore, female    DOB: March 31, 1926, 83 y.o.   MRN: [id number]  HPI Chief Complaint  Patient presents with  . Foot Pain    Bilateral; bunion; swelling; pt stated, "Feet and legs are swelling"  . Nail Problem    Left foot; great toe; pt stated, "Nail split 2-3 weeks ago"        Review of Systems  All other systems reviewed and are negative.      Objective:   Physical Exam        Assessment & Plan:

## 2016-05-23 NOTE — Progress Notes (Signed)
Internal Medicine Clinic Attending  Case discussed with Dr. Strelow at the time of the visit.  We reviewed the resident's history and exam and pertinent patient test results.  I agree with the assessment, diagnosis, and plan of care documented in the resident's note.  

## 2016-05-23 NOTE — Progress Notes (Signed)
Subjective:     Patient ID: Jean Moore, female   DOB: 06/16/1926, 83 y.o.   MRN: [id number]  HPI patient presents with caregiver with a damaged left hallux nail that's painful and slight breakdown of tissue but no drainage or subcutaneous exposure around the left first metatarsal head and between the left hallux and second toes. Patient family admits she has been wearing shoe gear more than she should   Review of Systems  All other systems reviewed and are negative.      Objective:   Physical Exam  Constitutional: She is oriented to person, place, and time.  Neurological: She is oriented to person, place, and time.  Skin: Skin is warm and dry.  Nursing note and vitals reviewed.  I noted vascular status to be diminished both DP and PT pulses and there is diminished hair growth noted. Patient is 83 years old and is noted to have diminished range of motion subtalar midtarsal joint with moderate equinus condition bilateral. Patient does have a damaged left hallux nail that's very painful thick and partially loosened from the underlying nailbed and she is well oriented at the current time     Assessment:     At risk patient with no indications of claudication symptoms upon questioning but does have diminished pulses bilateral with slight irritation of the left first metatarsal and damaged hallux nail left    Plan:     H&P discussed condition and as long as she is not breaking down are ulcerating or showing signs of claudication night pain we are not can to get extensive vascular studies done. At this point I did using sterile instrumentation debrided the hallux nail left getting distal two thirds of the nail off and instructed on soaks and bandage therapy. Patient will be seen back for [place] to recheck again as needed and is given strict instructions of any breakdown of tissue were to occur she is to reappoint immediately  X-rays indicate structural bunion deformity bilateral with calcification  and osteoporosis of underlying metatarsal shafts and heads

## 2016-06-13 MED FILL — COMBIGAN EYE DROPS: 0.2-0.5 | 25 days supply | Qty: 5 | Fill #3

## 2016-06-15 MED FILL — LATANOPROST 0.005% EYE DRP: 0.005 | 25 days supply | Qty: 3 | Fill #0

## 2016-06-22 MED FILL — POTASSIUM CL ER 20 MEQ TABL: 20 | 45 days supply | Qty: 90 | Fill #3

## 2016-06-23 ENCOUNTER — Encounter: Payer: Self-pay | Admitting: Internal Medicine

## 2016-06-23 ENCOUNTER — Ambulatory Visit (INDEPENDENT_AMBULATORY_CARE_PROVIDER_SITE_OTHER): Payer: Medicare Other | Admitting: Internal Medicine

## 2016-06-23 ENCOUNTER — Encounter (INDEPENDENT_AMBULATORY_CARE_PROVIDER_SITE_OTHER): Payer: Self-pay

## 2016-06-23 VITALS — BP 100/59 | Temp 97.8°F | Ht 63.0 in | Wt 139.5 lb

## 2016-06-23 DIAGNOSIS — I5022 Chronic systolic (congestive) heart failure: Secondary | ICD-10-CM | POA: Diagnosis not present

## 2016-06-23 DIAGNOSIS — W228XXD Striking against or struck by other objects, subsequent encounter: Secondary | ICD-10-CM | POA: Diagnosis not present

## 2016-06-23 DIAGNOSIS — Z8249 Family history of ischemic heart disease and other diseases of the circulatory system: Secondary | ICD-10-CM | POA: Diagnosis not present

## 2016-06-23 DIAGNOSIS — N3941 Urge incontinence: Secondary | ICD-10-CM | POA: Diagnosis not present

## 2016-06-23 DIAGNOSIS — S91201D Unspecified open wound of right great toe with damage to nail, subsequent encounter: Secondary | ICD-10-CM

## 2016-06-23 DIAGNOSIS — S99829A Other specified injuries of unspecified foot, initial encounter: Secondary | ICD-10-CM

## 2016-06-23 NOTE — Assessment & Plan Note (Signed)
Pt is doing very well. Her functional status has improved with diuresis. Wt now stable at 139-140lbs on 80mg  AM + 40mg  PM Lasix daily. No dizziness or muscle cramps to suggest over diuresis. Lungs clear today on exam. Able to tolerate 7-8 steps w/o rest. Still with 2-3+ BLE edema, but this is dependent and worse with prolonged sitting and standing. Venous stasis skin changes. Does not tolerate compression stockings. Recommend elevation with seated. Counseled on skin checks to avoid breakdown from edema.  Assessment: Euvolemic CHF  Plan: Continue current diuresis regimen and K supplementation, follow daily wts.

## 2016-06-23 NOTE — Progress Notes (Signed)
   CC: CHF f/u HPI: [name] is a 83 y.o. female with a h/o of CHF, PAF, AS, HTN with GoC consistent with conservative management who presents for f/u of CHF after titration of Lasix 1 month ago.  Please see Problem-based charting for HPI and the status of patient's chronic medical conditions.  Past Medical History:  Diagnosis Date  . Atrial fibrillation (HCC)    on coumadin since 2012, recently stopped because of vaginal bleeding  . CHF (congestive heart failure) (HCC)   . Hyperlipidemia   . Hypertension   . Routine adult health maintenance 04/09/2012   Social History  Substance Use Topics  . Smoking status: Never Smoker  . Smokeless tobacco: Never Used  . Alcohol use No   Family History  Problem Relation Age of Onset  . Hypertension Mother   . Hypertension Father   . Thyroid disease Neg Hx    Review of Systems: ROS in HPI. Otherwise: Review of Systems  Constitutional: Negative for chills, fever, malaise/fatigue and weight loss.  Respiratory: Negative for cough and shortness of breath.   Cardiovascular: Negative for chest pain and leg swelling.  Gastrointestinal: Negative for abdominal pain, constipation, diarrhea, nausea and vomiting.  Genitourinary: Negative for dysuria, frequency and urgency.  Neurological: Negative for weakness and headaches.   Physical Exam: Vitals:   06/23/16 1334  BP: (!) 100/59  Temp: 97.8 F (36.6 C)  TempSrc: Oral  SpO2: 98%  Weight: 139 lb 8 oz (63.3 kg)  Height: 5\' 3"  (1.6 m)   Physical Exam  Constitutional: She appears well-developed. She is cooperative. No distress.  Cardiovascular: Normal rate, regular rhythm, normal heart sounds and normal pulses.  Exam reveals no gallop.   No murmur heard. Pulmonary/Chest: Effort normal and breath sounds normal. No respiratory distress. She has no wheezes. She has no rhonchi. She has no rales. Breasts are symmetrical.  Abdominal: Soft. Bowel sounds are normal. There is no tenderness.    Musculoskeletal: She exhibits edema (2+ BLE, no TTP, mild skin changes from stasis).  Several small wounds on R foot, no s/s of infection. No drainage or smell. R great toe w/o nail.   Assessment & Plan:  See encounters tab for problem based medical decision making. Patient discussed with Dr. Josem Kaufmann  Urge incontinence Pt is having incontinence of urine 2/2 urgency. This is limiting her ability to go out in public. She denies any pelvic pain, retentions, dysuria.  Assessment: Urge incontinence  Plan: Depends diapers, scheduled voiding  Chronic systolic congestive heart failure (HCC) Pt is doing very well. Her functional status has improved with diuresis. Wt now stable at 139-140lbs on 80mg  AM + 40mg  PM Lasix daily. No dizziness or muscle cramps to suggest over diuresis. Lungs clear today on exam. Able to tolerate 7-8 steps w/o rest. Still with 2-3+ BLE edema, but this is dependent and worse with prolonged sitting and standing. Venous stasis skin changes. Does not tolerate compression stockings. Recommend elevation with seated. Counseled on skin checks to avoid breakdown from edema.  Assessment: Euvolemic CHF  Plan: Continue current diuresis regimen and K supplementation, follow daily wts.  Toenail torn away Pt had a toenail which was partially ripped off when she stubbed her toe. She was seen by Podiatry who removed the nail. No significant pain, no s/s of infection. Healthy nail bed tissue apparent on exam. Continue to monitor.   Signed: Carolynn Comment, MD 06/23/2016, 2:28 PM  Pager: [phone]

## 2016-06-23 NOTE — Patient Instructions (Addendum)
I am very glad to see you doing well. Please keep up the good work.  Continue your current regimen with 80mg  of Lasix in the morning and 40mg  of Lasix in the evening. Weigh your self daily and please notify the clinic if you gain more that 2 pounds in a day, or if you gain more that 5 pounds in a week, as these may be signs of gaining fluid.  I will order pull-up for your urinary incontinence and some compression stockings to help with the swelling in your legs. If you do not like the compression stockings, just make sure to elevate your legs throughout the day to help. Also remember to check your feet and legs for sores that may be new or changes and let the clinic know about any of these.  I will plan to see you in 3 months unless you need me sooner!

## 2016-06-23 NOTE — Progress Notes (Signed)
Case discussed with Dr. Strelow at the time of the visit. We reviewed the resident's history and exam and pertinent patient test results. I agree with the assessment, diagnosis, and plan of care documented in the resident's note. 

## 2016-06-23 NOTE — Assessment & Plan Note (Signed)
Pt had a toenail which was partially ripped off when she stubbed her toe. She was seen by Podiatry who removed the nail. No significant pain, no s/s of infection. Healthy nail bed tissue apparent on exam. Continue to monitor.

## 2016-06-23 NOTE — Assessment & Plan Note (Signed)
Pt is having incontinence of urine 2/2 urgency. This is limiting her ability to go out in public. She denies any pelvic pain, retentions, dysuria.  Assessment: Urge incontinence  Plan: Depends diapers, scheduled voiding

## 2016-07-06 MED FILL — LATANOPROST 0.005% EYE DRP: 0.005 | 25 days supply | Qty: 3 | Fill #3

## 2016-07-06 MED FILL — FUROSEMIDE 40 MG TABLET: 40 | 45 days supply | Qty: 180 | Fill #1

## 2016-07-15 ENCOUNTER — Encounter: Payer: Self-pay | Admitting: *Deleted

## 2016-07-15 MED FILL — COMBIGAN EYE DROPS: 0.2-0.5 | 25 days supply | Qty: 5 | Fill #4

## 2016-08-09 MED FILL — DIGOXIN 0.125 MG TABLET: 125 | 90 days supply | Qty: 90 | Fill #1

## 2016-08-09 MED FILL — POTASSIUM CL ER 20 MEQ TABL: 20 | 45 days supply | Qty: 90 | Fill #4

## 2016-08-09 MED FILL — ATORVASTATIN 40 MG TABLET: 40 | 90 days supply | Qty: 90 | Fill #3

## 2016-08-11 MED FILL — LATANOPROST 0.005% EYE DRP: 0.005 | 25 days supply | Qty: 3 | Fill #4

## 2016-08-17 MED FILL — COMBIGAN EYE DROPS: 0.2-0.5 | 25 days supply | Qty: 5 | Fill #5

## 2016-09-19 MED FILL — FUROSEMIDE 40 MG TABLET: 40 | 45 days supply | Qty: 180 | Fill #2

## 2016-09-20 MED FILL — LATANOPROST 0.005% EYE DRP: 0.005 | 25 days supply | Qty: 3 | Fill #5

## 2016-09-20 MED FILL — COMBIGAN EYE DROPS: 0.2-0.5 | 25 days supply | Qty: 5 | Fill #6

## 2016-09-29 ENCOUNTER — Ambulatory Visit (INDEPENDENT_AMBULATORY_CARE_PROVIDER_SITE_OTHER): Payer: Medicare Other | Admitting: Internal Medicine

## 2016-09-29 VITALS — BP 110/68 | HR 76 | Temp 97.5°F | Ht 63.0 in | Wt 143.3 lb

## 2016-09-29 DIAGNOSIS — L608 Other nail disorders: Secondary | ICD-10-CM

## 2016-09-29 DIAGNOSIS — R238 Other skin changes: Secondary | ICD-10-CM

## 2016-09-29 DIAGNOSIS — S99829A Other specified injuries of unspecified foot, initial encounter: Secondary | ICD-10-CM

## 2016-09-29 DIAGNOSIS — R239 Unspecified skin changes: Secondary | ICD-10-CM

## 2016-09-29 NOTE — Patient Instructions (Signed)
It was a pleasure to meet you today. Please come back if your toe being to swell, become hot, or extremely painful.

## 2016-09-29 NOTE — Progress Notes (Signed)
   CC: Left toe pain  HPI:  [name] is a 83 y.o. with a PMHx significant for HFrEF presenting 4 months s/p toenail removal of the big toe. Family is concerned that the toenail is not healing properly and taking so long. She was re-evaluated by podiatry (who removed the toenail) and was told that it was nothing to be worried about. Since they have felt the toe looks more discolored and cooler compared to baseline. They have no appreciated any drainage or discharge. Denies fever, chills, swelling of the toe, N/V, or fatigue.   Past Medical History:  Diagnosis Date  . Atrial fibrillation (HCC)    on coumadin since 2012, recently stopped because of vaginal bleeding  . CHF (congestive heart failure) (HCC)   . Hyperlipidemia   . Hypertension   . Routine adult health maintenance 04/09/2012   Review of Systems:  Review of Systems  Constitutional: Negative for chills, fever and malaise/fatigue.  Respiratory: Negative.   Cardiovascular: Positive for leg swelling. Negative for chest pain.  Gastrointestinal: Negative for abdominal pain, nausea and vomiting.  Genitourinary: Negative.   Musculoskeletal: Negative.   Skin: Negative for itching and rash.  Neurological: Negative.    Physical Exam: Vitals:   09/29/16 1033  BP: 110/68  Pulse: 76  Temp: (!) 97.5 F (36.4 C)  TempSrc: Oral  SpO2: 100%  Weight: 143 lb 4.8 oz (65 kg)  Height: 5\' 3"  (1.6 m)   Physical Exam  Constitutional: She is oriented to person, place, and time. She appears well-developed and well-nourished.  HENT:  Head: Normocephalic and atraumatic.  Eyes: Pupils are equal, round, and reactive to light. EOM are normal.  Cardiovascular: Normal rate.   Irregular rhythm, systolic murmur, pulses of the LEs unable to be palpated, found bilaterally with doppler.  Pulmonary/Chest: Effort normal and breath sounds normal. No respiratory distress. She has no wheezes.  Abdominal: Soft. Bowel sounds are normal. There is no  tenderness.  Musculoskeletal: She exhibits edema (moderate pitting edema of the LEs).  Feet cold to the tough  Neurological: She is alert and oriented to person, place, and time.   Assessment & Plan:   See Encounters Tab for problem based charting.  Patient seen with Dr. Cyndie Chime

## 2016-09-29 NOTE — Assessment & Plan Note (Signed)
Family concerned about the length of time it is taking for the patient's toenail to grow back on the left great toe. Also has noticed the foot feels colder than baseline and darkening of the skin on the foot.   PE significant for cold feet with discoloration of the feet bilateral. Attempts to get ABIs were discontinued as the patient was in pain and the son requested we not do it. Doppler was used to identify the dorsalis pedis and posterior tibial pulse.  Plan: - Continue to monitor  - Poor circulation in the LE  - Educated to return if toe becomes swollen, red, painful, or beings to drain.

## 2016-09-29 NOTE — Progress Notes (Signed)
Medicine attending: I personally interviewed and briefly examined this patient on the day of the patient visit and reviewed pertinent clinical ,laboratory, data  with resident physician Dr. Levora Dredge and we discussed a management plan. Lately just had a podiatry visit and extraction of a nail from the left great toe.  Toe is still uncomfortable but not really bothering her.  No sign of infection on exam.  Not tender to palpation.  Patient's son is concerned because her foot is cold.  She is 83 years old.  Both feet are cool.  Dorsalis pedis and posterior tibial pulses are not palpable but there are no cyanotic changes.  Radial pulses are 2+ and symmetric. She likely has peripheral vascular disease.  Her son did not want [place] to do ABIs.  Dr. Caron Presume checked pulses with superficial Doppler probe.

## 2016-10-05 ENCOUNTER — Ambulatory Visit (INDEPENDENT_AMBULATORY_CARE_PROVIDER_SITE_OTHER): Payer: Medicare Other | Admitting: Internal Medicine

## 2016-10-05 ENCOUNTER — Encounter: Payer: Self-pay | Admitting: Internal Medicine

## 2016-10-05 VITALS — BP 109/66 | HR 72 | Temp 97.8°F | Ht 66.0 in | Wt 143.6 lb

## 2016-10-05 DIAGNOSIS — I110 Hypertensive heart disease with heart failure: Secondary | ICD-10-CM

## 2016-10-05 DIAGNOSIS — Z79899 Other long term (current) drug therapy: Secondary | ICD-10-CM

## 2016-10-05 DIAGNOSIS — L97521 Non-pressure chronic ulcer of other part of left foot limited to breakdown of skin: Secondary | ICD-10-CM

## 2016-10-05 DIAGNOSIS — S99829A Other specified injuries of unspecified foot, initial encounter: Secondary | ICD-10-CM

## 2016-10-05 DIAGNOSIS — L98498 Non-pressure chronic ulcer of skin of other sites with other specified severity: Secondary | ICD-10-CM | POA: Diagnosis not present

## 2016-10-05 DIAGNOSIS — R0982 Postnasal drip: Secondary | ICD-10-CM | POA: Diagnosis not present

## 2016-10-05 DIAGNOSIS — R011 Cardiac murmur, unspecified: Secondary | ICD-10-CM | POA: Diagnosis not present

## 2016-10-05 DIAGNOSIS — I5022 Chronic systolic (congestive) heart failure: Secondary | ICD-10-CM

## 2016-10-05 DIAGNOSIS — R350 Frequency of micturition: Secondary | ICD-10-CM | POA: Diagnosis not present

## 2016-10-05 DIAGNOSIS — G4720 Circadian rhythm sleep disorder, unspecified type: Secondary | ICD-10-CM | POA: Diagnosis not present

## 2016-10-05 DIAGNOSIS — E785 Hyperlipidemia, unspecified: Secondary | ICD-10-CM | POA: Diagnosis not present

## 2016-10-05 DIAGNOSIS — L97511 Non-pressure chronic ulcer of other part of right foot limited to breakdown of skin: Secondary | ICD-10-CM

## 2016-10-05 DIAGNOSIS — Z66 Do not resuscitate: Secondary | ICD-10-CM

## 2016-10-05 DIAGNOSIS — I480 Paroxysmal atrial fibrillation: Secondary | ICD-10-CM | POA: Diagnosis not present

## 2016-10-05 DIAGNOSIS — I10 Essential (primary) hypertension: Secondary | ICD-10-CM

## 2016-10-05 DIAGNOSIS — Z7982 Long term (current) use of aspirin: Secondary | ICD-10-CM

## 2016-10-05 DIAGNOSIS — L97529 Non-pressure chronic ulcer of other part of left foot with unspecified severity: Secondary | ICD-10-CM

## 2016-10-05 DIAGNOSIS — L608 Other nail disorders: Secondary | ICD-10-CM | POA: Diagnosis not present

## 2016-10-05 DIAGNOSIS — L97519 Non-pressure chronic ulcer of other part of right foot with unspecified severity: Secondary | ICD-10-CM

## 2016-10-05 NOTE — Assessment & Plan Note (Signed)
Daughter states that patient will sleep during the day and stay awake at night; this worries her as patient may fall when no assistance is available. Patient is not bothered by her sleep-wake cycle reversal; she is hesitant (as am I) to start any medications. She has a walker at home and I advised that she have that with her at all times.

## 2016-10-05 NOTE — Assessment & Plan Note (Signed)
Patient with systolic CHF with last ejection fraction of 15%; patient without an ICD due to preference. She has stable 2+ pitting edema to her knees which is in part contributed to by likely venous stasis. She denies shortness of breath or orthopnea (she sleeps on 3 pillows but denies shortness of breath lying flat). She maintains compliance with furosemide 80 mg in the morning and 40mg  in the evening, and potassium 20 mEq twice a day.  She was maintains compliance with metoprolol 50 mg daily and digoxin 0.125 mg daily. She is not on an ace/arb; due to her age and comorbidities she would likely not benefit from this addition at this time.  Plan --Continue Lasix and potassium --Continue digoxin and metoprolol --Patient followed the heart failure clinic --Follow-up bmet - adjust potassium as indicated

## 2016-10-05 NOTE — Assessment & Plan Note (Signed)
Patient with likely ischemic ulcers on left medial MCP and right lateral pinky toe. Patient states that these have been there for over a year. On exam does not appear that these are infected. Pulses in her feet bilaterally are not palpable but her extremities are warm. ABIs were attempted at prior visit but not able to be completed due to pain. I discussed at length options for management with patient and her daughter. Patient did not want to get her ABIs done today. She is in agreement with home health wound care for continued management of her ischemic ulcers. She will discuss with her and her family about referral to vascular surgery for possibility of revascularization. I'll call patient next week to discuss her decision for vascular surgery referral.  Plan: --HH wound care referral placed --will call pt next week to discuss vasc surg referral/ABIs

## 2016-10-05 NOTE — Assessment & Plan Note (Signed)
Patient continues to be concerned about appearance of her left great toenail. On exam it does not look infected. Explained to patient due to her likely peripheral artery disease we would not expect for her toenail to grow back normally due to lack of blood flow. Encouraged her and her family to continue monitoring for signs of infection.

## 2016-10-05 NOTE — Assessment & Plan Note (Signed)
BP 109/66 today.  Plan --Continue metoprolol 50 mg daily

## 2016-10-05 NOTE — Assessment & Plan Note (Signed)
As this was my first time meeting patient and her family, I discussed her code status again - she and daughter re-affirmed DNR status and stated they have her DNR form at home.   I placed DRN order on her chart; there is also a copy of her DRN form on file.

## 2016-10-05 NOTE — Progress Notes (Signed)
   CC: Toe pain  HPI:  [name] is a 83 y.o. with a PMH of paroxysmal atrial fibrillation not on anticoagulation, systolic heart failure (EF 15%, no ICD), hypertension and hyperlipidemia presenting to clinic for follow-up of her chronic conditions and toe pain.  Patient states that her left lateral great toe continues to give her pain on and off. Patient denies redness, warmth, drainage from site. There is skin breakdown, which patient states has been there for over a year.  Please see problem based Assessment and Plan for status of patients chronic conditions.  Past Medical History:  Diagnosis Date  . Atrial fibrillation (HCC)    on coumadin since 2012, recently stopped because of vaginal bleeding  . CHF (congestive heart failure) (HCC)   . Hyperlipidemia   . Hypertension   . Routine adult health maintenance 04/09/2012    Review of Systems:   Review of Systems  Constitutional: Positive for weight loss. Negative for chills, fever and malaise/fatigue.  HENT: Negative for congestion and hearing loss.        Postnasal drainage  Eyes: Negative for blurred vision and double vision.  Respiratory: Negative for cough, sputum production and shortness of breath.   Cardiovascular: Positive for leg swelling. Negative for chest pain, palpitations and orthopnea.  Gastrointestinal: Negative for abdominal pain, blood in stool, constipation, diarrhea, melena, nausea and vomiting.  Genitourinary: Positive for frequency. Negative for hematuria.  Musculoskeletal:       Left foot pain  Skin: Negative for rash.  Neurological: Negative for dizziness, focal weakness, loss of consciousness and headaches.  Psychiatric/Behavioral: The patient has insomnia (p patient's sleep-wake cycle is reversed).     Physical Exam:  Vitals:   10/05/16 1439  BP: 109/66  Pulse: 72  Temp: 97.8 F (36.6 C)  TempSrc: Oral  Weight: 143 lb 9.6 oz (65.1 kg)  Height: 5\' 6"  (1.676 m)   GENERAL- alert,  co-operative, appears as stated age, not in any distress. HEENT- Atraumatic, normocephalic, EOMI, oral mucosa appears moist, no oropharyngeal erythema or exudates CARDIAC- RRR, 4/6 systolic murmur, no rubs or gallops. 2+ pitting edema in BLEs to knees RESP- Moving equal volumes of air, and clear to auscultation bilaterally, no wheezes or crackles. ABDOMEN- Soft, nontender, bowel sounds present. NEURO- No obvious Cr N abnormality. EXTREMITIES- radial pulse 2+ bilaterally; PT and DP pulses not palpable; her feet are warm to touch; she has a small ulcer on left MCP joint overlying bunion and small ulcer on right lateral small toe - ulcers are not tender to palpation, do not have increased warmth, are without induration or drainage. Left great toe nail disfigured but without evidence of infection underneath. SKIN- Warm, dry. Ulcers as above. PSYCH- Normal mood and affect, appropriate thought content and speech.  Assessment & Plan:   See Encounters Tab for problem based charting.   Patient discussed with Dr. Valla Leaver, MD Internal Medicine PGY2

## 2016-10-05 NOTE — Assessment & Plan Note (Addendum)
Patient stable on metoprolol 50 mg daily. Her heart rate is regular today. She is only on ASA 81mg  daily per preference.

## 2016-10-05 NOTE — Patient Instructions (Signed)
I will place an order for wound care to come to your home to help with your feet.   Please discuss with your family about whether or not you want to visit a vascular specialist for your feet as they are not getting good blood flow. I will call you next week about it.  I am checking your blood work for your electrolytes today and will let you know if your potassium dose needs to be changed.  It was nice meeting you!

## 2016-10-06 LAB — BMP8+ANION GAP
ANION GAP: 9 mmol/L — AB (ref 10.0–18.0)
BUN / CREAT RATIO: 24 (ref 12–28)
BUN: 23 mg/dL (ref 8–27)
CHLORIDE: 99 mmol/L (ref 96–106)
CO2: 33 mmol/L — AB (ref 20–29)
Calcium: 8.7 mg/dL (ref 8.7–10.3)
Creatinine, Ser: 0.95 mg/dL (ref 0.57–1.00)
GFR calc Af Amer: 62 mL/min/{1.73_m2} (ref >59)
GFR calc non Af Amer: 54 mL/min/{1.73_m2} — ABNORMAL LOW (ref >59)
GLUCOSE: 132 mg/dL — AB (ref 65–99)
POTASSIUM: 4.4 mmol/L (ref 3.5–5.2)
Sodium: 141 mmol/L (ref 134–144)

## 2016-10-07 NOTE — Progress Notes (Signed)
Internal Medicine Clinic Attending  Case discussed with Dr. Svalina  at the time of the visit.  We reviewed the resident's history and exam and pertinent patient test results.  I agree with the assessment, diagnosis, and plan of care documented in the resident's note.  

## 2016-10-11 ENCOUNTER — Telehealth: Payer: Self-pay | Admitting: Internal Medicine

## 2016-10-11 NOTE — Telephone Encounter (Signed)
I called the patient this morning to discuss her decision on pursuing a vasc surgery referral for her likely PAD. Patient states that she has not had a chance to discuss with her family; she asked that I discuss the options with her son, Alger Simons, over the phone so that he may more accurately convey the information to the rest of the family. I advised the patient again of the options (wound care alone with monitoring which would put her at an increased risk of infection and possible amputation down the road due to decreased blood flow vs referral to vasc surgery for possible revascularization if able). I discussed these options with her son Alger Simons. He states that the patient and family will discuss these options and call the clinic with their decision in the next day or so. I let them know that I will call them if I don't hear from them. I advised them that if revascularization was the goal, the sooner the decision was made the better.   Patient and son stated appreciation at discussion of options again.  Nyra Market, MD IMTS - PGY2 Pager [phone]

## 2016-10-12 ENCOUNTER — Telehealth: Payer: Self-pay

## 2016-10-12 DIAGNOSIS — I739 Peripheral vascular disease, unspecified: Secondary | ICD-10-CM

## 2016-10-12 DIAGNOSIS — L97519 Non-pressure chronic ulcer of other part of right foot with unspecified severity: Secondary | ICD-10-CM

## 2016-10-12 DIAGNOSIS — L97529 Non-pressure chronic ulcer of other part of left foot with unspecified severity: Secondary | ICD-10-CM

## 2016-10-12 MED FILL — POTASSIUM CL ER 20 MEQ TABL: 20 | 45 days supply | Qty: 90 | Fill #5

## 2016-10-12 NOTE — Telephone Encounter (Signed)
Spoke with patient's son, Alger Simons who states that patient has decided to pursue referral to vascular surgery for possible revascularization in setting of likely PAD. I have placed the referral.  Nyra Market, MD IMTS - PGY2 Pager [phone]

## 2016-10-12 NOTE — Telephone Encounter (Signed)
Asking to speak with a nurse. Please call back.  

## 2016-10-12 NOTE — Telephone Encounter (Signed)
Pt's son calls and states that the family has talked w/ pt and decided to go with surgery- vascular- ph. [phone], son is Aeronautical engineer

## 2016-10-17 ENCOUNTER — Encounter: Payer: Self-pay | Admitting: Vascular Surgery

## 2016-10-18 ENCOUNTER — Other Ambulatory Visit: Payer: Self-pay

## 2016-10-18 DIAGNOSIS — I739 Peripheral vascular disease, unspecified: Secondary | ICD-10-CM

## 2016-10-19 ENCOUNTER — Encounter: Payer: Self-pay | Admitting: Vascular Surgery

## 2016-10-19 ENCOUNTER — Other Ambulatory Visit: Payer: Self-pay | Admitting: *Deleted

## 2016-10-19 ENCOUNTER — Encounter: Payer: Self-pay | Admitting: *Deleted

## 2016-10-19 ENCOUNTER — Ambulatory Visit (HOSPITAL_COMMUNITY)
Admission: RE | Admit: 2016-10-19 | Discharge: 2016-10-19 | Disposition: A | Discharge status: Home / Self Care | Payer: Medicare Other | Source: Ambulatory Visit | Attending: Vascular Surgery | Admitting: Vascular Surgery

## 2016-10-19 ENCOUNTER — Ambulatory Visit (INDEPENDENT_AMBULATORY_CARE_PROVIDER_SITE_OTHER): Payer: Medicare Other | Admitting: Vascular Surgery

## 2016-10-19 VITALS — BP 105/61 | HR 64 | Temp 97.1°F | Resp 16 | Ht 63.0 in | Wt 140.0 lb

## 2016-10-19 DIAGNOSIS — I739 Peripheral vascular disease, unspecified: Secondary | ICD-10-CM

## 2016-10-19 DIAGNOSIS — I70203 Unspecified atherosclerosis of native arteries of extremities, bilateral legs: Secondary | ICD-10-CM | POA: Insufficient documentation

## 2016-10-19 NOTE — Progress Notes (Signed)
Patient ID: Jean Moore, female   DOB: Mar 12, 1926, 83 y.o.   MRN: [id number]  Reason for Consult: New Patient (Initial Visit)   Referred by Nyra Market, MD  Subjective:     HPI:  Jean Moore is a 83 y.o. female with history of atrial fibrillation although Coumadin was recently stopped she also has CHF hypercholesterolemia hypertension. She is nondiabetic and her kidneys functioned normally. She has a several month history of left toe ulceration with painand associated bunion. She did have some work on this toe by her podiatrist now is using only Epsom salts and soaking and has not had any infection. She denies any fevers. She does walk but uses a wheelchair and walker to also get around. She does take aspirin and statin currently no blood thinners.  Past Medical History:  Diagnosis Date  . Atrial fibrillation (HCC)    on coumadin since 2012, recently stopped because of vaginal bleeding  . CHF (congestive heart failure) (HCC)   . Hyperlipidemia   . Hypertension   . Routine adult health maintenance 04/09/2012   Family History  Problem Relation Age of Onset  . Hypertension Mother   . Hypertension Father   . Thyroid disease Neg Hx    Past Surgical History:  Procedure Laterality Date  . NO PAST SURGERIES      Short Social History:  Social History  Substance Use Topics  . Smoking status: Never Smoker  . Smokeless tobacco: Never Used  . Alcohol use No    Allergies  Allergen Reactions  . Shrimp [Shellfish Allergy] Other (See Comments)    Reaction: Hives, Itching, Bumps, Swelling    Current Outpatient Prescriptions  Medication Sig Dispense Refill  . acetaminophen (TYLENOL) 500 MG tablet Take 500 mg by mouth every 6 (six) hours as needed for pain.    Marland Kitchen aspirin 81 MG tablet Take 1 tablet (81 mg total) by mouth daily. 120 tablet 5  . atorvastatin (LIPITOR) 40 MG tablet TAKE 1 TABLET BY MOUTH DAILY AT 6 PM. 90 tablet PRN  . COMBIGAN 0.2-0.5 % ophthalmic solution Place 1  drop into both eyes 2 (two) times daily.  4  . digoxin (LANOXIN) 0.125 MG tablet Take 1 tablet (0.125 mg total) by mouth daily. 30 tablet 11  . dorzolamide (TRUSOPT) 2 % ophthalmic solution Place 1 drop into both eyes 2 (two) times daily.  4  . furosemide (LASIX) 40 MG tablet Take 2 pills (80 mg) in the morning, and 1 pill (40 mg) in the afternoon. 180 tablet 0  . latanoprost (XALATAN) 0.005 % ophthalmic solution Place 1 drop into both eyes at bedtime.  4  . metoprolol succinate (TOPROL-XL) 50 MG 24 hr tablet Take 1 tablet (50 mg total) by mouth daily. Take with or immediately following a meal. 90 tablet 0  . Multiple Vitamins-Minerals (CENTRUM SILVER ADULT 50+ PO) Take 1 tablet by mouth daily.    . potassium chloride SA (KLOR-CON M20) 20 MEQ tablet Take 1 tablet (20 mEq total) by mouth 2 (two) times daily. 90 tablet PRN   No current facility-administered medications for this visit.     Review of Systems  Respiratory: Positive for shortness of breath and wheezing.  Cardiovascular: Positive for dyspnea with exertion.  Neurological: Positive for focal weakness.  Psychiatric: Psychiatric negative.        Objective:  Objective   Vitals:   10/19/16 1436  BP: 105/61  Pulse: 64  Resp: 16  Temp: (!) 97.1 F (36.2  C)  SpO2: 98%  Weight: 140 lb (63.5 kg)  Height: 5\' 3"  (1.6 m)   Body mass index is 24.8 kg/m.  Physical Exam  Constitutional: She is oriented to person, place, and time. She appears well-developed.  HENT:  Head: Normocephalic.  Eyes: Pupils are equal, round, and reactive to light.  Neck: Normal range of motion.  Cardiovascular:  Pulses:      Radial pulses are 2+ on the right side, and 2+ on the left side.       Femoral pulses are 2+ on the right side, and 2+ on the left side.      Popliteal pulses are 0 on the right side, and 0 on the left side.       Dorsalis pedis pulses are 0 on the right side, and 0 on the left side.       Posterior tibial pulses are 0 on the  right side, and 0 on the left side.  Pulmonary/Chest: Effort normal.  Abdominal: Soft. She exhibits no mass.  Musculoskeletal: Normal range of motion.  Neurological: She is alert and oriented to person, place, and time.  Skin:  Skin of all left toes is darkened. Superficial ulceration of left 1st metatarsal bunion  Psychiatric: She has a normal mood and affect. Her behavior is normal. Judgment and thought content normal.    Data: I have independently interpreted her ABIs to be 0.85 on the right and 0.50 on the left. Her left digital pressure is 46. All tibial signals are monophasic.  Assessment/Plan:    83 year old female with left great toe discoloration and ulceration of a bunion. She has a monophasic signals and severely depressed ABIs and toe pressures on the left. She has palpable femoral pulses I cannot palpate popliteals on either side all signals are monophasic. The standpoint I have advised him to proceed with angiogram possible left lower extremity intervention. We have discussed risks benefits and alternatives. She will remain on aspirin and statin and if we intervene she will possibly need Plavix. She demonstrated good understanding and we will get her scheduled today.     Maeola Harman MD [facility]

## 2016-10-20 MED FILL — METOPROLOL SUCC ER 100 MG T: 100 | 90 days supply | Qty: 90 | Fill #2

## 2016-10-20 MED FILL — COMBIGAN EYE DROPS: 0.2-0.5 | 25 days supply | Qty: 5 | Fill #7

## 2016-10-25 ENCOUNTER — Telehealth: Payer: Self-pay | Admitting: *Deleted

## 2016-10-25 NOTE — Telephone Encounter (Signed)
Have called [facility] she states she will speak to the daughter and instruct her and pt

## 2016-10-25 NOTE — Telephone Encounter (Signed)
Patient may use melatonin 1mg  over the counter, half a pill at night for sleep if the patient is concerned about her sleep. Please let patient know this may increase her sedation and increase risk of falls.  Nyra Market, MD IMTS - PGY2 Pager [phone]

## 2016-10-25 NOTE — Telephone Encounter (Signed)
[facility] calls and ask if pt could use some melatonin for sleep [phone], stephanie, please advise

## 2016-10-26 ENCOUNTER — Ambulatory Visit (HOSPITAL_COMMUNITY)
Admission: RE | Admit: 2016-10-26 | Discharge: 2016-10-26 | Disposition: A | Discharge status: Home / Self Care | Payer: Medicare Other | Source: Ambulatory Visit | Attending: Vascular Surgery | Admitting: Vascular Surgery

## 2016-10-26 ENCOUNTER — Encounter (HOSPITAL_COMMUNITY): Payer: Self-pay | Admitting: Vascular Surgery

## 2016-10-26 ENCOUNTER — Encounter (HOSPITAL_COMMUNITY)
Admission: RE | Disposition: A | Discharge status: Home / Self Care | Payer: Self-pay | Source: Ambulatory Visit | Attending: Vascular Surgery

## 2016-10-26 ENCOUNTER — Telehealth: Payer: Self-pay | Admitting: Vascular Surgery

## 2016-10-26 DIAGNOSIS — I998 Other disorder of circulatory system: Secondary | ICD-10-CM | POA: Diagnosis not present

## 2016-10-26 DIAGNOSIS — I70245 Atherosclerosis of native arteries of left leg with ulceration of other part of foot: Secondary | ICD-10-CM | POA: Insufficient documentation

## 2016-10-26 DIAGNOSIS — I739 Peripheral vascular disease, unspecified: Secondary | ICD-10-CM | POA: Diagnosis present

## 2016-10-26 DIAGNOSIS — L97529 Non-pressure chronic ulcer of other part of left foot with unspecified severity: Secondary | ICD-10-CM | POA: Diagnosis not present

## 2016-10-26 HISTORY — PX: ABDOMINAL AORTOGRAM W/LOWER EXTREMITY: CATH118223

## 2016-10-26 LAB — POCT I-STAT, CHEM 8
BUN: 26 mg/dL — AB (ref 6–20)
Calcium, Ion: 1.1 mmol/L — ABNORMAL LOW (ref 1.15–1.40)
Chloride: 100 mmol/L — ABNORMAL LOW (ref 101–111)
Creatinine, Ser: 0.9 mg/dL (ref 0.44–1.00)
Glucose, Bld: 173 mg/dL — ABNORMAL HIGH (ref 65–99)
HEMATOCRIT: 39 % (ref 36.0–46.0)
HEMOGLOBIN: 13.3 g/dL (ref 12.0–15.0)
POTASSIUM: 3.9 mmol/L (ref 3.5–5.1)
SODIUM: 141 mmol/L (ref 135–145)
TCO2: 31 mmol/L (ref 22–32)

## 2016-10-26 LAB — POCT ACTIVATED CLOTTING TIME: Activated Clotting Time: 180 seconds

## 2016-10-26 SURGERY — ABDOMINAL AORTOGRAM W/LOWER EXTREMITY
Anesthesia: LOCAL

## 2016-10-26 MED ORDER — LABETALOL HCL 5 MG/ML IV SOLN: 10.0000 mg | INTRAVENOUS | Status: DC | PRN

## 2016-10-26 MED ORDER — SODIUM CHLORIDE 0.9% FLUSH: 3.0000 mL | Freq: Two times a day (BID) | INTRAVENOUS | Status: DC

## 2016-10-26 MED ORDER — FENTANYL CITRATE (PF) 100 MCG/2ML IJ SOLN
INTRAMUSCULAR | Status: DC | PRN
  Administered 2016-10-26: 50 ug via INTRAVENOUS

## 2016-10-26 MED ORDER — SODIUM CHLORIDE 0.9 % WEIGHT BASED INFUSION: 1.0000 mL/kg/h | INTRAVENOUS | Status: DC

## 2016-10-26 MED ORDER — HEPARIN (PORCINE) IN NACL 2-0.9 UNIT/ML-% IJ SOLN
INTRAMUSCULAR | Status: AC
  Filled 2016-10-26: qty 1000

## 2016-10-26 MED ORDER — SODIUM CHLORIDE 0.9% FLUSH: 3.0000 mL | INTRAVENOUS | Status: DC | PRN

## 2016-10-26 MED ORDER — LIDOCAINE HCL (PF) 1 % IJ SOLN
INTRAMUSCULAR | Status: DC | PRN
  Administered 2016-10-26: 10 mL

## 2016-10-26 MED ORDER — HEPARIN (PORCINE) IN NACL 2-0.9 UNIT/ML-% IJ SOLN
INTRAMUSCULAR | Status: AC | PRN
  Administered 2016-10-26: 1000 mL via INTRA_ARTERIAL

## 2016-10-26 MED ORDER — FENTANYL CITRATE (PF) 100 MCG/2ML IJ SOLN
INTRAMUSCULAR | Status: AC
  Filled 2016-10-26: qty 2

## 2016-10-26 MED ORDER — IODIXANOL 320 MG/ML IV SOLN
INTRAVENOUS | Status: DC | PRN
  Administered 2016-10-26: 95 mL via INTRA_ARTERIAL

## 2016-10-26 MED ORDER — SODIUM CHLORIDE 0.9 % IV SOLN: 250.0000 mL | INTRAVENOUS | Status: DC | PRN

## 2016-10-26 MED ORDER — HYDRALAZINE HCL 20 MG/ML IJ SOLN: 5.0000 mg | INTRAMUSCULAR | Status: DC | PRN

## 2016-10-26 MED ORDER — SODIUM CHLORIDE 0.9 % IV SOLN
INTRAVENOUS | Status: DC
  Administered 2016-10-26: 07:00:00 via INTRAVENOUS

## 2016-10-26 MED ORDER — OXYCODONE HCL 5 MG PO TABS: 5.0000 mg | ORAL_TABLET | ORAL | Status: DC | PRN

## 2016-10-26 MED ORDER — HEPARIN SODIUM (PORCINE) 1000 UNIT/ML IJ SOLN
INTRAMUSCULAR | Status: DC | PRN
  Administered 2016-10-26: 3000 [IU] via INTRAVENOUS

## 2016-10-26 MED ORDER — LIDOCAINE HCL 2 % IJ SOLN
INTRAMUSCULAR | Status: AC
  Filled 2016-10-26: qty 10

## 2016-10-26 MED ORDER — ONDANSETRON HCL 4 MG/2ML IJ SOLN
INTRAMUSCULAR | Status: DC | PRN
  Administered 2016-10-26: 4 mg via INTRAVENOUS

## 2016-10-26 MED FILL — LATANOPROST 0.005% EYE DRP: 0.005 | 25 days supply | Qty: 3 | Fill #6

## 2016-10-26 SURGICAL SUPPLY — 13 items
CATH CXI SUPP ST 2.6FR 150CM (CATHETERS) ×2 IMPLANT
CATH OMNI FLUSH 5F 65CM (CATHETERS) ×2 IMPLANT
CATH TEMPO AQUA 5F 100CM (CATHETERS) ×4 IMPLANT
COVER PRB 48X5XTLSCP FOLD TPE (BAG) ×1 IMPLANT
COVER PROBE 5X48 (BAG) ×1
KIT MICROINTRODUCER STIFF 5F (SHEATH) ×2 IMPLANT
KIT PV (KITS) ×2 IMPLANT
SHEATH PINNACLE 5F 10CM (SHEATH) ×2 IMPLANT
SYR MEDRAD MARK V 150ML (SYRINGE) ×2 IMPLANT
TRANSDUCER W/STOPCOCK (MISCELLANEOUS) ×2 IMPLANT
TRAY PV CATH (CUSTOM PROCEDURE TRAY) ×2 IMPLANT
WIRE BENTSON .035X145CM (WIRE) ×2 IMPLANT
WIRE G V18X300CM (WIRE) ×2 IMPLANT

## 2016-10-26 NOTE — Telephone Encounter (Signed)
-----   Message from Sharee Pimple, RN sent at 10/26/2016 12:48 PM EDT ----- Regarding: 2-3 weeks    ----- Message ----- From: Maeola Harman, MD Sent: 10/26/2016  12:19 PM To: [facility] [address]  Jean Moore [id number] 05/03/1926  10/26/2016 Pre-operative Diagnosis: critical left lower extremity ischemia  Surgeon:  Luanna Salk. Randie Heinz, MD  Procedure Performed: 1.  US guided cannulation of right common femoral artery 2.  Aortogram with left lower extremity angiogram 3.  Selective cannulation of left peroneal artery  F/u in 2-3 weeks for wound check. Can be with NP on a Friday when I am there if needed.

## 2016-10-26 NOTE — H&P (Signed)
   History and Physical Update  The patient was interviewed and re-examined.  The patient's previous History and Physical has been reviewed and is unchanged from recent office visit.   Brandon C. Randie Heinz, MD [facility] [place] Office: [phone] Pager: [phone]  10/26/2016, 7:47 AM

## 2016-10-26 NOTE — Discharge Instructions (Signed)

## 2016-10-26 NOTE — Progress Notes (Signed)
Site area: rt groin fa sheath Site Prior to Removal:  Level 0 Pressure Applied For: 20 minutes Manual:   yes Patient Status During Pull:  stable Post Pull Site:  Level 0 Post Pull Instructions Given:  yes Post Pull Pulses Present: dopplered Dressing Applied:  Gauze and tegderm Bedrest begins @ 1035 Comments:

## 2016-10-26 NOTE — Op Note (Signed)
    Patient name: Jean Moore MRN: [id number] DOB: 1926-04-24 Sex: female  10/26/2016 Pre-operative Diagnosis: critical left lower extremity ischemia Post-operative diagnosis:  Same Surgeon:  Apolinar Junes C. Randie Heinz, MD Procedure Performed: 1.  US guided cannulation of right common femoral artery 2.  Aortogram with left lower extremity angiogram 3.  Selective cannulation of left peroneal artery  Indications:  83 year old female with history of left great toe ulceration now indicated for angiogram with possible intervention.  Findings: Aorta iliac and femoral systems all have significant non-flow-limiting calcifications. She does have severe slowing of contrast suggestive of low ejection fraction. The SFA has multiple levels of 30% stenosis that is not flow-limiting as does the popliteal with an area of 50% stenosis. The below-knee popliteal artery occludes and reconstitutes a distal peroneal artery as well as a dorsalis pedis artery. We did attempt across the peroneal artery but were unable to.  Patient will need continued wound care with possible consideration of retrograde cannulation if she cannot heal her toe wound.   Procedure:  The patient was identified in the holding area and taken to room 8.  The patient was then placed supine on the table and prepped and draped in the usual sterile fashion.  A time out was called.  Ultrasound was used to evaluate the right common femoral artery.  It was patent .  A digital ultrasound image was acquired.  A micropuncture needle was used to access the right common femoral artery under ultrasound guidance.  An 018 wire was advanced without resistance and a micropuncture sheath was placed.  The 018 wire was removed and a benson wire was placed.  The micropuncture sheath was exchanged for a 5 french sheath.  An omniflush catheter was advanced over the wire to the level of L-1.  An abdominal angiogram was obtained.  Next, using the omniflush catheter and a benson wire,  the aortic bifurcation was crossed and the catheter was placed into theleft external iliac artery and left runoff was obtained. With the findings above we then used a V 18 wire and straight catheter to the popliteal artery and performed further injections to identify the dorsalis pedis distally as well as peroneal artery distally. We then used a V 18 wire and CXI catheter to attempt across the pain artery. Patient was given 3000 of heparin prior to this. Unfortunately we were unable to cross antegrade. She will need continued wound care with possibility of retrograde access which would be very difficult but would be last-ditch effort. Patient tolerated procedure well without immediate complication.  Contrast: 95cc  Kaimana Neuzil C. Randie Heinz, MD [facility] [place] Office: [phone] Pager: [phone]

## 2016-10-26 NOTE — Telephone Encounter (Signed)
Sched appt 11/11/16 at 11:15. Lm on hm#.

## 2016-11-09 ENCOUNTER — Encounter: Payer: Self-pay | Admitting: Family

## 2016-11-10 ENCOUNTER — Other Ambulatory Visit (HOSPITAL_COMMUNITY): Payer: Self-pay | Admitting: Internal Medicine

## 2016-11-10 MED FILL — DIGOXIN 0.125 MG TABLET: 125 | 90 days supply | Qty: 90 | Fill #2

## 2016-11-11 ENCOUNTER — Encounter: Payer: Self-pay | Admitting: Family

## 2016-11-11 ENCOUNTER — Encounter: Payer: Self-pay | Admitting: *Deleted

## 2016-11-11 ENCOUNTER — Other Ambulatory Visit: Payer: Self-pay | Admitting: *Deleted

## 2016-11-11 ENCOUNTER — Ambulatory Visit (INDEPENDENT_AMBULATORY_CARE_PROVIDER_SITE_OTHER): Payer: Self-pay | Admitting: Family

## 2016-11-11 VITALS — BP 100/66 | HR 73 | Temp 98.2°F | Resp 16 | Ht 63.0 in | Wt 150.0 lb

## 2016-11-11 DIAGNOSIS — I779 Disorder of arteries and arterioles, unspecified: Secondary | ICD-10-CM

## 2016-11-11 DIAGNOSIS — I998 Other disorder of circulatory system: Secondary | ICD-10-CM

## 2016-11-11 DIAGNOSIS — I70229 Atherosclerosis of native arteries of extremities with rest pain, unspecified extremity: Secondary | ICD-10-CM

## 2016-11-11 NOTE — Progress Notes (Signed)
Postoperative Visit   History of Present Illness  Jean Moore is a 83 y.o. female who is s/p US guided cannulation of right common femoral artery, Aortogram with left lower extremity angiogram, and Selective cannulation of left peroneal artery on 10-26-16 by Dr. Randie Heinz for critical left lower extremity ischemia,  history of left great toe ulceration.  Findings: Aorta iliac and femoral systems all have significant non-flow-limiting calcifications. She does have severe slowing of contrast suggestive of low ejection fraction. The SFA has multiple levels of 30% stenosis that is not flow-limiting as does the popliteal with an area of 50% stenosis. The below-knee popliteal artery occludes and reconstitutes a distal peroneal artery as well as a dorsalis pedis artery. We did attempt across the peroneal artery but were unable to. Patient will need continued wound care with possible consideration of retrograde cannulation if she cannot heal her toe wound.  Left bunion has a healed lesion and left great toe has a thick mycotic nail, but no ulcers.  The patient is able to complete their activities of daily living.  Her son and daughter are here with her.   For VQI Use Only  PRE-ADM LIVING: Home  AMB STATUS: Ambulatory   Past Medical History:  Diagnosis Date  . Atrial fibrillation (HCC)    on coumadin since 2012, recently stopped because of vaginal bleeding  . CHF (congestive heart failure) (HCC)   . Hyperlipidemia   . Hypertension   . Routine adult health maintenance 04/09/2012    Past Surgical History:  Procedure Laterality Date  . ABDOMINAL AORTOGRAM W/LOWER EXTREMITY N/A 10/26/2016   Procedure: ABDOMINAL AORTOGRAM W/LOWER EXTREMITY;  Surgeon: Maeola Harman, MD;  Location: [facility]IVE [facility];  Service: Cardiovascular;  Laterality: N/A;  . NO PAST SURGERIES      Social History   Social History  . Marital status: Widowed    Spouse name: N/A  . Number of children: N/A  .  Years of education: N/A   Occupational History  . Not on file.   Social History Main Topics  . Smoking status: Never Smoker  . Smokeless tobacco: Never Used  . Alcohol use No  . Drug use: No  . Sexual activity: Not on file   Other Topics Concern  . Not on file   Social History Narrative  . No narrative on file    Allergies  Allergen Reactions  . Shrimp [Shellfish Allergy] Other (See Comments)    Reaction: Hives, Itching, Bumps, Swelling    Current Outpatient Prescriptions on File Prior to Visit  Medication Sig Dispense Refill  . acetaminophen (TYLENOL) 500 MG tablet Take 500 mg by mouth every 6 (six) hours as needed for pain.    Marland Kitchen aspirin 81 MG tablet Take 1 tablet (81 mg total) by mouth daily. 120 tablet 5  . atorvastatin (LIPITOR) 40 MG tablet TAKE 1 TABLET BY MOUTH DAILY AT 6 PM. 90 tablet PRN  . COMBIGAN 0.2-0.5 % ophthalmic solution Place 1 drop into both eyes 2 (two) times daily.  4  . digoxin (LANOXIN) 0.125 MG tablet Take 1 tablet (0.125 mg total) by mouth daily. 30 tablet 11  . dorzolamide (TRUSOPT) 2 % ophthalmic solution Place 1 drop into both eyes 2 (two) times daily.  4  . furosemide (LASIX) 40 MG tablet Take 2 pills (80 mg) in the morning, and 1 pill (40 mg) in the afternoon. 180 tablet 0  . latanoprost (XALATAN) 0.005 % ophthalmic solution Place 1 drop into  both eyes at bedtime.  4  . metoprolol succinate (TOPROL-XL) 50 MG 24 hr tablet Take 1 tablet (50 mg total) by mouth daily. Take with or immediately following a meal. 90 tablet 0  . OVER THE COUNTER MEDICATION Place 1 drop into both eyes 2 (two) times daily before lunch and supper. Smooth XP eye drop    . Polyethyl Glycol-Propyl Glycol (SYSTANE ULTRA OP) Place 1 drop into both eyes 4 (four) times daily as needed (dry eyes).    . potassium chloride SA (KLOR-CON M20) 20 MEQ tablet Take 1 tablet (20 mEq total) by mouth 2 (two) times daily. 90 tablet PRN   No current facility-administered medications on file  prior to visit.       Physical Examination  Vitals:   11/11/16 1122  BP: 100/66  Pulse: 73  Resp: 16  Temp: 98.2 F (36.8 C)  TempSrc: Oral  SpO2: 96%  Weight: 150 lb (68 kg)  Height:  (1.6 m)   Body mass index is 26.57 kg/m.  PHYSICAL EXAMINATION: General: The patient appears their stated age.   HEENT:  No gross abnormalities Pulmonary: Respirations are non-labored Abdomen: Soft and non-tender. Musculoskeletal: There are no major deformities.   Neurologic: No focal weakness or paresthesias are detected, pt is hard of hearing.  Skin: There are no rashes noted. Left great toe and left foot bunion ulcer are healed.  Psychiatric: The patient has normal affect. Cardiovascular: There is an irregular rhythm, rate at 73  Vascular: Vessel Right Left  Radial Palpable Palpable  Brachial Palpable Palpable  Carotid Palpable Palpable  Aorta Not palpable N/A  Femoral Palpable Palpable  Popliteal Not palpable Not palpable  PT not Palpable, no Doppler signal not Palpable, + Doppler signal  DP not Palpable, + Doppler signal not Palpable, + Doppler signal   Left foot    Both feet    Medical Decision Making  Jean Moore is a 83 y.o. female who presents s/p Aortogram with left lower extremity angiogram, and Selective cannulation of left peroneal artery on 10-26-16. Dr. Randie Heinz spoke with pt, daughter, and son, and examined pt.  Pt will be scheduled for retrograde arteriogram of left lower leg by Dr. Randie Heinz.    Thank you for allowing [place] to participate in this patient's care.  Davette Nugent, Carma Lair, RN, MSN, FNP-C [facility] [place] Office: [phone]  11/11/2016, 11:46 AM  Clinic MD: Randie Heinz

## 2016-11-11 NOTE — Patient Instructions (Signed)

## 2016-11-14 ENCOUNTER — Encounter (HOSPITAL_COMMUNITY)
Admission: RE | Disposition: A | Discharge status: Home / Self Care | Payer: Self-pay | Source: Ambulatory Visit | Attending: Vascular Surgery

## 2016-11-14 ENCOUNTER — Ambulatory Visit (HOSPITAL_COMMUNITY)
Admission: RE | Admit: 2016-11-14 | Discharge: 2016-11-14 | Disposition: A | Discharge status: Home / Self Care | Payer: Medicare Other | Source: Ambulatory Visit | Attending: Vascular Surgery | Admitting: Vascular Surgery

## 2016-11-14 DIAGNOSIS — I509 Heart failure, unspecified: Secondary | ICD-10-CM | POA: Diagnosis not present

## 2016-11-14 DIAGNOSIS — E785 Hyperlipidemia, unspecified: Secondary | ICD-10-CM | POA: Diagnosis not present

## 2016-11-14 DIAGNOSIS — Z79899 Other long term (current) drug therapy: Secondary | ICD-10-CM | POA: Diagnosis not present

## 2016-11-14 DIAGNOSIS — Z91013 Allergy to seafood: Secondary | ICD-10-CM | POA: Insufficient documentation

## 2016-11-14 DIAGNOSIS — I70222 Atherosclerosis of native arteries of extremities with rest pain, left leg: Secondary | ICD-10-CM | POA: Diagnosis present

## 2016-11-14 DIAGNOSIS — I70212 Atherosclerosis of native arteries of extremities with intermittent claudication, left leg: Secondary | ICD-10-CM | POA: Insufficient documentation

## 2016-11-14 DIAGNOSIS — I110 Hypertensive heart disease with heart failure: Secondary | ICD-10-CM | POA: Diagnosis not present

## 2016-11-14 DIAGNOSIS — Z7982 Long term (current) use of aspirin: Secondary | ICD-10-CM | POA: Insufficient documentation

## 2016-11-14 DIAGNOSIS — I4891 Unspecified atrial fibrillation: Secondary | ICD-10-CM | POA: Insufficient documentation

## 2016-11-14 DIAGNOSIS — I998 Other disorder of circulatory system: Secondary | ICD-10-CM | POA: Diagnosis not present

## 2016-11-14 HISTORY — PX: PERIPHERAL VASCULAR BALLOON ANGIOPLASTY: CATH118281

## 2016-11-14 LAB — POCT ACTIVATED CLOTTING TIME
ACTIVATED CLOTTING TIME: 235 s
ACTIVATED CLOTTING TIME: 191 s
ACTIVATED CLOTTING TIME: 180 s
Activated Clotting Time: 213 seconds

## 2016-11-14 LAB — POCT I-STAT, CHEM 8
BUN: 24 mg/dL — AB (ref 6–20)
CALCIUM ION: 1.2 mmol/L (ref 1.15–1.40)
CHLORIDE: 98 mmol/L — AB (ref 101–111)
Creatinine, Ser: 1 mg/dL (ref 0.44–1.00)
GLUCOSE: 122 mg/dL — AB (ref 65–99)
HCT: 40 % (ref 36.0–46.0)
Hemoglobin: 13.6 g/dL (ref 12.0–15.0)
Potassium: 4.4 mmol/L (ref 3.5–5.1)
Sodium: 140 mmol/L (ref 135–145)
TCO2: 31 mmol/L (ref 22–32)

## 2016-11-14 SURGERY — PERIPHERAL VASCULAR BALLOON ANGIOPLASTY
Anesthesia: LOCAL

## 2016-11-14 MED ORDER — HEPARIN (PORCINE) IN NACL 2-0.9 UNIT/ML-% IJ SOLN
INTRAMUSCULAR | Status: AC | PRN
  Administered 2016-11-14: 1000 mL via INTRA_ARTERIAL

## 2016-11-14 MED ORDER — HEPARIN SODIUM (PORCINE) 1000 UNIT/ML IJ SOLN
INTRAMUSCULAR | Status: AC
  Filled 2016-11-14: qty 1

## 2016-11-14 MED ORDER — SODIUM CHLORIDE 0.9 % IV SOLN: 250.0000 mL | INTRAVENOUS | Status: DC | PRN

## 2016-11-14 MED ORDER — LIDOCAINE HCL 2 % IJ SOLN
INTRAMUSCULAR | Status: AC
  Filled 2016-11-14: qty 10

## 2016-11-14 MED ORDER — IODIXANOL 320 MG/ML IV SOLN
INTRAVENOUS | Status: DC | PRN
  Administered 2016-11-14: 60 mL via INTRA_ARTERIAL

## 2016-11-14 MED ORDER — FAMOTIDINE IN NACL 20-0.9 MG/50ML-% IV SOLN
20.0000 mg | INTRAVENOUS | Status: AC
  Administered 2016-11-14: 20 mg via INTRAVENOUS

## 2016-11-14 MED ORDER — CLOPIDOGREL BISULFATE 300 MG PO TABS
ORAL_TABLET | ORAL | Status: AC
  Filled 2016-11-14: qty 1

## 2016-11-14 MED ORDER — METHYLPREDNISOLONE SODIUM SUCC 125 MG IJ SOLR
INTRAMUSCULAR | Status: AC
  Administered 2016-11-14: 125 mg via INTRAVENOUS
  Filled 2016-11-14: qty 2

## 2016-11-14 MED ORDER — MORPHINE SULFATE (PF) 10 MG/ML IV SOLN: 1.0000 mg | INTRAVENOUS | Status: DC | PRN

## 2016-11-14 MED ORDER — DIPHENHYDRAMINE HCL 50 MG/ML IJ SOLN
25.0000 mg | INTRAMUSCULAR | Status: AC
  Administered 2016-11-14: 25 mg via INTRAVENOUS

## 2016-11-14 MED ORDER — LIDOCAINE HCL (PF) 1 % IJ SOLN
INTRAMUSCULAR | Status: DC | PRN
  Administered 2016-11-14: 20 mL

## 2016-11-14 MED ORDER — OXYCODONE HCL 5 MG PO TABS: 5.0000 mg | ORAL_TABLET | ORAL | Status: DC | PRN

## 2016-11-14 MED ORDER — CLOPIDOGREL BISULFATE 75 MG PO TABS: 75.0000 mg | ORAL_TABLET | Freq: Every day | ORAL | Status: DC

## 2016-11-14 MED ORDER — SODIUM CHLORIDE 0.9% FLUSH: 3.0000 mL | INTRAVENOUS | Status: DC | PRN

## 2016-11-14 MED ORDER — SODIUM CHLORIDE 0.9 % WEIGHT BASED INFUSION: 1.0000 mL/kg/h | INTRAVENOUS | Status: DC

## 2016-11-14 MED ORDER — HEPARIN (PORCINE) IN NACL 2-0.9 UNIT/ML-% IJ SOLN
INTRAMUSCULAR | Status: AC
  Filled 2016-11-14: qty 500

## 2016-11-14 MED ORDER — HEPARIN SODIUM (PORCINE) 1000 UNIT/ML IJ SOLN
INTRAMUSCULAR | Status: DC | PRN
  Administered 2016-11-14: 3000 [IU] via INTRAVENOUS
  Administered 2016-11-14: 5000 [IU] via INTRAVENOUS
  Administered 2016-11-14: 3000 [IU] via INTRAVENOUS

## 2016-11-14 MED ORDER — LABETALOL HCL 5 MG/ML IV SOLN: 10.0000 mg | INTRAVENOUS | Status: DC | PRN

## 2016-11-14 MED ORDER — SODIUM CHLORIDE 0.9% FLUSH: 3.0000 mL | Freq: Two times a day (BID) | INTRAVENOUS | Status: DC

## 2016-11-14 MED ORDER — FENTANYL CITRATE (PF) 100 MCG/2ML IJ SOLN
INTRAMUSCULAR | Status: DC | PRN
  Administered 2016-11-14 (×2): 25 ug via INTRAVENOUS

## 2016-11-14 MED ORDER — HYDRALAZINE HCL 20 MG/ML IJ SOLN: 5.0000 mg | INTRAMUSCULAR | Status: DC | PRN

## 2016-11-14 MED ORDER — METHYLPREDNISOLONE SODIUM SUCC 125 MG IJ SOLR
125.0000 mg | INTRAMUSCULAR | Status: AC
  Administered 2016-11-14: 125 mg via INTRAVENOUS

## 2016-11-14 MED ORDER — CLOPIDOGREL BISULFATE 75 MG PO TABS
300.0000 mg | ORAL_TABLET | Freq: Once | ORAL | Status: AC
  Administered 2016-11-14: 300 mg via ORAL

## 2016-11-14 MED ORDER — NITROGLYCERIN 1 MG/10 ML FOR IR/CATH LAB
INTRA_ARTERIAL | Status: DC | PRN
  Administered 2016-11-14 (×2): 200 ug via INTRA_ARTERIAL

## 2016-11-14 MED ORDER — NITROGLYCERIN 1 MG/10 ML FOR IR/CATH LAB
INTRA_ARTERIAL | Status: AC
Start: 2016-11-14 — End: 2016-11-14
  Filled 2016-11-14: qty 10

## 2016-11-14 MED ORDER — FENTANYL CITRATE (PF) 100 MCG/2ML IJ SOLN
INTRAMUSCULAR | Status: AC
  Filled 2016-11-14: qty 2

## 2016-11-14 MED ORDER — FAMOTIDINE IN NACL 20-0.9 MG/50ML-% IV SOLN
INTRAVENOUS | Status: AC
  Administered 2016-11-14: 20 mg via INTRAVENOUS
  Filled 2016-11-14: qty 50

## 2016-11-14 MED ORDER — DIPHENHYDRAMINE HCL 50 MG/ML IJ SOLN
INTRAMUSCULAR | Status: AC
  Administered 2016-11-14: 25 mg via INTRAVENOUS
  Filled 2016-11-14: qty 1

## 2016-11-14 MED ORDER — SODIUM CHLORIDE 0.9 % IV SOLN
INTRAVENOUS | Status: DC
  Administered 2016-11-14: 10:00:00 via INTRAVENOUS

## 2016-11-14 MED ORDER — CLOPIDOGREL BISULFATE 75 MG PO TABS: 75.0000 mg | ORAL_TABLET | Freq: Every day | ORAL | 3 refills | Status: DC

## 2016-11-14 MED FILL — ATORVASTATIN 40 MG TABLET: 40 | 90 days supply | Qty: 90 | Fill #0

## 2016-11-14 SURGICAL SUPPLY — 29 items
BALLN ADVANCE 14LP 3X20X170 (BALLOONS) ×3
BALLN LUTONIX DCB 4X60X130 (BALLOONS) ×3
BALLN STERLING OTW 2X150X150 (BALLOONS) ×3
BALLOON ADVANCE 14LP 3X20X170 (BALLOONS) ×2 IMPLANT
BALLOON LUTONIX DCB 4X60X130 (BALLOONS) ×2 IMPLANT
BALLOON STERLING OTW 2X150X150 (BALLOONS) ×2 IMPLANT
CATH CROSS OVER TEMPO 5F (CATHETERS) ×3 IMPLANT
CATH CXI 2.6F 65 ST (CATHETERS) ×2
CATH QUICKCROSS .018X135CM (MICROCATHETER) ×6 IMPLANT
CATH QUICKCROSS .035X135CM (MICROCATHETER) ×3 IMPLANT
CATH QUICKCROSS SUPP .018X90CM (MICROCATHETER) ×3 IMPLANT
CATH SPRT STRG 65X2.6FR BRD (CATHETERS) ×4 IMPLANT
COVER PRB 48X5XTLSCP FOLD TPE (BAG) ×2 IMPLANT
COVER PROBE 5X48 (BAG) ×1
DEVICE ONE SNARE 10MM (MISCELLANEOUS) ×6 IMPLANT
DEVICE TORQUE .025-.038 (MISCELLANEOUS) ×3 IMPLANT
GLIDEWIRE ANGLED NITR .018X260 (WIRE) ×3 IMPLANT
GUIDEWIRE ANGLED .035X260CM (WIRE) ×3 IMPLANT
HEMOSTASIS PAD V PLUS (HEMOSTASIS) ×6 IMPLANT
KIT ENCORE 26 ADVANTAGE (KITS) ×3 IMPLANT
KIT PV (KITS) ×3 IMPLANT
SHEATH FLEX ANSEL ANG 6F 45CM (SHEATH) ×3 IMPLANT
SHEATH MICROPUNCTURE PEDAL 4FR (SHEATH) ×3 IMPLANT
SHEATH PINNACLE 5F 10CM (SHEATH) ×3 IMPLANT
TRANSDUCER W/STOPCOCK (MISCELLANEOUS) ×3 IMPLANT
TRAY PV CATH (CUSTOM PROCEDURE TRAY) ×3 IMPLANT
WIRE BENTSON .035X145CM (WIRE) ×3 IMPLANT
WIRE G V18X300CM (WIRE) ×9 IMPLANT
WIRE SPARTACORE .014X300CM (WIRE) ×3 IMPLANT

## 2016-11-14 NOTE — Progress Notes (Addendum)
Site area: RFA Site Prior to Removal:  Level o Pressure Applied For:25 min Manual:  yes  Patient Status During Pull:  stable Post Pull Site:  Level 0 Post Pull Instructions Given:   Post Pull Pulses Present: doppler Dressing Applied: tegaderm / pressure Bedrest begins @ 1730 till 2130 Comments:

## 2016-11-14 NOTE — Op Note (Signed)
Patient name: Jean Moore MRN: [id number] DOB: [birth year] Sex: female  11/14/2016 Pre-operative Diagnosis: critical left lower extremity ischemia Post-operative diagnosis:  Same Surgeon:  Apolinar Junes C. Randie Heinz, MD Procedure Performed: 1.  US guided cannulation of left PT artery 2.  US guided cannulation of Right common femoral artery 3.  Drug coated balloon angioplasty of left popliteal and tp trunk with 18mm balloon 4.  Plain balloon angioplasty of left PT with 48mm balloon 5.  Administration of nitroglycerin intravenously  Indications:  83yo female with a history of left foot ulceration and pain.  She has undergone angiogram with inability to cross a long segment occlusion in her peroneal artery. She is now indicated for retrograde access possible intervention.   Findings: PT artery was patent at the level of the ankle was easily cannulated retrograde angiogram demonstrated several filling collaterals. We are able to cross from above and below and created a through and through wire access via safari technique. Following balloon angioplasty initially there was runoff then following that we did have apparent dissection and use drug-coated balloon at the level of the TP trunk where we had reentered. We used nitroglycerin which improved outflow and then used small balloon low-pressure hopefully tacked the dissection and again use nitroglycerin which it was responsive to. There was minimal signal at the posterior tibial artery.    Unfortunately this level of the likelihood of remaining patent there are no further options for revascularization of her left lower extremity.    Procedure:  The patient was identified in the holding area and taken to room 8.  The patient was then placed supine on the table and prepped and draped in the usual sterile fashion.  A time out was called.  Ultrasound was used to evaluate the left posterior tibial artery which was then cannulated with micropuncture needle and  a pedal access sheath was placed and retrograde angiogram performed. Patient was then given 3000 units of heparin. We used V 18 wire and CXI catheter initially to try to cross the occluded PT artery in a retrograde fashion ultimately used a quick cross catheter as well as well as a 018 Glidewire.  We could not completely cross the stenosis had very difficult time exchanging wires and catheters and ultimately used ultrasound to cannulate the right common femoral artery with micropuncture set and then placed a short 5 French sheath. We used a crossover catheter and Bentson wire to cross the bifurcation and then placed a long 6 French sheath into the proximal SFA on the left and an additional 5000 units of heparin was given. We maintained heparinization throughout the time with a CT of 224. We then used a V 18 from above to down to the below-knee popliteal artery and ultimately V 18 from below. We're unable to snare the V 18 but we were able to snare a Glidewire from below and pulled this through and through. We then performed an initial balloon edge plasty with 2 mm balloon followed by 3 mm balloon at which completion there was minimal runoff likely consistent with spasm. We then gave nitroglycerin and did have some runoff but also had notable dissection at the TP trunk which we tacked with a 4 mm drug-coated balloon inflated for 2-1/2 minutes to profile. Again we had spasm which responded to another 200 g nitroglycerin. We used a low pressure balloon inflation of a 2 mm long balloon to attempt to tack any residual dissection. Unfortunately we cannot find any focal dissection  points and I did not want to stent her entire posterior tibial artery. We elected at this time. She did have a very weak monophasic posterior tibial signal at completion. The sheath was removed from the posterior tibial artery and pressure held for a few minutes until hemostasis obtained. The right femoral sheath was removed tract over the wire  to the external iliac artery on the right will be performed postoperatively. Patient did tolerate this procedure well without immediate complication.   Contrast: 60cc  Kateena Degroote C. Randie Heinz, MD [facility] Office: [phone] Pager: [phone]

## 2016-11-14 NOTE — Discharge Instructions (Signed)

## 2016-11-14 NOTE — Progress Notes (Signed)
Client's family in and client awakened. Client confused as to place and client's daughter concerned about taking client home with new confusion and paged Dr Imogene Burn on call for Dr Randie Heinz; when Dr Particia Jasper back, client alert and oriented x 3 and Dr Imogene Burn notified of confusion and then alert and oriented and no new orders noted and okay to d/c home

## 2016-11-14 NOTE — H&P (Signed)
H+P   History of Present Illness  Jean Moore is a 83 y.o. female who is s/p US guided cannulation of right common femoral artery, Aortogram with left lower extremity angiogram, and Selective cannulation of left peroneal artery on 10-26-16 by Dr. Randie Heinz for critical left lower extremity ischemia,  history of left great toe ulceration.  Findings: Aorta iliac and femoral systems allhave significant non-flow-limiting calcifications. She does have severe slowing of contrast suggestive of low ejection fraction. The SFA has multiple levels of 30% stenosis that is not flow-limiting as does the popliteal with an area of 50% stenosis. The below-knee popliteal artery occludes and reconstitutes a distal peroneal artery as well as a dorsalis pedis artery. We did attempt across the peroneal artery but were unable to. Patient will need continued wound care with possible consideration of retrograde cannulation if she cannot heal her toe wound.  Left bunion has a healed lesion and left great toe has a thick mycotic nail, but no ulcers.  The patient is able to complete their activities of daily living.  Her son and daughter are here with her.   For VQI Use Only  PRE-ADM LIVING: Home  AMB STATUS: Ambulatory       Past Medical History:  Diagnosis Date  . Atrial fibrillation (HCC)    on coumadin since 2012, recently stopped because of vaginal bleeding  . CHF (congestive heart failure) (HCC)   . Hyperlipidemia   . Hypertension   . Routine adult health maintenance 04/09/2012         Past Surgical History:  Procedure Laterality Date  . ABDOMINAL AORTOGRAM W/LOWER EXTREMITY N/A 10/26/2016   Procedure: ABDOMINAL AORTOGRAM W/LOWER EXTREMITY;  Surgeon: Maeola Harman, MD;  Location: [facility] INVASIVE CV LAB;  Service: Cardiovascular;  Laterality: N/A;  . NO PAST SURGERIES      Social History        Social History  . Marital status: Widowed    Spouse name: N/A  .  Number of children: N/A  . Years of education: N/A      Occupational History  . Not on file.       Social History Main Topics  . Smoking status: Never Smoker  . Smokeless tobacco: Never Used  . Alcohol use No  . Drug use: No  . Sexual activity: Not on file       Other Topics Concern  . Not on file      Social History Narrative  . No narrative on file         Allergies  Allergen Reactions  . Shrimp [Shellfish Allergy] Other (See Comments)    Reaction: Hives, Itching, Bumps, Swelling          Current Outpatient Prescriptions on File Prior to Visit  Medication Sig Dispense Refill  . acetaminophen (TYLENOL) 500 MG tablet Take 500 mg by mouth every 6 (six) hours as needed for pain.    Marland Kitchen aspirin 81 MG tablet Take 1 tablet (81 mg total) by mouth daily. 120 tablet 5  . atorvastatin (LIPITOR) 40 MG tablet TAKE 1 TABLET BY MOUTH DAILY AT 6 PM. 90 tablet PRN  . COMBIGAN 0.2-0.5 % ophthalmic solution Place 1 drop into both eyes 2 (two) times daily.  4  . digoxin (LANOXIN) 0.125 MG tablet Take 1 tablet (0.125 mg total) by mouth daily. 30 tablet 11  . dorzolamide (TRUSOPT) 2 % ophthalmic solution Place 1 drop into both eyes 2 (two) times daily.  4  .  furosemide (LASIX) 40 MG tablet Take 2 pills (80 mg) in the morning, and 1 pill (40 mg) in the afternoon. 180 tablet 0  . latanoprost (XALATAN) 0.005 % ophthalmic solution Place 1 drop into both eyes at bedtime.  4  . metoprolol succinate (TOPROL-XL) 50 MG 24 hr tablet Take 1 tablet (50 mg total) by mouth daily. Take with or immediately following a meal. 90 tablet 0  . OVER THE COUNTER MEDICATION Place 1 drop into both eyes 2 (two) times daily before lunch and supper. Smooth XP eye drop    . Polyethyl Glycol-Propyl Glycol (SYSTANE ULTRA OP) Place 1 drop into both eyes 4 (four) times daily as needed (dry eyes).    . potassium chloride SA (KLOR-CON M20) 20 MEQ tablet Take 1 tablet (20 mEq total) by mouth 2 (two) times  daily. 90 tablet PRN   No current facility-administered medications on file prior to visit.       Physical Examination     Vitals:   11/11/16 1122  BP: 100/66  Pulse: 73  Resp: 16  Temp: 98.2 F (36.8 C)  TempSrc: Oral  SpO2: 96%  Weight: 150 lb (68 kg)  Height:  (1.6 m)   Body mass index is 26.57 kg/m.  PHYSICAL EXAMINATION: General: The patient appears their stated age.   HEENT:  No gross abnormalities Pulmonary: Respirations are non-labored Abdomen: Soft and non-tender. Musculoskeletal: There are no major deformities.   Neurologic: No focal weakness or paresthesias are detected, pt is hard of hearing.  Skin: There are no rashes noted. Left great toe and left foot bunion ulcer are healed.  Psychiatric: The patient has normal affect. Cardiovascular: There is an irregular rhythm, rate at 73  Vascular: Vessel Right Left  Radial Palpable Palpable  Brachial Palpable Palpable  Carotid Palpable Palpable  Aorta Not palpable N/A  Femoral Palpable Palpable  Popliteal Not palpable Not palpable  PT not Palpable, no Doppler signal not Palpable, + Doppler signal  DP not Palpable, + Doppler signal not Palpable, + Doppler signal   Left foot    Both feet     A/P: 83yo female, failed intervention antegrade with continued pain in left foot. Previous angiogram reviewed and will plan for retrograde access.   Amrom Ore C. Randie Heinz, MD [facility] [place] Office: [phone] Pager: [phone]

## 2016-11-15 ENCOUNTER — Encounter (HOSPITAL_COMMUNITY): Payer: Self-pay | Admitting: Vascular Surgery

## 2016-11-15 MED FILL — Lidocaine HCl Local Inj 2%: INTRAMUSCULAR | Qty: 10 | Status: AC

## 2016-11-24 ENCOUNTER — Telehealth: Payer: Self-pay | Admitting: Internal Medicine

## 2016-11-24 NOTE — Telephone Encounter (Signed)
Verlon Au Gains from [facility] call, patient is out of town until Sunday, they will reschedule patient for next week

## 2016-11-25 ENCOUNTER — Telehealth: Payer: Self-pay

## 2016-11-25 NOTE — Telephone Encounter (Signed)
Faxed [facility] certification and plan of care form for 10/14/2016 to 12/12/2016 @ [phone] on 11/24/2016.

## 2016-11-28 MED FILL — COMBIGAN EYE DROPS: 0.2-0.5 | 25 days supply | Qty: 5 | Fill #8

## 2016-12-08 MED FILL — POTASSIUM CL ER 20 MEQ TABL: 20 | 45 days supply | Qty: 90 | Fill #6

## 2016-12-08 MED FILL — FUROSEMIDE 40 MG TAB: 40 | 45 days supply | Qty: 180 | Fill #3

## 2016-12-13 ENCOUNTER — Ambulatory Visit: Payer: Medicare Other

## 2016-12-13 ENCOUNTER — Emergency Department (HOSPITAL_COMMUNITY): Payer: Medicare Other

## 2016-12-13 ENCOUNTER — Other Ambulatory Visit: Payer: Self-pay

## 2016-12-13 ENCOUNTER — Encounter (HOSPITAL_COMMUNITY): Payer: Self-pay

## 2016-12-13 ENCOUNTER — Inpatient Hospital Stay (HOSPITAL_COMMUNITY)
Admission: EM | Admit: 2016-12-13 | Discharge: 2016-12-15 | DRG: 291 | Disposition: A | Discharge status: Home Health | Payer: Medicare Other | Attending: Internal Medicine | Admitting: Internal Medicine

## 2016-12-13 DIAGNOSIS — R739 Hyperglycemia, unspecified: Secondary | ICD-10-CM | POA: Diagnosis present

## 2016-12-13 DIAGNOSIS — M79604 Pain in right leg: Secondary | ICD-10-CM | POA: Diagnosis not present

## 2016-12-13 DIAGNOSIS — M21612 Bunion of left foot: Secondary | ICD-10-CM | POA: Diagnosis present

## 2016-12-13 DIAGNOSIS — I509 Heart failure, unspecified: Secondary | ICD-10-CM | POA: Diagnosis present

## 2016-12-13 DIAGNOSIS — N179 Acute kidney failure, unspecified: Secondary | ICD-10-CM | POA: Diagnosis not present

## 2016-12-13 DIAGNOSIS — Z7982 Long term (current) use of aspirin: Secondary | ICD-10-CM | POA: Diagnosis not present

## 2016-12-13 DIAGNOSIS — N3941 Urge incontinence: Secondary | ICD-10-CM | POA: Diagnosis not present

## 2016-12-13 DIAGNOSIS — E785 Hyperlipidemia, unspecified: Secondary | ICD-10-CM | POA: Diagnosis not present

## 2016-12-13 DIAGNOSIS — I998 Other disorder of circulatory system: Secondary | ICD-10-CM | POA: Diagnosis not present

## 2016-12-13 DIAGNOSIS — Z7902 Long term (current) use of antithrombotics/antiplatelets: Secondary | ICD-10-CM | POA: Diagnosis not present

## 2016-12-13 DIAGNOSIS — J9601 Acute respiratory failure with hypoxia: Secondary | ICD-10-CM | POA: Diagnosis present

## 2016-12-13 DIAGNOSIS — Z66 Do not resuscitate: Secondary | ICD-10-CM | POA: Diagnosis not present

## 2016-12-13 DIAGNOSIS — I480 Paroxysmal atrial fibrillation: Secondary | ICD-10-CM | POA: Diagnosis present

## 2016-12-13 DIAGNOSIS — Z91013 Allergy to seafood: Secondary | ICD-10-CM

## 2016-12-13 DIAGNOSIS — J040 Acute laryngitis: Secondary | ICD-10-CM | POA: Diagnosis not present

## 2016-12-13 DIAGNOSIS — I739 Peripheral vascular disease, unspecified: Secondary | ICD-10-CM | POA: Diagnosis not present

## 2016-12-13 DIAGNOSIS — I248 Other forms of acute ischemic heart disease: Secondary | ICD-10-CM | POA: Diagnosis not present

## 2016-12-13 DIAGNOSIS — Z79899 Other long term (current) drug therapy: Secondary | ICD-10-CM | POA: Diagnosis not present

## 2016-12-13 DIAGNOSIS — I110 Hypertensive heart disease with heart failure: Principal | ICD-10-CM | POA: Diagnosis present

## 2016-12-13 DIAGNOSIS — I5043 Acute on chronic combined systolic (congestive) and diastolic (congestive) heart failure: Secondary | ICD-10-CM | POA: Diagnosis present

## 2016-12-13 DIAGNOSIS — Z95820 Peripheral vascular angioplasty status with implants and grafts: Secondary | ICD-10-CM

## 2016-12-13 DIAGNOSIS — M79605 Pain in left leg: Secondary | ICD-10-CM | POA: Diagnosis not present

## 2016-12-13 DIAGNOSIS — Z515 Encounter for palliative care: Secondary | ICD-10-CM | POA: Diagnosis not present

## 2016-12-13 LAB — CBC WITH DIFFERENTIAL/PLATELET
BASOS ABS: 0 10*3/uL (ref 0.0–0.1)
Basophils Relative: 0 %
Eosinophils Absolute: 0.1 10*3/uL (ref 0.0–0.7)
Eosinophils Relative: 2 %
HEMATOCRIT: 38.1 % (ref 36.0–46.0)
Hemoglobin: 12.2 g/dL (ref 12.0–15.0)
LYMPHS ABS: 1.3 10*3/uL (ref 0.7–4.0)
Lymphocytes Relative: 33 %
MCH: 29 pg (ref 26.0–34.0)
MCHC: 32 g/dL (ref 30.0–36.0)
MCV: 90.7 fL (ref 78.0–100.0)
Monocytes Absolute: 0.4 10*3/uL (ref 0.1–1.0)
Monocytes Relative: 9 %
Neutro Abs: 2.2 10*3/uL (ref 1.7–7.7)
Neutrophils Relative %: 56 %
PLATELETS: 121 10*3/uL — AB (ref 150–400)
RBC: 4.2 MIL/uL (ref 3.87–5.11)
RDW: 16.1 % — ABNORMAL HIGH (ref 11.5–15.5)
WBC: 4 10*3/uL (ref 4.0–10.5)

## 2016-12-13 LAB — COMPREHENSIVE METABOLIC PANEL
ALT: 21 U/L (ref 14–54)
AST: 28 U/L (ref 15–41)
Albumin: 2.8 g/dL — ABNORMAL LOW (ref 3.5–5.0)
Alkaline Phosphatase: 58 U/L (ref 38–126)
Anion gap: 8 (ref 5–15)
BUN: 34 mg/dL — ABNORMAL HIGH (ref 6–20)
CHLORIDE: 103 mmol/L (ref 101–111)
CO2: 25 mmol/L (ref 22–32)
CREATININE: 1.13 mg/dL — AB (ref 0.44–1.00)
Calcium: 8.6 mg/dL — ABNORMAL LOW (ref 8.9–10.3)
GFR, EST AFRICAN AMERICAN: 49 mL/min — AB (ref >60)
GFR, EST NON AFRICAN AMERICAN: 42 mL/min — AB (ref >60)
Glucose, Bld: 218 mg/dL — ABNORMAL HIGH (ref 65–99)
Potassium: 4.9 mmol/L (ref 3.5–5.1)
Sodium: 136 mmol/L (ref 135–145)
Total Bilirubin: 1 mg/dL (ref 0.3–1.2)
Total Protein: 6.6 g/dL (ref 6.5–8.1)

## 2016-12-13 LAB — PROTIME-INR
INR: 1.33
PROTHROMBIN TIME: 16.3 s — AB (ref 11.4–15.2)

## 2016-12-13 LAB — I-STAT CG4 LACTIC ACID, ED: LACTIC ACID, VENOUS: 1.53 mmol/L (ref 0.5–1.9)

## 2016-12-13 LAB — TROPONIN I: Troponin I: 0.09 ng/mL (ref <0.03)

## 2016-12-13 LAB — BRAIN NATRIURETIC PEPTIDE: B Natriuretic Peptide: >4500 pg/mL — ABNORMAL HIGH (ref 0.0–100.0)

## 2016-12-13 MED ORDER — ASPIRIN EC 81 MG PO TBEC
81.0000 mg | DELAYED_RELEASE_TABLET | Freq: Every day | ORAL | Status: DC
  Administered 2016-12-14 – 2016-12-15 (×2): 81 mg via ORAL
  Filled 2016-12-13 (×2): qty 1

## 2016-12-13 MED ORDER — BRIMONIDINE TARTRATE 0.2 % OP SOLN
1.0000 [drp] | Freq: Two times a day (BID) | OPHTHALMIC | Status: DC
  Administered 2016-12-13 – 2016-12-15 (×4): 1 [drp] via OPHTHALMIC
  Filled 2016-12-13: qty 5

## 2016-12-13 MED ORDER — SENNOSIDES-DOCUSATE SODIUM 8.6-50 MG PO TABS: 1.0000 | ORAL_TABLET | Freq: Every evening | ORAL | Status: DC | PRN

## 2016-12-13 MED ORDER — TIMOLOL MALEATE 0.5 % OP SOLN
1.0000 [drp] | Freq: Two times a day (BID) | OPHTHALMIC | Status: DC
  Administered 2016-12-13 – 2016-12-15 (×4): 1 [drp] via OPHTHALMIC
  Filled 2016-12-13: qty 5

## 2016-12-13 MED ORDER — ATORVASTATIN CALCIUM 40 MG PO TABS
40.0000 mg | ORAL_TABLET | Freq: Every day | ORAL | Status: DC
  Administered 2016-12-14 – 2016-12-15 (×2): 40 mg via ORAL
  Filled 2016-12-13 (×2): qty 1

## 2016-12-13 MED ORDER — ACETAMINOPHEN 650 MG RE SUPP: 650.0000 mg | Freq: Four times a day (QID) | RECTAL | Status: DC | PRN

## 2016-12-13 MED ORDER — LATANOPROST 0.005 % OP SOLN
1.0000 [drp] | Freq: Every day | OPHTHALMIC | Status: DC
  Administered 2016-12-13 – 2016-12-14 (×2): 1 [drp] via OPHTHALMIC
  Filled 2016-12-13: qty 2.5

## 2016-12-13 MED ORDER — FUROSEMIDE 10 MG/ML IJ SOLN
40.0000 mg | Freq: Two times a day (BID) | INTRAMUSCULAR | Status: DC
  Administered 2016-12-14: 40 mg via INTRAVENOUS
  Filled 2016-12-13: qty 4

## 2016-12-13 MED ORDER — CLOPIDOGREL BISULFATE 75 MG PO TABS
75.0000 mg | ORAL_TABLET | Freq: Every day | ORAL | Status: DC
  Administered 2016-12-14 – 2016-12-15 (×2): 75 mg via ORAL
  Filled 2016-12-13 (×2): qty 1

## 2016-12-13 MED ORDER — BRIMONIDINE TARTRATE-TIMOLOL 0.2-0.5 % OP SOLN: 1.0000 [drp] | Freq: Two times a day (BID) | OPHTHALMIC | Status: DC

## 2016-12-13 MED ORDER — NITROGLYCERIN 2 % TD OINT
1.0000 [in_us] | TOPICAL_OINTMENT | Freq: Once | TRANSDERMAL | Status: AC
  Administered 2016-12-13: 1 [in_us] via TOPICAL
  Filled 2016-12-13: qty 1

## 2016-12-13 MED ORDER — FUROSEMIDE 10 MG/ML IJ SOLN
20.0000 mg | Freq: Once | INTRAMUSCULAR | Status: AC
  Administered 2016-12-13: 20 mg via INTRAVENOUS
  Filled 2016-12-13: qty 2

## 2016-12-13 MED ORDER — ACETAMINOPHEN 325 MG PO TABS: 650.0000 mg | ORAL_TABLET | Freq: Four times a day (QID) | ORAL | Status: DC | PRN

## 2016-12-13 MED ORDER — DORZOLAMIDE HCL 2 % OP SOLN
1.0000 [drp] | Freq: Two times a day (BID) | OPHTHALMIC | Status: DC
  Administered 2016-12-13 – 2016-12-15 (×4): 1 [drp] via OPHTHALMIC
  Filled 2016-12-13: qty 10

## 2016-12-13 MED ORDER — DIGOXIN 125 MCG PO TABS
0.1250 mg | ORAL_TABLET | Freq: Every day | ORAL | Status: DC
  Administered 2016-12-14 – 2016-12-15 (×2): 0.125 mg via ORAL
  Filled 2016-12-13 (×2): qty 1

## 2016-12-13 MED ORDER — METOPROLOL SUCCINATE ER 50 MG PO TB24
50.0000 mg | ORAL_TABLET | Freq: Every day | ORAL | Status: DC
  Administered 2016-12-14 – 2016-12-15 (×2): 50 mg via ORAL
  Filled 2016-12-13 (×2): qty 1

## 2016-12-13 MED ORDER — FUROSEMIDE 10 MG/ML IJ SOLN
20.0000 mg | Freq: Once | INTRAMUSCULAR | Status: DC
  Filled 2016-12-13: qty 2

## 2016-12-13 MED ORDER — ENOXAPARIN SODIUM 40 MG/0.4ML ~~LOC~~ SOLN
40.0000 mg | SUBCUTANEOUS | Status: DC
  Filled 2016-12-13: qty 0.4

## 2016-12-13 NOTE — ED Notes (Signed)
Pt made aware of bed assignment 

## 2016-12-13 NOTE — ED Notes (Signed)
Patient transported to X-ray 

## 2016-12-13 NOTE — ED Provider Notes (Signed)
[facility] EMERGENCY DEPARTMENT Provider Note   CSN: [id number] Arrival date & time: 12/13/16  1337     History   Chief Complaint Chief Complaint  Patient presents with  . Foot Pain    HPI Jean Moore is a 83 y.o. female.  HPI History is obtained from patient and from patient's son and daughter who accompany her.  Patient complains of bilateral foot pain for 3 weeks intermittent worse with walking and improved with remaining still.  She is also had dyspnea on exertion for 3 weeks and bilateral leg swelling for 3 weeks.  At baseline patient can walk in her house.  Over the past 3 weeks she has become mildly dyspneic with walking..  Her daughter also reports she had a bunion on her left foot for several weeks.  Patient is presently pain-free and asymptomatic.  Other associated symptoms include hoarseness for several weeks.  Treatment prior to coming here Past Medical History:  Diagnosis Date  . Atrial fibrillation (HCC)    on coumadin since 2012, recently stopped because of vaginal bleeding  . CHF (congestive heart failure) (HCC)   . Hyperlipidemia   . Hypertension   . Routine adult health maintenance 04/09/2012    Patient Active Problem List   Diagnosis Date Noted  . Skin ulcers of both feet (HCC) 10/05/2016  . Sleep-wake cycle disorder 10/05/2016  . Urge incontinence 06/23/2016  . Toenail torn away 06/23/2016  . Hoarseness 05/19/2016  . Tinea pedis 05/05/2016  . Severe aortic stenosis 04/14/2016  . Encounter for end of life care 05/07/2015  . DNR (do not resuscitate) 05/07/2015  . Multinodular goiter 08/21/2014  . Stenosis of right internal carotid artery 05/07/2014  . Cataract 05/15/2013  . Paroxysmal atrial fibrillation (HCC) 04/09/2012  . Chronic systolic congestive heart failure (HCC) 04/09/2012  . Routine adult health maintenance 04/09/2012  . Essential hypertension 03/08/2007    Past Surgical History:  Procedure Laterality Date  . NO PAST  SURGERIES      OB History    No data available       Home Medications    Prior to Admission medications   Medication Sig Start Date End Date Taking? Authorizing Provider  acetaminophen (TYLENOL) 500 MG tablet Take 500 mg by mouth every 6 (six) hours as needed for pain.    [provider]  aspirin 81 MG tablet Take 1 tablet (81 mg total) by mouth daily. 05/06/13   Clegg, Amy D, NP  atorvastatin (LIPITOR) 40 MG tablet TAKE 1 TABLET BY MOUTH DAILY AT 6 PM. 11/14/16   Bensimhon, Bevelyn Buckles, MD  clopidogrel (PLAVIX) 75 MG tablet Take 1 tablet (75 mg total) by mouth daily. 11/14/16 11/14/17  Maeola Harman, MD  COMBIGAN 0.2-0.5 % ophthalmic solution Place 1 drop into both eyes 2 (two) times daily. 09/20/16   [provider]  digoxin (LANOXIN) 0.125 MG tablet Take 1 tablet (0.125 mg total) by mouth daily. 04/14/16   Lyn Records, MD  dorzolamide (TRUSOPT) 2 % ophthalmic solution Place 1 drop into both eyes 2 (two) times daily. 09/22/16   [provider]  furosemide (LASIX) 40 MG tablet Take 2 pills (80 mg) in the morning, and 1 pill (40 mg) in the afternoon. 05/19/16   Carolynn Comment, MD  latanoprost (XALATAN) 0.005 % ophthalmic solution Place 1 drop into both eyes at bedtime. 09/20/16   [provider]  metoprolol succinate (TOPROL-XL) 50 MG 24 hr tablet Take 1 tablet (50 mg  total) by mouth daily. Take with or immediately following a meal. 04/10/15   Selina Cooley, MD  OVER THE COUNTER MEDICATION Place 1 drop into both eyes 2 (two) times daily before lunch and supper. Smooth XP eye drop    [provider]  Polyethyl Glycol-Propyl Glycol (SYSTANE ULTRA OP) Place 1 drop into both eyes 4 (four) times daily as needed (dry eyes).    [provider]  potassium chloride SA (KLOR-CON M20) 20 MEQ tablet Take 1 tablet (20 mEq total) by mouth 2 (two) times daily. 12/10/15   Eulah Pont, MD    Family History Family History  Problem Relation Age of Onset    . Hypertension Mother   . Hypertension Father   . Thyroid disease Neg Hx     Social History Social History   Tobacco Use  . Smoking status: Never Smoker  . Smokeless tobacco: Never Used  Substance Use Topics  . Alcohol use: No    Alcohol/week: 0.0 oz  . Drug use: No     Allergies   Shrimp [shellfish allergy]   Review of Systems Review of Systems  Respiratory: Positive for shortness of breath.   Cardiovascular: Positive for leg swelling.  Musculoskeletal: Positive for arthralgias and gait problem.       BiLateral foot pain.  Walks with walker  Skin: Positive for color change.       Darkened skin below the knees for several weeks     Physical Exam Updated Vital Signs BP 118/87   Temp (!) 97.4 F (36.3 C) (Oral)   Resp 20   Ht 5\' 3"  (1.6 m)   Wt (S) 68 kg (150 lb) Comment: pt reports it has been 2 weeks since she weighed   LMP 10/12/1978   BMI 26.57 kg/m   Physical Exam  Constitutional:  Chronically ill-appearing.  No distress  HENT:  Head: Normocephalic and atraumatic.  Eyes: Conjunctivae are normal. Pupils are equal, round, and reactive to light.  Neck: Neck supple. No JVD present. No tracheal deviation present. No thyromegaly present.  Cardiovascular: Normal rate.  No murmur heard. Irregularly irregular  Pulmonary/Chest: Effort normal and breath sounds normal.  Abdominal: Soft. Bowel sounds are normal. She exhibits no distension. There is no tenderness.  Musculoskeletal: Normal range of motion. She exhibits edema. She exhibits no tenderness.  1+ pretibial pitting edema bilaterally left lower extremity with brawny chronic appearing skin changes at the distal one third of the leg and foot.  DP and PT pulses absent.  There is a time sized shallow appearing wound at the medial aspect of the foot which is unchanged from photograph in chart from 11/24/2016.  Popliteal pulses palpable femoral pulses palpable.  Skin is neither cool nor hot as compared to  contralateral leg.  Right lower extremity DP pulse obtainable by Doppler PT pulse absent popliteal pulse palpable femoral pulse palpable.  Good capillary refill.  Bilateral upper extremities without redness finger tenderness neurovascular intact  Neurological: She is alert. Coordination normal.  Skin: Skin is warm and dry. No rash noted.  Psychiatric: She has a normal mood and affect.  Nursing note and vitals reviewed.    ED Treatments / Results  Labs (all labs ordered are listed, but only abnormal results are displayed) Labs Reviewed  COMPREHENSIVE METABOLIC PANEL - Abnormal; Notable for the following components:      Result Value   Glucose, Bld 218 (*)    BUN 34 (*)    Creatinine, Ser 1.13 (*)  Calcium 8.6 (*)    Albumin 2.8 (*)    GFR calc non Af Amer 42 (*)    GFR calc Af Amer 49 (*)    All other components within normal limits  CBC WITH DIFFERENTIAL/PLATELET - Abnormal; Notable for the following components:   RDW 16.1 (*)    Platelets 121 (*)    All other components within normal limits  BRAIN NATRIURETIC PEPTIDE  TROPONIN I  I-STAT CG4 LACTIC ACID, ED  I-STAT CG4 LACTIC ACID, ED   Results for orders placed or performed during the hospital encounter of 12/13/16  Comprehensive metabolic panel  Result Value Ref Range   Sodium 136 135 - 145 mmol/L   Potassium 4.9 3.5 - 5.1 mmol/L   Chloride 103 101 - 111 mmol/L   CO2 25 22 - 32 mmol/L   Glucose, Bld 218 (H) 65 - 99 mg/dL   BUN 34 (H) 6 - 20 mg/dL   Creatinine, Ser 1.96 (H) 0.44 - 1.00 mg/dL   Calcium 8.6 (L) 8.9 - 10.3 mg/dL   Total Protein 6.6 6.5 - 8.1 g/dL   Albumin 2.8 (L) 3.5 - 5.0 g/dL   AST 28 15 - 41 U/L   ALT 21 14 - 54 U/L   Alkaline Phosphatase 58 38 - 126 U/L   Total Bilirubin 1.0 0.3 - 1.2 mg/dL   GFR calc non Af Amer 42 (L) >60 mL/min   GFR calc Af Amer 49 (L) >60 mL/min   Anion gap 8 5 - 15  CBC with Differential  Result Value Ref Range   WBC 4.0 4.0 - 10.5 K/uL   RBC 4.20 3.87 - 5.11 MIL/uL    Hemoglobin 12.2 12.0 - 15.0 g/dL   HCT 22.2 97.9 - 89.2 %   MCV 90.7 78.0 - 100.0 fL   MCH 29.0 26.0 - 34.0 pg   MCHC 32.0 30.0 - 36.0 g/dL   RDW 11.9 (H) 41.7 - 40.8 %   Platelets 121 (L) 150 - 400 K/uL   Neutrophils Relative % 56 %   Neutro Abs 2.2 1.7 - 7.7 K/uL   Lymphocytes Relative 33 %   Lymphs Abs 1.3 0.7 - 4.0 K/uL   Monocytes Relative 9 %   Monocytes Absolute 0.4 0.1 - 1.0 K/uL   Eosinophils Relative 2 %   Eosinophils Absolute 0.1 0.0 - 0.7 K/uL   Basophils Relative 0 %   Basophils Absolute 0.0 0.0 - 0.1 K/uL  Brain natriuretic peptide  Result Value Ref Range   B Natriuretic Peptide >4,500.0 (H) 0.0 - 100.0 pg/mL  Troponin I  Result Value Ref Range   Troponin I 0.09 (HH) <0.03 ng/mL  Protime-INR  Result Value Ref Range   Prothrombin Time 16.3 (H) 11.4 - 15.2 seconds   INR 1.33   I-Stat CG4 Lactic Acid, ED  Result Value Ref Range   Lactic Acid, Venous 1.53 0.5 - 1.9 mmol/L   Dg Chest 2 View  Result Date: 12/13/2016 CLINICAL DATA:  Episodes of dyspnea for unspecified amount of time. EXAM: CHEST  2 VIEW COMPARISON:  04/14/2015 CXR FINDINGS: Chronic stable enlargement of the cardiac silhouette. Moderate aortic atherosclerosis without aneurysm. Slight interval increase in airspace opacities with central vascular congestion consistent CHF. No significant pleural effusions. Thoracic spondylosis with chronic mild wedge deformity of a midthoracic vertebral body and superior endplate compression of L1. IMPRESSION: Stable enlargement of the cardiac silhouette with aortic atherosclerosis. Central vascular congestion with faint airspace opacities consistent with mild pulmonary edema, slightly  more prominent than on prior. Electronically Signed   By: Tollie Ethavid  Kwon M.D.   On: 12/13/2016 17:02   Dg Foot Complete Left  Result Date: 12/13/2016 CLINICAL DATA:  Nonhealing wound over the left great toe at the level of the nail. Painful left bunion. EXAM: LEFT FOOT - COMPLETE 3+ VIEW  COMPARISON:  Limited views of the left foot dated May 23, 2016 FINDINGS: The bones are subjectively mildly osteopenic. There is a moderate hallux valgus contour of the first ray. There is mild narrowing of the first MTP joint. There is irregularity of the silhouette of the nail of the great toe. No underlying bony changes are observed to suggest osteomyelitis. No abnormal soft tissue gas collections are observed. The remainder the bones of the foot exhibit no acute abnormalities. There is mild soft tissue swelling over the dorsum of the midfoot. IMPRESSION: No radiographic evidence of osteomyelitis of the great toe. Moderate hallux valgus contour of the first ray with mild degenerative change of the first MTP joint. Electronically Signed   By: David  SwazilandJordan M.D.   On: 12/13/2016 14:52   EKG  EKG Interpretation  Date/Time:  Tuesday December 13 2016 17:22:16 EST Ventricular Rate:  71 PR Interval:    QRS Duration: 144 QT Interval:  418 QTC Calculation: 455 R Axis:   -105 Text Interpretation:  Atrial fibrillation Nonspecific IVCD with LAD Abnormal lateral Q waves Anterior infarct, old No significant change since last tracing Confirmed by Doug SouJacubowitz, Brianah Hopson [phone]([id number]) on 12/13/2016 5:56:57 PM       Radiology Dg Foot Complete Left  Result Date: 12/13/2016 CLINICAL DATA:  Nonhealing wound over the left great toe at the level of the nail. Painful left bunion. EXAM: LEFT FOOT - COMPLETE 3+ VIEW COMPARISON:  Limited views of the left foot dated May 23, 2016 FINDINGS: The bones are subjectively mildly osteopenic. There is a moderate hallux valgus contour of the first ray. There is mild narrowing of the first MTP joint. There is irregularity of the silhouette of the nail of the great toe. No underlying bony changes are observed to suggest osteomyelitis. No abnormal soft tissue gas collections are observed. The remainder the bones of the foot exhibit no acute abnormalities. There is mild soft tissue swelling  over the dorsum of the midfoot. IMPRESSION: No radiographic evidence of osteomyelitis of the great toe. Moderate hallux valgus contour of the first ray with mild degenerative change of the first MTP joint. Electronically Signed   By: David  [place] M.D.   On: 12/13/2016 14:52    Procedures Procedures (including critical care time)  Medications Ordered in ED Medications - No data to display   Initial Impression / Assessment and Plan / ED Course  I have reviewed the triage vital signs and the nursing notes.  Pertinent labs & imaging results that were available during my care of the patient were reviewed by me and considered in my medical decision making (see chart for details).     Dr Jan FiremanDickson , vascular surgeon was consulted and evaluated patient in the ED and arrange for her to get further outpatient follow-up in 3-4 weeks at his office.  Clinically patient is in mild congestive heart failure corroborated by BNP and elevated troponin.  Lasix, topical nitroglycerin ordered.  [facility] for [facility].  We will also consult palliative care.  Patient is DNR 755 pmCorrection in light of patient's poor ejection fraction and aortic stenosis nitroglycerin paste was removed.  Final Clinical Impressions(s) / ED Diagnoses  Diagnoses #1 congestive heart failure #2 hyperglycemia #3 peripheral vascular disease Final diagnoses:  None    ED Discharge Orders    None       Doug Sou, MD 12/13/16 2000

## 2016-12-13 NOTE — ED Notes (Signed)
Per Dr. Ethelda Chick remove NTG paste

## 2016-12-13 NOTE — ED Notes (Signed)
ED Provider at bedside. 

## 2016-12-13 NOTE — H&P (Signed)
Date: 12/13/2016               Patient Name:  Jean Moore MRN: [id number]  DOB: 01-04-1927 Age / Sex: 83 y.o., female   PCP: Alphonzo Grieve, MD         Medical Service: Internal Medicine Teaching Service         Attending Physician: Dr. Aldine Contes, MD    First Contact: Dr. Tawny Asal Pager: [phone]  Second Contact: Dr. Burgess Estelle Pager: [phone]       After Hours (After 5p/  First Contact Pager: [phone]  weekends / holidays): Second Contact Pager: [phone]   Chief Complaint:  Bilateral lower extremity pain and shortness of breath  History of Present Illness:  Jean Moore is an 83 yo with a PMH of HFrEF (LV 15% on echo 03/2016), HTN, HLD, paroxysmal A-Fib, and PAD who is presenting with a 2-3 week history of progressive dyspnea on exertion and bilateral lower extremity swelling. History was obtained by patient and with assistance of her son with whom she lives with and his her primary caregiver. The patient underwent a procedure about 1 month ago to help with bilateral lower extremity claudication and felt well immediately after the procedure. About 2 weeks after the procedure the patient started to have some trouble breathing while walking around her house. At this same time the patient noticed non-painful lower extremity swelling, which is worse while standing and walking around house but improved with elevating her feet. She denies radiating chest pain, palpitations, and cough with her SOB. She also denies fevers, chills, changes in bowel/bladder habits, headaches, dizziness, and abdominal pain. Her symptoms have worsened to the point where she has to take breaks while walking to the bathroom to catch her breath, which is why she decided to come to the ED for evaluation.   Patient and son state that she is usually able to take all of her medications every day. However, the patient does note that she does not like taking Lasix as prescribed if she is going to church or  if she is going out of the house on errands because it makes her urinate too much. With her urge incontinence she does not like taking this medication if she will be out and isn't sure that she can quickly get to a bathroom.   In the ED the patient was found to be afebrile and tachypneic with oxygen saturation of 85% on room air. Her EKG showed atrial fibrillation without RVR. Troponin elevated to 0.09, with BNP >4,500. Her chest x ray showed mild pulmonary edema and cardiomegaly. No pleural effusions or focal consolidations were noted. Patient was given 20 mg IV lasix for volume overload and IMTS was called for admission.   Meds:  Current Meds  Medication Sig  . acetaminophen (TYLENOL) 500 MG tablet Take 500 mg by mouth every 6 (six) hours as needed for pain.  Marland Kitchen aspirin 81 MG tablet Take 1 tablet (81 mg total) by mouth daily.  Marland Kitchen atorvastatin (LIPITOR) 40 MG tablet TAKE 1 TABLET BY MOUTH DAILY AT 6 PM. (Patient taking differently: TAKE 1 TABLET BY MOUTH DAILY)  . clopidogrel (PLAVIX) 75 MG tablet Take 1 tablet (75 mg total) by mouth daily.  . COMBIGAN 0.2-0.5 % ophthalmic solution Place 1 drop into both eyes 2 (two) times daily.  . digoxin (LANOXIN) 0.125 MG tablet Take 1 tablet (0.125 mg total) by mouth daily.  . dorzolamide (TRUSOPT) 2 % ophthalmic solution Place  1 drop into both eyes 2 (two) times daily.  . furosemide (LASIX) 40 MG tablet Take 2 pills (80 mg) in the morning, and 1 pill (40 mg) in the afternoon.  . latanoprost (XALATAN) 0.005 % ophthalmic solution Place 1 drop into both eyes at bedtime.  . metoprolol succinate (TOPROL-XL) 50 MG 24 hr tablet Take 1 tablet (50 mg total) by mouth daily. Take with or immediately following a meal.  . Multiple Vitamins-Minerals (CENTRUM SILVER 50+WOMEN PO) Take 1 tablet daily by mouth.  Vladimir Faster Glycol-Propyl Glycol (SYSTANE ULTRA OP) Place 1 drop into both eyes 4 (four) times daily as needed (dry eyes).  . potassium chloride SA (KLOR-CON M20) 20  MEQ tablet Take 1 tablet (20 mEq total) by mouth 2 (two) times daily.   Allergies: Allergies as of 12/13/2016 - Review Complete 12/13/2016  Allergen Reaction Noted  . Shrimp [shellfish allergy] Other (See Comments) 10/12/2011   Past Medical History: Past Medical History:  Diagnosis Date  . Atrial fibrillation ([place])    on coumadin since 2012, recently stopped because of vaginal bleeding  . CHF (congestive heart failure) ([place])   . Hyperlipidemia   . Hypertension   . Routine adult health maintenance 04/09/2012   Past Surgical History: Balloon angioplasty Left popliteal, tibeal-peroneal, and posterior tibial arteries, 11/2016  Family History:  Family History  Problem Relation Age of Onset  . Hypertension Mother   . Hypertension Father   . Thyroid disease Neg Hx    Social History:  Patient lives with her son and walks with a walker at baseline. She completes most of her ADL's independently with assistance from her son. 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 She denies alcohol and tobacco use.   Review of Systems: A complete ROS was negative except as per HPI.   Physical Exam: Blood pressure (!) 126/92, pulse (!) 54, temperature (!) 97.4 F (36.3 C), temperature source Oral, resp. rate (!) 22, height _0  (1.6 m), weight (S) 150 lb (68 kg), last menstrual period 10/12/1978, SpO2 96 %.  Physical Exam  Constitutional:  Thin elderly woman laying comfortably in bed in no acute distress  HENT:  Mouth/Throat: Oropharynx is clear and moist. No oropharyngeal exudate.  Bilateral tympanic membranes without erythema or effusions.  Eyes: Conjunctivae and EOM are normal.  Neck: JVD (4-5 cm above sternal notch) present.  Cardiovascular:  Irregular rhythm. 3/6 systolic murmur heard at RUSB that radiated to carotids. 2/6 systolic murmur heard at left axilla. 2+ radial pulses bilaterally, 2+ femoral pulses bilaterally, faint popliteal pulses bilaterally, nonpalpable  posterior tibial/dorsalis pedis pulses bilaterally.  Pulmonary/Chest:  Patient able to speak in full sentences during exam. No accessory muscle use or nasal flaring. Intermittent, mild crackles heard at lung bases.   Abdominal: Soft. She exhibits no distension. There is no tenderness. There is no guarding.  Musculoskeletal: She exhibits edema (2+ pitting edema to knees bilaterally). She exhibits no tenderness (of bilateral lower extremities).  Neurological:  Face strengthintact bilaterally. Tongue midline. Gross motor and sensation to light touch of upper and lower extremities intact bilaterally.   Skin:  Skin without pallor. Small ulceration without purulent discharge on medial aspect of L great toe.    EKG: personally reviewed my interpretation is atrial fibrillation without rapid ventricular rate. No signs of ST elevation or ischemia  CXR: personally reviewed my interpretation is cardiomegaly with bilateral pulmonary congestion. No pleural effusions or focal consolidations.  Assessment & Plan by Problem: Active Problems:  Acute on chronic combined systolic and diastolic CHF (congestive heart failure) ([place])  Valeska Haislip is an 83 yo with a PMH of HFrEF (LV 15% on echo 03/2016), HTN, HLD, paroxysmal A-Fib, and PAD who presents with signs and symptoms of acute on chronic heart failure exacerbation. She will be admitted to the internal medicine teaching service for management. The specific problems addressed during admission are as follows:  Acute on chronic HFrEF (LVEF = 15% 03/2016): Patient presented with bilateral pitting edema of lower extremities, progressive dyspnea on exertion, mild pulmonary crackles and increased JVD on exam. Her workup was significant for BNP >4,500 and CXR with mild pulmonary congestion. Altogether workup is consistent with acute CHF exacerbation. Patient's SOB and hypoxia are unlikely to be a result of pneumonia, as patient is afebrile, denies productive cough, and  did not have focal consolidation on imaging. It is also less likely that patient's SOB and hypoxia are a result of PE, as the patient's swelling is bilateral and she has clear signs of volume overload on exam with extensive cardiac history. Patient seen by [facility] as outpatient and has refused ICD placement in the past, but reports compliance with home regimen of lasix 80 mg qAM and 40 mg qPM (unless she is going to church or out on the town). Per chart review patient's weight at previous office visits in 09/2016 and 10/2016 were around 140-145 lbs. Current weight is reported to be 150 lbs. Will plan for IV diuresis and continue to clinically monitor hypoxia/volume status on this therapy. Plan to diurese to apparent dry weight around 140-145 lbs.  -IV Lasix 40 mg BID -Daily weights -Strict I & O -Continue home metoprolol XL 50 mg, digoxin 0.125 mg, and atorvastatin daily -Daily BMP while on lasix  AKI: Cr elevated to 1.13 (eGFR<50) on admission with historical baseline between 0.9-1.0 (eGFR >60). Suspect pre-renal causes, most likely, relative hypotension 2/2 volume overload in HFpEF. Will continue to monitor with IV Lasix therapy. -BMP in AM  Paroxysmal atrial fibrillation: Per chart review patient has refused anticoagulation in the past for paroxysmal A-Fib but is rate controlled with daily metoprolol. Her EKG and PE are consistent with current A-Fib. No signs of hemodynamic instability at this time. Will continue to monitor.  -Telemetry while inpatient -Repeat EKG in AM -Continue home metoprolol XL 50 mg daily  Hx of PAD s/p LLE stent on 11/14/2016: Vascular consulted in ED and did not suggest any signs of acute embolic event in lower extremities/stent failure as a cause of her lower extremity swelling.  -Continue Plavix 75 mg and aspirin 81 mg  Hx of HTN: Patient currently normotensive in ED -Continue home metoprolol XL 50 mg daily  Fluids/Electrolytes: None/replace electrolytes as  needed Nutrition: Heart healthy VTE Prophylaxis: Lovenox Code Status: DNR  Dispo: Admit patient to Inpatient with expected length of stay greater than 2 midnights.  SignedThomasene Ripple, MD 12/13/2016, 9:58 PM  Pager: [phone]

## 2016-12-13 NOTE — ED Triage Notes (Signed)
Pt here for wound on her left foot. She has a dime size wound on her foot. No drainage noted. Not warm to touch. No hx of diabetes. No redness surrounding the area.

## 2016-12-13 NOTE — Consult Note (Signed)
Patient name: Jean Moore MRN: [id number] DOB: [birth year] Sex: female   REASON FOR CONSULT:    Bilateral leg pain.  The consult is requested by the emergency department.  HPI:   Jean Moore is a pleasant 83 y.o. female, who was seen in consultation by Dr. Randie Heinz on 10/19/2016 with a wound on the left foot.  The patient had critical limb ischemia.  She was set up for an arteriogram.  The patient underwent arteriogram on 10/26/2016.  She had a low ejection fraction noted at the time of her arteriogram.  At that time he had attempted to cross the peroneal artery on the left but was not successful.  The patient was brought back on 11/15/2016 and underwent ultrasound-guided cannulation of the left posterior tibial artery and right common femoral artery with drug-coated balloon angioplasty of the left popliteal artery and tibial peroneal trunk.  Patient states that her pain improved after the procedure.  She presents to the emergency department tonight with intermittent pain in both legs.  This occurs sometimes with sitting or when she is supine.  Her activity is very limited so I do not get any history of claudication.  I do not get any clear-cut history of rest pain.  Of note, she is a very poor historian.  She is not a smoker.  She had pain this morning and apparently was told by the office to go to the emergency department.  Currently she is not having pain.  Past Medical History:  Diagnosis Date  . Atrial fibrillation ([facility])    on coumadin since 2012, recently stopped because of vaginal bleeding  . CHF (congestive heart failure) ([facility])   . Hyperlipidemia   . Hypertension   . Routine adult health maintenance 04/09/2012    Family History  Problem Relation Age of Onset  . Hypertension Mother   . Hypertension Father   . Thyroid disease Neg Hx     SOCIAL HISTORY: Social History   Socioeconomic History  . Marital status: Widowed    Spouse name: Not on file  . Number of children: Not on  file  . Years of education: Not on file  . Highest education level: Not on file  Social Needs  . Financial resource strain: Not on file  . Food insecurity - worry: Not on file  . Food insecurity - inability: Not on file  . Transportation needs - medical: Not on file  . Transportation needs - non-medical: Not on file  Occupational History  . Not on file  Tobacco Use  . Smoking status: Never Smoker  . Smokeless tobacco: Never Used  Substance and Sexual Activity  . Alcohol use: No    Alcohol/week: 0.0 oz  . Drug use: No  . Sexual activity: Not on file  Other Topics Concern  . Not on file  Social History Narrative  . Not on file    Allergies  Allergen Reactions  . Shrimp [Shellfish Allergy] Other (See Comments)    Reaction: Hives, Itching, Bumps, Swelling    No current facility-administered medications for this encounter.    Current Outpatient Medications  Medication Sig Dispense Refill  . acetaminophen (TYLENOL) 500 MG tablet Take 500 mg by mouth every 6 (six) hours as needed for pain.    Marland Kitchen aspirin 81 MG tablet Take 1 tablet (81 mg total) by mouth daily. 120 tablet 5  . atorvastatin (LIPITOR) 40 MG tablet TAKE 1 TABLET BY MOUTH DAILY AT 6 PM. (Patient taking differently:  TAKE 1 TABLET BY MOUTH DAILY) 90 tablet 1  . clopidogrel (PLAVIX) 75 MG tablet Take 1 tablet (75 mg total) by mouth daily. 30 tablet 3  . COMBIGAN 0.2-0.5 % ophthalmic solution Place 1 drop into both eyes 2 (two) times daily.  4  . digoxin (LANOXIN) 0.125 MG tablet Take 1 tablet (0.125 mg total) by mouth daily. 30 tablet 11  . dorzolamide (TRUSOPT) 2 % ophthalmic solution Place 1 drop into both eyes 2 (two) times daily.  4  . furosemide (LASIX) 40 MG tablet Take 2 pills (80 mg) in the morning, and 1 pill (40 mg) in the afternoon. 180 tablet 0  . latanoprost (XALATAN) 0.005 % ophthalmic solution Place 1 drop into both eyes at bedtime.  4  . metoprolol succinate (TOPROL-XL) 50 MG 24 hr tablet Take 1 tablet (50  mg total) by mouth daily. Take with or immediately following a meal. 90 tablet 0  . Multiple Vitamins-Minerals (CENTRUM SILVER 50+WOMEN PO) Take 1 tablet daily by mouth.    Bertram Gala. Polyethyl Glycol-Propyl Glycol (SYSTANE ULTRA OP) Place 1 drop into both eyes 4 (four) times daily as needed (dry eyes).    . potassium chloride SA (KLOR-CON M20) 20 MEQ tablet Take 1 tablet (20 mEq total) by mouth 2 (two) times daily. 90 tablet PRN    REVIEW OF SYSTEMS:  [X]  denotes positive finding, [ ]  denotes negative finding Cardiac  Comments:  Chest pain or chest pressure:    Shortness of breath upon exertion: X   Short of breath when lying flat:    Irregular heart rhythm: X       Vascular    Pain in calf, thigh, or hip brought on by ambulation:    Pain in feet at night that wakes you up from your sleep:     Blood clot in your veins:    Leg swelling:  X       Pulmonary    Oxygen at home:    Productive cough:     Wheezing:         Neurologic    Sudden weakness in arms or legs:     Sudden numbness in arms or legs:     Sudden onset of difficulty speaking or slurred speech:    Temporary loss of vision in one eye:     Problems with dizziness:         Gastrointestinal    Blood in stool:     Vomited blood:         Genitourinary    Burning when urinating:     Blood in urine:        Psychiatric    Major depression:         Hematologic    Bleeding problems:    Problems with blood clotting too easily:        Skin    Rashes or ulcers:        Constitutional    Fever or chills:     PHYSICAL EXAM:   Vitals:   12/13/16 1614 12/13/16 1615 12/13/16 1630 12/13/16 1730  BP:  118/87 121/88 122/85  Pulse:   95   Resp:  20 20 (!) 26  Temp:      TempSrc:      SpO2:    100%  Weight: (S) 150 lb (68 kg)     Height: 5\' 3"  (1.6 m)       GENERAL: The patient is a well-nourished female, in no acute  distress. The vital signs are documented above. CARDIAC: She has a systolic ejection murmur. VASCULAR: I  do not detect carotid bruits. On the right side, she has a palpable femoral pulse.  I cannot palpate pedal pulses.  She has an anterior tibial signal with a Doppler that is monophasic. On the left side she has a palpable femoral pulse.  She has a fairly brisk posterior tibial signal and also a peroneal signal with the Doppler. She has bilateral lower extremity swelling. PULMONARY: There is good air exchange bilaterally without wheezing or rales. ABDOMEN: Soft and non-tender with normal pitched bowel sounds.  MUSCULOSKELETAL: There are no major deformities or cyanosis. NEUROLOGIC: No focal weakness or paresthesias are detected. SKIN: She has a small wound on the medial aspect of her left first metatarsal. PSYCHIATRIC: The patient has a normal affect.  DATA:    CHEST X-RAY: She has mild pulmonary edema.  Her GFR is 49.  MEDICAL ISSUES:   BILATERAL INFRAINGUINAL ARTERIAL OCCLUSIVE DISEASE: This patient has intermittent pain in both legs.  She clearly has not had an acute ischemic event.  She is undergone retrograde cannulation on the left with an excellent result and has a brisk posterior tibial signal with the Doppler.  She has an anterior tibial signal on the right both feet appear adequately perfused.  She has bilateral lower extremity edema with a history of congestive heart failure.  Given her cardiac history with a low ejection fraction and systolic murmur she is obviously a poor candidate for revascularization short of any further endovascular attempts.  I will arrange for her to see Dr. Randie Heinz in the office in 3-4 weeks to continue to follow her right leg pain.  Waverly Ferrari [facility] [phone]

## 2016-12-14 ENCOUNTER — Other Ambulatory Visit: Payer: Self-pay

## 2016-12-14 DIAGNOSIS — K0889 Other specified disorders of teeth and supporting structures: Secondary | ICD-10-CM | POA: Diagnosis not present

## 2016-12-14 DIAGNOSIS — Z515 Encounter for palliative care: Secondary | ICD-10-CM | POA: Diagnosis not present

## 2016-12-14 DIAGNOSIS — Z95820 Peripheral vascular angioplasty status with implants and grafts: Secondary | ICD-10-CM

## 2016-12-14 DIAGNOSIS — I739 Peripheral vascular disease, unspecified: Secondary | ICD-10-CM

## 2016-12-14 DIAGNOSIS — Z79899 Other long term (current) drug therapy: Secondary | ICD-10-CM

## 2016-12-14 DIAGNOSIS — Z8249 Family history of ischemic heart disease and other diseases of the circulatory system: Secondary | ICD-10-CM

## 2016-12-14 DIAGNOSIS — I509 Heart failure, unspecified: Secondary | ICD-10-CM | POA: Diagnosis not present

## 2016-12-14 DIAGNOSIS — E785 Hyperlipidemia, unspecified: Secondary | ICD-10-CM | POA: Diagnosis not present

## 2016-12-14 DIAGNOSIS — I110 Hypertensive heart disease with heart failure: Secondary | ICD-10-CM | POA: Diagnosis not present

## 2016-12-14 DIAGNOSIS — Z91013 Allergy to seafood: Secondary | ICD-10-CM | POA: Diagnosis not present

## 2016-12-14 DIAGNOSIS — I878 Other specified disorders of veins: Secondary | ICD-10-CM

## 2016-12-14 DIAGNOSIS — R011 Cardiac murmur, unspecified: Secondary | ICD-10-CM | POA: Diagnosis not present

## 2016-12-14 DIAGNOSIS — I480 Paroxysmal atrial fibrillation: Secondary | ICD-10-CM | POA: Diagnosis not present

## 2016-12-14 DIAGNOSIS — I5023 Acute on chronic systolic (congestive) heart failure: Secondary | ICD-10-CM | POA: Diagnosis not present

## 2016-12-14 DIAGNOSIS — L97528 Non-pressure chronic ulcer of other part of left foot with other specified severity: Secondary | ICD-10-CM

## 2016-12-14 DIAGNOSIS — Z66 Do not resuscitate: Secondary | ICD-10-CM

## 2016-12-14 DIAGNOSIS — Z7902 Long term (current) use of antithrombotics/antiplatelets: Secondary | ICD-10-CM

## 2016-12-14 DIAGNOSIS — N179 Acute kidney failure, unspecified: Secondary | ICD-10-CM

## 2016-12-14 DIAGNOSIS — I5043 Acute on chronic combined systolic (congestive) and diastolic (congestive) heart failure: Secondary | ICD-10-CM | POA: Diagnosis not present

## 2016-12-14 DIAGNOSIS — Z7982 Long term (current) use of aspirin: Secondary | ICD-10-CM

## 2016-12-14 DIAGNOSIS — N3941 Urge incontinence: Secondary | ICD-10-CM

## 2016-12-14 LAB — CBC
HEMATOCRIT: 40.3 % (ref 36.0–46.0)
HEMATOCRIT: 36.6 % (ref 36.0–46.0)
HEMOGLOBIN: 12.5 g/dL (ref 12.0–15.0)
HEMOGLOBIN: 11.3 g/dL — AB (ref 12.0–15.0)
MCH: 28 pg (ref 26.0–34.0)
MCH: 27.7 pg (ref 26.0–34.0)
MCHC: 31 g/dL (ref 30.0–36.0)
MCHC: 30.9 g/dL (ref 30.0–36.0)
MCV: 90.4 fL (ref 78.0–100.0)
MCV: 89.7 fL (ref 78.0–100.0)
Platelets: 126 10*3/uL — ABNORMAL LOW (ref 150–400)
Platelets: 125 10*3/uL — ABNORMAL LOW (ref 150–400)
RBC: 4.46 MIL/uL (ref 3.87–5.11)
RBC: 4.08 MIL/uL (ref 3.87–5.11)
RDW: 15.6 % — AB (ref 11.5–15.5)
RDW: 15.5 % (ref 11.5–15.5)
WBC: 4 10*3/uL (ref 4.0–10.5)
WBC: 3.3 10*3/uL — AB (ref 4.0–10.5)

## 2016-12-14 LAB — BASIC METABOLIC PANEL
ANION GAP: 7 (ref 5–15)
BUN: 33 mg/dL — AB (ref 6–20)
CHLORIDE: 102 mmol/L (ref 101–111)
CO2: 29 mmol/L (ref 22–32)
Calcium: 8.4 mg/dL — ABNORMAL LOW (ref 8.9–10.3)
Creatinine, Ser: 1.1 mg/dL — ABNORMAL HIGH (ref 0.44–1.00)
GFR calc Af Amer: 50 mL/min — ABNORMAL LOW (ref >60)
GFR calc non Af Amer: 44 mL/min — ABNORMAL LOW (ref >60)
Glucose, Bld: 186 mg/dL — ABNORMAL HIGH (ref 65–99)
POTASSIUM: 4.4 mmol/L (ref 3.5–5.1)
SODIUM: 138 mmol/L (ref 135–145)

## 2016-12-14 LAB — CREATININE, SERUM
Creatinine, Ser: 1 mg/dL (ref 0.44–1.00)
GFR, EST AFRICAN AMERICAN: 57 mL/min — AB (ref >60)
GFR, EST NON AFRICAN AMERICAN: 49 mL/min — AB (ref >60)

## 2016-12-14 LAB — GLUCOSE, CAPILLARY: Glucose-Capillary: 158 mg/dL — ABNORMAL HIGH (ref 65–99)

## 2016-12-14 MED ORDER — FUROSEMIDE 80 MG PO TABS
80.0000 mg | ORAL_TABLET | Freq: Every day | ORAL | Status: DC
  Administered 2016-12-15: 80 mg via ORAL
  Filled 2016-12-14: qty 1

## 2016-12-14 MED ORDER — FUROSEMIDE 40 MG PO TABS
40.0000 mg | ORAL_TABLET | Freq: Every day | ORAL | Status: DC
  Administered 2016-12-15: 40 mg via ORAL
  Filled 2016-12-14: qty 1

## 2016-12-14 MED ORDER — LIDOCAINE 5 % EX PTCH
1.0000 | MEDICATED_PATCH | CUTANEOUS | Status: DC
  Administered 2016-12-14 – 2016-12-15 (×2): 1 via TRANSDERMAL
  Filled 2016-12-14 (×2): qty 1

## 2016-12-14 MED ORDER — CARBAMIDE PEROXIDE 6.5 % OT SOLN
5.0000 [drp] | Freq: Two times a day (BID) | OTIC | Status: DC
  Administered 2016-12-14 – 2016-12-15 (×3): 5 [drp] via OTIC
  Filled 2016-12-14: qty 15

## 2016-12-14 MED ORDER — BIOTENE DRY MOUTH MT LIQD: 15.0000 mL | OROMUCOSAL | Status: DC | PRN

## 2016-12-14 MED ORDER — FUROSEMIDE 10 MG/ML IJ SOLN
80.0000 mg | Freq: Once | INTRAMUSCULAR | Status: AC
  Administered 2016-12-14: 80 mg via INTRAVENOUS
  Filled 2016-12-14: qty 8

## 2016-12-14 MED ORDER — MAGIC MOUTHWASH
10.0000 mL | Freq: Four times a day (QID) | ORAL | Status: DC
  Administered 2016-12-14 – 2016-12-15 (×6): 10 mL via ORAL
  Filled 2016-12-14 (×7): qty 10

## 2016-12-14 NOTE — Evaluation (Signed)
Physical Therapy Evaluation Patient Details Name: Jean Moore MRN: [id number] DOB: 09/24/1926 Today's Date: 12/14/2016   History of Present Illness  Pt is an 83 y.o. female with PMH ofHFrEF (LV 15% on echo 03/2016), HTN, HLD, paroxysmal A-Fib (rate controlled with daily metoprolol), and PAD who is presented on 12/13/16 with a 2-3 wk history of progressive dyspnea on exertion and BLE swelling; found to be afebrile and tachypneic in ED. EKG showed a-fib without RVR. CXR showed pulmonary edema and cardiomegaly. Determined to have signs and symptoms of acute on chronic HF exacerbation. Of note, pt had LLE stent on 11/14/2016 for BLE claudication.     Clinical Impression  Pt presents with an overall decrease in functional mobility secondary to above. PTA, pt reports that she had a new onset of generalized weakness starting ~3 weeks ago, and since this has been reliant on her son for increased assist for bed mob, amb, and ADLs; prior to this onset, she was mod indep with RW for ambulation and ADLs. Today, pt required minA for transfer and amb with RW; decreased safety awareness with RW, requiring repeated multimodal cues for direction. Pt demonstrates significant increased risk for falls secondary to decreased balance, weakness, and decreased safety awareness. Son is not available for 24/7 assist. Feel pt would benefit from SNF-level therapies upon d/c to maximize functional mobility; pt willing to consider this and wants to discuss with son. If not SNF, will need HHPT follow-up upon d/c. Will continue to follow acutely to address establish goals.    Follow Up Recommendations SNF;Supervision for mobility/OOB    Equipment Recommendations  None recommended by PT    Recommendations for Other Services OT consult     Precautions / Restrictions Precautions Precautions: Fall Restrictions Weight Bearing Restrictions: No      Mobility  Bed Mobility Overal bed mobility: Needs Assistance Bed Mobility:  Supine to Sit     Supine to sit: Mod assist     General bed mobility comments: MonA to hold RUE for pt to pull trunk into sitting and scoot hips towards EOB; good technique maneuvering BLEs to EOB prior to this  Transfers Overall transfer level: Needs assistance Equipment used: Rolling walker (2 wheeled) Transfers: Sit to/from Stand Sit to Stand: Min assist         General transfer comment: Stood from bed and toilet with RW and minA to assist trunk into elevation; cues for hand placement on RW  Ambulation/Gait Ambulation/Gait assistance: Min assist Ambulation Distance (Feet): 20 Feet Assistive device: Rolling walker (2 wheeled) Gait Pattern/deviations: Step-to pattern;Trunk flexed Gait velocity: Decreased Gait velocity interpretation: <1.8 ft/sec, indicative of risk for recurrent falls General Gait Details: Amb to/from bathroom with RW and minA to keep RW closer to body as pt hold it beyond base of support; educ on keeping closer for fall risk reduction, but pt continued to hold out despite repeated cues. Repeated cues for upright posture as well. Pt declining further amb secondary to fatigue  Stairs            Wheelchair Mobility    Modified Rankin (Stroke Patients Only)       Balance Overall balance assessment: Needs assistance Sitting-balance support: No upper extremity supported;Feet supported Sitting balance-Leahy Scale: Fair     Standing balance support: Bilateral upper extremity supported Standing balance-Leahy Scale: Poor Standing balance comment: Reliant on BUE support               High Level Balance Comments: Son present at beginning  of session to answer questions regarding level of assist provided at home; discussed potential for HHPT v. 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 Pt reports her son has to help with bed mobility. Before all of this, pt mod indep with RW  ADL's / Homemaking Assistance Needed: Over past 3 wks, pt has required supervision/assist for all ADLs        Hand Dominance        Extremity/Trunk Assessment   Upper Extremity Assessment Upper Extremity Assessment: Generalized weakness    Lower Extremity Assessment Lower Extremity Assessment: RLE deficits/detail;LLE deficits/detail RLE Deficits / Details: RLE grossly 4/5 throughout; lower leg/ankle pitting edema 1+ LLE Deficits / Details: LLE grossly 4/5 throughout; lower leg/ankle pitting edema 1+; wound on L toe bunion (wound care nurse aware)    Cervical / Trunk Assessment Cervical / Trunk Assessment: Kyphotic  Communication      Cognition Arousal/Alertness: Awake/alert Behavior During Therapy: Flat affect Overall Cognitive Status: No family/caregiver present to determine baseline cognitive functioning Area of Impairment: Following commands;Safety/judgement                       Following Commands: Follows multi-step commands with increased time Safety/Judgement: Decreased awareness of safety   Problem Solving: Requires verbal cues;Requires tactile cues;Difficulty sequencing General Comments: Pt slow to answer questions initially  (reports fatigue), requiring increased time to follow commands and perform mobility. Decreased safety awareness, requiring cues for safe use with RW, but immediately returning to unsafe technique despite repeated cues.      General Comments      Exercises     Assessment/Plan    PT Assessment Patient needs continued PT services  PT Problem List Decreased strength;Decreased activity tolerance;Decreased balance;Decreased mobility;Decreased cognition;Decreased knowledge of use of DME;Decreased safety awareness;Cardiopulmonary status limiting activity;Decreased skin integrity       PT Treatment Interventions DME instruction;Gait training;Stair training;Functional mobility training;Therapeutic activities;Therapeutic exercise;Balance training;Patient/family education    PT Goals (Current goals can be found in the Care Plan section)  Acute Rehab PT Goals Patient Stated Goal: Return home PT Goal Formulation: With patient Time For Goal Achievement: 12/28/16 Potential to Achieve Goals: Good    Frequency Min 2X/week   Barriers to discharge Decreased caregiver support      Co-evaluation               AM-PAC PT "6 Clicks" Daily Activity  Outcome Measure Difficulty turning over in bed (including adjusting bedclothes, sheets and blankets)?: A Little Difficulty moving from lying on back to sitting on the side of the bed? : Unable Difficulty sitting down on and standing up from a chair with arms (e.g., wheelchair, bedside commode, etc,.)?: Unable Help needed moving to and from a bed  to chair (including a wheelchair)?: A Little Help needed walking in hospital room?: A Little Help needed climbing 3-5 steps with a railing? : A Lot 6 Click Score: 13    End of Session Equipment Utilized During Treatment: Gait belt Activity Tolerance: Patient tolerated treatment well;Patient limited by fatigue Patient left: in chair;with chair alarm set;with call bell/phone within reach Nurse  Communication: Mobility status PT Visit Diagnosis: Unsteadiness on feet (R26.81);Other abnormalities of gait and mobility (R26.89)    Time: 1010-1056 PT Time Calculation (min) (ACUTE ONLY): 46 min   Charges:   PT Evaluation $PT Eval Moderate Complexity: 1 Mod PT Treatments $Therapeutic Activity: 8-22 mins $Self Care/Home Management: 8-22   PT G Codes:       Ina Homes, PT, DPT Acute Rehab Services  Pager: [phone]  Malachy Chamber 12/14/2016, 11:17 AM

## 2016-12-14 NOTE — Progress Notes (Addendum)
Pt is incontinent and wears Depends from home.  Offered Pure Wick and pt refused.  Therefore, unable to obtain strict output.

## 2016-12-14 NOTE — Clinical Social Work Note (Signed)
Clinical Social Work Assessment  Patient Details  Name: Jean Moore MRN: [id number] Date of Birth: 09-29-1926  Date of referral:  12/14/16               Reason for consult:  Facility Placement, Discharge Planning                Permission sought to share information with:  Chartered certified accountant granted to share information::  No  Name::        Agency::     Relationship::     Contact Information:     Housing/Transportation Living arrangements for the past 2 months:  Single Family Home Source of Information:  Patient, Medical Team Patient Interpreter Needed:  None Criminal Activity/Legal Involvement Pertinent to Current Situation/Hospitalization:  No - Comment as needed Significant Relationships:  Adult Children Lives with:  Adult Children Do you feel safe going back to the place where you live?  Yes Need for family participation in patient care:  Yes (Comment)  Care giving concerns:  PT recommending SNF once medically stable for discharge.   Social Worker assessment / plan:  CSW met with patient. No supports at bedside. CSW introduced role and explained that PT recommendations would be discussed. Patient wants to discuss SNF with her children before giving CSW a "definite answer." SNF list and CSW contact card provided. No further concerns. CSW encouraged patient to contact CSW as needed. CSW will continue to follow patient for support and facilitate discharge to SNF once medically stable.  Employment status:  Retired Nurse, adult PT Recommendations:  [place] / Referral to community resources:  [place]  Patient/Family's Response to care:  Patient wants to discuss SNF with her children before making a decision. Patient's children supportive and involved in patient's care. Patient appreciated social work intervention.  Patient/Family's Understanding of and Emotional Response to  Diagnosis, Current Treatment, and Prognosis:  Patient has a good understanding of the reason for admission and the need for therapy after discharge. Patient appears happy with hospital care.  Emotional Assessment Appearance:  Appears stated age Attitude/Demeanor/Rapport:  Other(Pleasant) Affect (typically observed):  Accepting, Appropriate, Calm, Pleasant Orientation:  Oriented to Self, Oriented to Place, Oriented to  Time, Oriented to Situation Alcohol / Substance use:  Never Used Psych involvement (Current and /or in the community):  No (Comment)  Discharge Needs  Concerns to be addressed:  Care Coordination Readmission within the last 30 days:  No Current discharge risk:  Dependent with Mobility Barriers to Discharge:  Continued Medical Work up   Candie Chroman, LCSW 12/14/2016, 3:01 PM

## 2016-12-14 NOTE — Progress Notes (Signed)
CM following for DCP; for possible SNF placement; B Shelba Flake [phone]

## 2016-12-14 NOTE — Progress Notes (Signed)
   Subjective: No acute events overnight following admission.  Patient states her breathing and leg swelling has improved but is not back to her baseline yet.  Also notes soreness of left great toe due to existing wound.  Objective:  Vital signs in last 24 hours: Vitals:   12/14/16 0000 12/14/16 0633 12/14/16 1025 12/14/16 1200  BP: 109/71 100/62 (!) 104/58 118/76  Pulse: 70 (!) 54 62 (!) 57  Resp: 20 20 18 20   Temp: 97.8 F (36.6 C) 98.4 F (36.9 C)  98.4 F (36.9 C)  TempSrc: Oral Oral  Oral  SpO2: 96% 94% 97% 100%  Weight:  144 lb 2.9 oz (65.4 kg)    Height:       General: Elderly female resting in bed comfortably , no acute distress HEENT: Poor dentition, JVD CV: Systolic murmur, irregular rhythm Resp: Clear breath sounds, normal work of breathing, no distress  Abd: Soft, +BS, Non-tender Extr: Pitting edema to bilateral LEs Skin: Warm, dry      Assessment/Plan:   HFrEF exacerbation (EF 15%) Patient has history of heart failure with reduced ejection fraction and presented with signs of volume overload on exam with elevated BNP and edema on chest x-ray.  Her dry weight appears to be 140-145 pounds, though today weight is recorded at 144 still with fluid on exam.  She has received IV diuresis 40 mg twice daily.  Palliative care consulted and will continue to work with patient regarding her treatment preferences going forward, appreciate their assistance. --IV Lasix 80 mg this pm, transition to oral regimen tomorrow  --Strict I/Os, daily weight  --BMP  --Cont home Metoprolol, digoxin --PT, ambulate pt   H/o PAD Patient received a stent to left lower extremity in October 2018, chronic wound to L great toe.  Vascular surgery was consulted in the ED and recommended continuing dual antiplatelet therapy and following up as outpatient.  H/o PAF Patient has a history of atrial fibrillation currently not on anticoagulation due to patient preference, she is rate controlled with  metoprolol. --Continue metoprolol    Dispo: Anticipated discharge in approximately 1-2 day(s).   Ginger Carne, MD 12/14/2016, 4:07 PM Pager: [phone]

## 2016-12-14 NOTE — Consult Note (Signed)
Consultation Note Date: 12/14/2016   Patient Name: Jean Moore  DOB: 11-Mar-1926  MRN: [id number]  Age / Sex: 83 y.o., female  PCP: Alphonzo Grieve, MD Referring Physician: Aldine Contes, MD  Reason for Consultation: Establishing goals of care  HPI/Patient Profile: 83 y.o. female  with past medical history of mixed heart failure (EF 15%), PAD s/p recent procedure, and paroxysmal afib who was admitted on 12/13/2016 with SOB and LE swelling.  Initial work up revealed Afib with RVR, an acute on chronic CHF exacerbation (BNP > 4500), and hypoxic respiratory failure with oxygen sats of 85%.  The patient complained of hoarseness in her throat ("like laryngitis", and swelling in her LE as well as pain in her left foot.   Of note the patient is a long term patient of Dr. Daneen Schick.  Clinical Assessment and Goals of Care:  I have reviewed medical records including EPIC notes, labs and imaging, received report from the bedside RN, assessed the patient and then met at the bedside along with her 2 sons  to discuss diagnosis prognosis, GOC, EOL wishes, disposition and options.  I introduced Palliative Medicine as specialized medical care for people living with serious illness. It focuses on providing relief from the symptoms and stress of a serious illness. The goal is to improve quality of life for both the patient and the family.  We discussed a brief life review of the patient. 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  She raised 5 children who are very close to each other and supportive of her.  She lives with her son Juliann Pares.  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  As far as functional and nutritional status she was able to get from room to room with a rolling walker and was primarily independent with ADLs.  Her  son appears to take excellent care of her.   We discussed their current illness and what it means in the larger context of their on-going co-morbidities.  The patient did not seem to have an understanding that the weakness in her heart is what is causing her legs to swell and her breath to be short.  I attempted to elicit values and goals of care important to the patient.  She makes her own decisions and is firm that she is a DNR/DNI.  Previously she has also decided that she does not want a pacemaker or anticoagulation (she is on plavix and aspirin for PAD).  "When it is my time God will take me with him and I want to go".    Questions and concerns were addressed.      Primary Decision Maker:  PATIENT    SUMMARY OF RECOMMENDATIONS     DNR/DNI  No invasive procedures  Patient does not yet qualify for hospice services at home for heart failure.  Recommend Palliative to follow outpatient with full home health services.  PMT will come by again in the next few days to confirm assessment.  Wants to go home with son when medically optimized.  Full home health services and Palliative.  Code Status/Advance Care Planning:  DNR/DNI   No Pace Maker.  No invasive procedures   Symptom Management:   Dry mouth - thrush? Magic mouth wash and biotene rinse.   Family asks if they can have a rx for magic mouthwash on DC.  L Ear pressure - patient feels it is wax build up.  Debrox ordered.  L. Bunion wound due to PAD.  Wound care order placed.  Lidocaine patch ordered for neuropathy/claudication symptoms  Will recommend Palliative Care to follow outpatient.  Don't feel she quite qualifies for Hospice yet.   Psycho-social/Spiritual:   Desire for further Chaplaincy support: yes, [employer].  Prognosis: given low EF with mixed heart failure and bilateral ischemic occlusive disease patient is at high risk for an acute event.  However she receives excellent care at home and is still mobile and  of clear mind.  Likely 1 year or less.    Discharge Planning: To Be Determined  Likely home with home health and Palliative.      Primary Diagnoses: Present on Admission: . Acute on chronic combined systolic and diastolic CHF (congestive heart failure) ([place])   I have reviewed the medical record, interviewed the patient and family, and examined the patient. The following aspects are pertinent.  Past Medical History:  Diagnosis Date  . Atrial fibrillation ([place])    on coumadin since 2012, recently stopped because of vaginal bleeding  . CHF (congestive heart failure) ([place])   . Hyperlipidemia   . Hypertension   . Routine adult health maintenance 04/09/2012   Social History   Socioeconomic History  . Marital status: Widowed    Spouse name: None  . Number of children: None  . Years of education: None  . Highest education level: None  Social Needs  . Financial resource strain: None  . Food insecurity - worry: None  . Food insecurity - inability: None  . Transportation needs - medical: None  . Transportation needs - non-medical: None  Occupational History  . None  Tobacco Use  . Smoking status: Never Smoker  . Smokeless tobacco: Never Used  Substance and Sexual Activity  . Alcohol use: No    Alcohol/week: 0.0 oz  . Drug use: No  . Sexual activity: None  Other Topics Concern  . None  Social History Narrative  . None   Family History  Problem Relation Age of Onset  . Hypertension Mother   . Hypertension Father   . Thyroid disease Neg Hx    Scheduled Meds: . aspirin EC  81 mg Oral Daily  . atorvastatin  40 mg Oral Daily  . brimonidine  1 drop Both Eyes BID  . carbamide peroxide  5 drop Left EAR BID  . clopidogrel  75 mg Oral Daily  . digoxin  0.125 mg Oral Daily  . dorzolamide  1 drop Both Eyes BID  . enoxaparin (LOVENOX) injection  40 mg Subcutaneous Q24H  . furosemide  20 mg Intravenous Once  . furosemide  40 mg Intravenous BID  . latanoprost  1 drop Both  Eyes QHS  . lidocaine  1 patch Transdermal Q24H  . magic mouthwash  10 mL Oral QID  . metoprolol succinate  50 mg Oral Daily  . timolol  1 drop Both Eyes BID   Continuous Infusions: PRN Meds:.acetaminophen **OR** acetaminophen,  antiseptic oral rinse, senna-docusate Allergies  Allergen Reactions  . Shrimp [Shellfish Allergy] Other (See Comments)    Reaction: Hives, Itching, Bumps, Swelling   Review of Systems left ear pressure, left foot pain, DOE, fatigue, LE swelling, decreased appetite.  Physical Exam  Well developed elderly female, A&O, very pleasant, NAD CV rrr with systolic murmur 3/6 resp no distress Abdomen soft, nt, nd Extremities 2+ pitting Skin swelling and small open wound on left bunion.  Vital Signs: BP 100/62 (BP Location: Left Arm)   Pulse (!) 54   Temp 98.4 F (36.9 C) (Oral)   Resp 20   Ht _0  (1.6 m)   Wt 65.4 kg (144 lb 2.9 oz)   LMP 10/12/1978   SpO2 94%   BMI 25.54 kg/m  Pain Assessment: 0-10   Pain Score: 5    SpO2: SpO2: 94 % O2 Device:SpO2: 94 % O2 Flow Rate: .   IO: Intake/output summary:   Intake/Output Summary (Last 24 hours) at 12/14/2016 0958 Last data filed at 12/14/2016 4033 Gross per 24 hour  Intake 240 ml  Output 520 ml  Net -280 ml    LBM: Last BM Date: 12/13/16 Baseline Weight: Weight: (S) 68 kg (150 lb)(pt reports it has been 2 weeks since she weighed ) Most recent weight: Weight: 65.4 kg (144 lb 2.9 oz)     Palliative Assessment/Data:40%     Time In: 8:30 Time Out: 9:40 Time Total: 70 min. Greater than 50%  of this time was spent counseling and coordinating care related to the above assessment and plan.  Signed by: Florentina Jenny, PA-C Palliative Medicine Pager: [phone]  Please contact Palliative Medicine Team phone at [phone] for questions and concerns.  For individual provider: See Shea Evans

## 2016-12-14 NOTE — Consult Note (Signed)
WOC Nurse wound consult note Reason for Consult: bunion wound, left great toe Wound type: no open wound Pressure Injury POA: NA Measurement: small pinpoint area with surrounding joint pain Wound bed: none Drainage (amount, consistency, odor) none Periwound: edema, palpable pulses  Dressing procedure/placement/frequency: No topical care needed. Pain management per attending.  May benefit from outpatient follow up with podiatrist of her choice   Discussed POC with patient and bedside nurse.  Re consult if needed, will not follow at this time. Thanks  Verlee Pope [employer], RN,CWOCN, CNS, CWON-AP [phone])

## 2016-12-14 NOTE — Evaluation (Signed)
Occupational Therapy Evaluation Patient Details Name: Jean Moore MRN: [id number] DOB: 24-Nov-1926 Today's Date: 12/14/2016    History of Present Illness Pt is an 83 y.o. female with PMH ofHFrEF (LV 15% on echo 03/2016), HTN, HLD, paroxysmal A-Fib (rate controlled with daily metoprolol), and PAD who is presented on 12/13/16 with a 2-3 wk history of progressive dyspnea on exertion and BLE swelling; found to be afebrile and tachypneic in ED. EKG showed a-fib without RVR. CXR showed pulmonary edema and cardiomegaly. Determined to have signs and symptoms of acute on chronic HF exacerbation. Of note, pt had LLE stent on 11/14/2016 for BLE claudication.    Clinical Impression   Pt admitted with above. She demonstrates the below listed deficits and will benefit from continued OT to maximize safety and independence with BADLs.  Pt currently requires min - mod A for ADLs.  She lives with her son who can assist as needed, but is not home with her 24/7.  At this time, feel she would benefit from SNF level rehab to allow her to maximize safety and independence with ADLs       Follow Up Recommendations  SNF;Supervision/Assistance - 24 hour    Equipment Recommendations  None recommended by OT    Recommendations for Other Services       Precautions / Restrictions Precautions Precautions: Fall      Mobility Bed Mobility               General bed mobility comments: Pt sitting up in chair   Transfers Overall transfer level: Needs assistance Equipment used: Rolling walker (2 wheeled) Transfers: Sit to/from [employer] Sit to Stand: Min assist Stand pivot transfers: Min assist       General transfer comment: min A to steady     Balance Overall balance assessment: Needs assistance Sitting-balance support: No upper extremity supported;Feet supported Sitting balance-Leahy Scale: Good     Standing balance support: Bilateral upper extremity supported Standing  balance-Leahy Scale: Poor Standing balance comment: Reliant on BUE support                           ADL either performed or assessed with clinical judgement   ADL Overall ADL's : Needs assistance/impaired Eating/Feeding: Independent   Grooming: Wash/dry hands;Wash/dry face;Oral care;Brushing hair;Minimal assistance;Standing   Upper Body Bathing: Minimal assistance;Sitting   Lower Body Bathing: Moderate assistance;Sit to/from stand   Upper Body Dressing : Minimal assistance;Sitting   Lower Body Dressing: Moderate assistance;Sit to/from stand   Toilet Transfer: Minimal assistance;Ambulation;Comfort height toilet;Grab bars;RW   Toileting- Clothing Manipulation and Hygiene: Minimal assistance;Sit to/from stand       Functional mobility during ADLs: Minimal assistance;Rolling walker General ADL Comments: Pt fatigues quickly with activity      Vision         Perception     Praxis      Pertinent Vitals/Pain Pain Assessment: No/denies pain     Hand Dominance     Extremity/Trunk Assessment Upper Extremity Assessment Upper Extremity Assessment: Generalized weakness   Lower Extremity Assessment Lower Extremity Assessment: Defer to PT evaluation   Cervical / Trunk Assessment Cervical / Trunk Assessment: Kyphotic   Communication Communication Communication: No difficulties   Cognition Arousal/Alertness: Awake/alert Behavior During Therapy: WFL for tasks assessed/performed Overall Cognitive Status: Within Functional Limits for tasks assessed  General Comments  Discussed recommendations for SNF with pt and children.  Pt is considering.  Family is supportive of pt going to SNF     Exercises     Shoulder Instructions      Home Living Family/patient expects to be discharged to:: Unsure Living Arrangements: Children Available Help at Discharge: Family;Available PRN/intermittently Type of Home:  House Home Access: Stairs to enter [employer] CorporationEntrance Stairs-Number of Steps: 5 Entrance Stairs-Rails: Right;Left;Can reach both Home Layout: One level         Bathroom Toilet: Handicapped height Bathroom Accessibility: Yes   Home Equipment: Walker - 2 wheels;Bedside commode   Additional Comments: Pt is considering SNF       Prior Functioning/Environment Level of Independence: Needs assistance  Gait / Transfers Assistance Needed: Per son, pt has required increased assist at home "since sx ~3 wks ago" (assuming this is referring to LE stent placement in October); amb with RW and requires intermittent assist for balance. Pt reports her son has to help with bed mobility. Before all of this, pt mod indep with RW ADL's / Homemaking Assistance Needed: Over past 3 wks, pt has required supervision/assist for all ADLs            OT Problem List: Decreased strength;Decreased activity tolerance;Impaired balance (sitting and/or standing);Decreased safety awareness;Decreased knowledge of use of DME or AE;Obesity      OT Treatment/Interventions: Self-care/ADL training;Therapeutic exercise;DME and/or AE instruction;Energy conservation;Therapeutic activities;Patient/family education;Balance training    OT Goals(Current goals can be found in the care plan section) Acute Rehab OT Goals Patient Stated Goal: Return home OT Goal Formulation: With patient/family Time For Goal Achievement: 12/28/16 Potential to Achieve Goals: Good ADL Goals Pt Will Perform Grooming: with min guard assist;standing Pt Will Perform Upper Body Bathing: sitting;with set-up Pt Will Perform Lower Body Bathing: sit to/from stand;with min guard assist Pt Will Perform Upper Body Dressing: with supervision;sitting Pt Will Perform Lower Body Dressing: with min guard assist;sit to/from stand Pt Will Transfer to Toilet: with min guard assist Pt Will Perform Toileting - Clothing Manipulation and hygiene: with min guard assist  OT  Frequency: Min 2X/week   Barriers to D/C:            Co-evaluation              AM-PAC PT "6 Clicks" Daily Activity     Outcome Measure Help from another person eating meals?: None Help from another person taking care of personal grooming?: A Little Help from another person toileting, which includes using toliet, bedpan, or urinal?: A Little Help from another person bathing (including washing, rinsing, drying)?: A Lot Help from another person to put on and taking off regular upper body clothing?: A Little Help from another person to put on and taking off regular lower body clothing?: A Lot 6 Click Score: 17   End of Session Equipment Utilized During Treatment: Rolling walker Nurse Communication: Mobility status  Activity Tolerance: Patient limited by fatigue Patient left: in chair;with call bell/phone within reach;with chair alarm set;with family/visitor present  OT Visit Diagnosis: Unsteadiness on feet (R26.81)                Time: [id number] OT Time Calculation (min): 42 min Charges:  OT General Charges $OT Visit: 1 Visit OT Evaluation $OT Eval Moderate Complexity: 1 Mod OT Treatments $Therapeutic Activity: 23-37 mins G-Codes:     Reynolds AmericanWendi Natoshia Souter, OTR/L [phone]   Jeani HawkingConarpe, Rhesa Forsberg M 12/14/2016, 5:34 PM

## 2016-12-15 ENCOUNTER — Telehealth: Payer: Self-pay | Admitting: Vascular Surgery

## 2016-12-15 ENCOUNTER — Telehealth: Payer: Self-pay | Admitting: Internal Medicine

## 2016-12-15 DIAGNOSIS — I110 Hypertensive heart disease with heart failure: Secondary | ICD-10-CM | POA: Diagnosis not present

## 2016-12-15 DIAGNOSIS — Y838 Other surgical procedures as the cause of abnormal reaction of the patient, or of later complication, without mention of misadventure at the time of the procedure: Secondary | ICD-10-CM | POA: Diagnosis not present

## 2016-12-15 DIAGNOSIS — I509 Heart failure, unspecified: Secondary | ICD-10-CM | POA: Diagnosis not present

## 2016-12-15 DIAGNOSIS — I5021 Acute systolic (congestive) heart failure: Secondary | ICD-10-CM | POA: Diagnosis not present

## 2016-12-15 DIAGNOSIS — Z91013 Allergy to seafood: Secondary | ICD-10-CM | POA: Diagnosis not present

## 2016-12-15 DIAGNOSIS — S90932D Unspecified superficial injury of left great toe, subsequent encounter: Secondary | ICD-10-CM | POA: Diagnosis not present

## 2016-12-15 DIAGNOSIS — Z95820 Peripheral vascular angioplasty status with implants and grafts: Secondary | ICD-10-CM | POA: Diagnosis not present

## 2016-12-15 DIAGNOSIS — I480 Paroxysmal atrial fibrillation: Secondary | ICD-10-CM | POA: Diagnosis not present

## 2016-12-15 DIAGNOSIS — I739 Peripheral vascular disease, unspecified: Secondary | ICD-10-CM | POA: Diagnosis not present

## 2016-12-15 LAB — BASIC METABOLIC PANEL
ANION GAP: 7 (ref 5–15)
BUN: 30 mg/dL — AB (ref 6–20)
CALCIUM: 8.3 mg/dL — AB (ref 8.9–10.3)
CO2: 29 mmol/L (ref 22–32)
CREATININE: 1.01 mg/dL — AB (ref 0.44–1.00)
Chloride: 101 mmol/L (ref 101–111)
GFR calc Af Amer: 56 mL/min — ABNORMAL LOW (ref >60)
GFR, EST NON AFRICAN AMERICAN: 48 mL/min — AB (ref >60)
GLUCOSE: 181 mg/dL — AB (ref 65–99)
Potassium: 3.8 mmol/L (ref 3.5–5.1)
Sodium: 137 mmol/L (ref 135–145)

## 2016-12-15 LAB — GLUCOSE, CAPILLARY: Glucose-Capillary: 174 mg/dL — ABNORMAL HIGH (ref 65–99)

## 2016-12-15 MED ORDER — FUROSEMIDE 40 MG PO TABS: 80.0000 mg | ORAL_TABLET | Freq: Two times a day (BID) | ORAL | 0 refills | Status: AC

## 2016-12-15 NOTE — Progress Notes (Signed)
Visited with patient regarding move to palliative care.  Patient is fine and has strong support.    12/15/16 0908  Clinical Encounter Type  Visited With Patient;Family;Health care provider  Visit Type Initial;Psychological support;Spiritual support

## 2016-12-15 NOTE — Progress Notes (Signed)
Pt has orders to be discharged. Discharge instructions given and pt has no additional questions at this time. Medication regimen reviewed and pt educated. Pt verbalized understanding and has no additional questions. Telemetry box removed. IV removed and site in good condition. Pt stable and waiting for transportation. 

## 2016-12-15 NOTE — Care Management Note (Addendum)
Case Management Note  Patient Details  Name: Jean Moore MRN: [id number] Date of Birth: 11-01-1926  Subjective/Objective:   CHF                Action/Plan: Received call from Gustavo Lah stating that patient does not want to go to SNF at discharge and want to go home with [facility] services; [facility] choice offered, and she chose [facility]; Adacia with [facility] called for arrangements; No DME needed, she has a walker, cane and wheelchair at home; has private insurance with [employer] with prescription drug coverage; pharmacy of choice is [facility]; Attending MD please enter the face to face for [facility] services.  Expected Discharge Date:  12/16/16               Expected Discharge Plan:  Home w Home Health Services  Discharge planning Services  CM Consult  Choice offered to:  Patient, Adult Children  HH Arranged:  RN, Disease Management, PT, OT, Nurse's Aide HH Agency:  [facility]  Status of Service:  In process, will continue to follow  Reola Mosher [phone] 12/15/2016, 10:45 AM

## 2016-12-15 NOTE — Progress Notes (Signed)
Pt son refused vitals for patient. Pt is sleeping and would not like to be awakened for vitals only medications.  Son is staying with patient overnight.

## 2016-12-15 NOTE — Progress Notes (Signed)
   Subjective: No acute events overnight, she reports continued improvement in her breathing and lower extremity edema.  After discussing with her family, the patient would prefer home health physical therapy services rather than SNF.   Objective:  Vital signs in last 24 hours: Vitals:   12/14/16 1025 12/14/16 1200 12/14/16 2020 12/15/16 0500  BP: (!) 104/58 118/76 105/74 96/60  Pulse: 62 (!) 57 (!) 57 65  Resp: 18 20 18 18   Temp:  98.4 F (36.9 C)  98.5 F (36.9 C)  TempSrc:  Oral  Oral  SpO2: 97% 100% 100% 94%  Weight:    147 lb 3.2 oz (66.8 kg)  Height:       General: Elderly female resting in bed comfortably , no acute distress HEENT: Poor dentition CV: Systolic murmur, irregular rhythm Resp: Clear breath sounds, normal work of breathing, no distress  Abd: Soft, +BS, Non-tender Extr: Interval improvement in pitting edema to bilateral LEs Skin: Warm, dry     Assessment/Plan:   HFrEF exacerbation (EF 15%) Patient has history of heart failure with reduced ejection fraction and presented with signs of volume overload on exam with elevated BNP and edema on chest x-ray.  Her dry weight appears to be 140-145 pounds, though today weight is recorded at 144 still with fluid on exam.  She has received IV diuresis 40 mg twice daily, as well as a dose of 80 mg IV.  Palliative care consulted and will continue to work with patient regarding her treatment preferences going forward, appreciate their assistance. PT recommended SNF, though pt prefers home health services.  --PO Lasix regimen, to discharge with 80 mg BID-close f/u --Strict I/Os, daily weight  --BMP  --Cont home Metoprolol, digoxin --PT, ambulate pt   H/o PAD Patient received a stent to left lower extremity in October 2018, chronic wound to L great toe.  Vascular surgery was consulted in the ED and recommended continuing dual antiplatelet therapy and following up as outpatient.  H/o PAF Patient has a history of atrial  fibrillation currently not on anticoagulation due to patient preference, she is rate controlled with metoprolol. --Continue metoprolol    Dispo: Anticipated discharge in approximately 0 days.    Ginger Carne, MD 12/15/2016, 6:53 AM Pager: [phone]

## 2016-12-15 NOTE — Telephone Encounter (Signed)
Sched lab 01/06/17 at 4:00 and MD 02/02/17 at 1:45. Lm on hm#.

## 2016-12-15 NOTE — Telephone Encounter (Signed)
TOC HFU APPT [facility] WITH ACC ON 12/20/2016 PER DR HARDEN

## 2016-12-15 NOTE — Progress Notes (Signed)
Internal Medicine Attending:   I saw and examined the patient. I reviewed the resident's note and I agree with the resident's findings and plan as documented in the resident's note.  Patient feels well today with no new complaints.  Patient does state that her lower extremity swelling has improved but has not resolved.  Patient was initially admitted with worsening shortness of breath and found to have pulmonary edema on her chest x-ray as well as lower extremity swelling consistent with an acute on chronic systolic CHF exacerbation.  She is received IV diuresis for the last 2 days with resolution of her pulmonary symptoms but with some persistent lower extremity edema.  PT eval appreciated.  PT recommending SNF placement on discharge.  Discussed with patient and family at bedside who would like patient to go home with home PT.  Patient is stable for discharge home today with home PT.  We will discharge her on a higher dose of Lasix 80 mg twice daily (she is on 80 mg in the morning and 40 mg at night normally) and have her follow-up in internal medicine clinic for repeat BMP next week as well as titrating down her Lasix dose if she has improved to her normal home Lasix dose.

## 2016-12-15 NOTE — Clinical Social Work Note (Addendum)
CSW met with patient and her son at bedside. Discussed PT recommendation for SNF. Patient and her son have discussed it and prefer to return home with home health. RNCM notified.  CSW signing off. Consult again if any other social work needs arise.  Dayton Scrape, [place]

## 2016-12-15 NOTE — Discharge Summary (Signed)
Name: Jean Moore MRN: [id number] DOB: 1926/09/06 83 y.o. PCP: Nyra Market, MD  Date of Admission: 12/13/2016  3:46 PM Date of Discharge: 12/15/2016 Attending Physician: Dr. Heide Spark  Discharge Diagnosis: Active Problems:   Acute on chronic combined systolic and diastolic CHF (congestive heart failure) (HCC)   PAD (peripheral artery disease) (HCC)   Palliative care encounter   Discharge Medications: Allergies as of 12/15/2016      Reactions   Shrimp [shellfish Allergy] Other (See Comments)   Reaction: Hives, Itching, Bumps, Swelling      Medication List    TAKE these medications   acetaminophen 500 MG tablet Commonly known as:  TYLENOL Take 500 mg by mouth every 6 (six) hours as needed for pain.   aspirin 81 MG tablet Take 1 tablet (81 mg total) by mouth daily.   atorvastatin 40 MG tablet Commonly known as:  LIPITOR TAKE 1 TABLET BY MOUTH DAILY AT 6 PM. What changed:  See the new instructions.   CENTRUM SILVER 50+WOMEN PO Take 1 tablet daily by mouth.   clopidogrel 75 MG tablet Commonly known as:  PLAVIX Take 1 tablet (75 mg total) by mouth daily.   COMBIGAN 0.2-0.5 % ophthalmic solution Generic drug:  brimonidine-timolol Place 1 drop into both eyes 2 (two) times daily.   digoxin 0.125 MG tablet Commonly known as:  LANOXIN Take 1 tablet (0.125 mg total) by mouth daily.   dorzolamide 2 % ophthalmic solution Commonly known as:  TRUSOPT Place 1 drop into both eyes 2 (two) times daily.   furosemide 40 MG tablet Commonly known as:  LASIX Take 2 tablets (80 mg total) 2 (two) times daily by mouth. What changed:    how much to take  how to take this  when to take this  additional instructions   latanoprost 0.005 % ophthalmic solution Commonly known as:  XALATAN Place 1 drop into both eyes at bedtime.   metoprolol succinate 50 MG 24 hr tablet Commonly known as:  TOPROL-XL Take 1 tablet (50 mg total) by mouth daily. Take with or immediately  following a meal.   potassium chloride SA 20 MEQ tablet Commonly known as:  KLOR-CON M20 Take 1 tablet (20 mEq total) by mouth 2 (two) times daily.   SYSTANE ULTRA OP Place 1 drop into both eyes 4 (four) times daily as needed (dry eyes).       Disposition and follow-up:   Ms.Haylyn GOLA ARDITO was discharged from [facility] in Stable condition.  At the hospital follow up visit please address:  1.  -Assess fluid status, response to increased lasix regimen (80/40 to 80 BID) and check BMP (K, Cr).  -Adjust lasix dose as necessary  -Encourage attending f/u with vascular for PAD and L great toe wound and check status of podiatry referral (per wound care rec)   2.  Labs / imaging needed at time of follow-up: BMP  3.  Pending labs/ test needing follow-up: None  Follow-up Appointments: Follow-up Information    [facility] Follow up.   Specialty:  Home Health Services Why:  They will do your home health care at your home Contact information: [address] [place] [address] [phone]           Hospital Course by problem list:   Acute HFrEF Exacerbation (EF 15%) Patient has a history of heart failure with reduced ejection fraction and presented with worsening shortness of breath and lower extremity  edema found to be volume overloaded with elevated BNP, pulmonary edema on chest x-ray.  She did not require supplemental oxygen and maintained O2 saturation on room air for the duration of admission.  She received IV diuresis with good urine output and improvement in her breathing and lower extremity edema.  She was transitioned to oral diuretics and discharged on an increased Lasix dose of 80 mg twice daily (previously 80 mg a.m., 40 p.m.) with close follow-up in the [facility] clinic.  PT recommended SNF, however due to patient preference she was discharged with home health PT services.  Palliative care also evaluated the patient and will  follow as an outpatient.  H/o Peripheral Artery Disease, L Great Toe wound Patient has history of peripheral arterial disease status post left lower extremity stent in October 2018 with associated wound on L great toe.  Vascular surgery was consulted in the ED and recommended routine outpatient follow-up.  Wound care also evaluated patient and recommended outpatient follow-up with podiatrist, referral placed.  H/o PAF Patient has a history of atrial fibrillation, rate controlled with metoprolol and not on anticoagulation per patient preference.  She was in atrial fibrillation during the admission with rate well controlled on her home metoprolol.    Discharge Vitals:   BP 103/68 (BP Location: Right Arm)   Pulse 64   Temp (!) 97.5 F (36.4 C) (Oral)   Resp 18   Ht 5\' 3"  (1.6 m)   Wt 147 lb 3.2 oz (66.8 kg)   LMP 10/12/1978   SpO2 99%   BMI 26.08 kg/m   Pertinent Labs, Studies, and Procedures:  BMP Latest Ref Rng & Units 12/15/2016 12/14/2016 12/13/2016  Glucose 65 - 99 mg/dL 161(W181(H) 960(A186(H) -  BUN 6 - 20 mg/dL 54(U30(H) 98(J33(H) -  Creatinine 0.44 - 1.00 mg/dL 1.91(Y1.01(H) 7.82(N1.10(H) 5.621.00  BUN/Creat Ratio 12 - 28 - - -  Sodium 135 - 145 mmol/L 137 138 -  Potassium 3.5 - 5.1 mmol/L 3.8 4.4 -  Chloride 101 - 111 mmol/L 101 102 -  CO2 22 - 32 mmol/L 29 29 -  Calcium 8.9 - 10.3 mg/dL 8.3(L) 8.4(L) -     Discharge Instructions: Discharge Instructions    Ambulatory referral to Podiatry   Complete by:  As directed    Podiatry referral per wound care recommendations for L great toe wound   Diet - low sodium heart healthy   Complete by:  As directed    Discharge instructions   Complete by:  As directed    It was a pleasure to take care of you Ms. Lowell GuitarPowell.  -Take 80 mg of Lasix twice a day (two 40 mg pills in the morning and two in the afternoon) -We arranged for home health services to come to your home and work with you  -We made a [facility] appointment [facility] on  Tuesday 11/13 at 10:45 am to check how you are doing with your fluid. -We also put in a referral for the podiatrist based on the recommendations of the wound care team -You also have a follow up appointments with vascular surgery and they will contact you for the specific date/time      Signed: Ginger CarneHarden, Jennings Corado, MD 12/15/2016, 3:55 PM   Pager: [phone]

## 2016-12-15 NOTE — Telephone Encounter (Signed)
-----   Message from Sharee Pimple, RN sent at 12/14/2016 11:37 AM EST ----- Regarding: appt 3-4 weeks w/ ABIs See Dr. Randie Heinz   ----- Message ----- From: Chuck Hint, MD Sent: 12/13/2016   6:49 PM To: Vvs Charge Pool Subject: charge                                         Level 3 consult from the emergency department.  She will need a follow-up visit with Dr. Bo Mcclintock in 3-4 weeks with ABIs at that time. Thank you CD

## 2016-12-16 ENCOUNTER — Other Ambulatory Visit: Payer: Self-pay

## 2016-12-16 DIAGNOSIS — I739 Peripheral vascular disease, unspecified: Secondary | ICD-10-CM

## 2016-12-19 ENCOUNTER — Telehealth: Payer: Self-pay

## 2016-12-19 NOTE — Telephone Encounter (Signed)
Transition Care Management Follow-up Telephone Call   Date discharged? 12/13/16   How have you been since you were released from the hospital? ( family answered the phone, stated patient is doing well)   Do you understand why you were in the hospital?   Do you understand the discharge instructions?    Where were you discharged to? home   Items Reviewed:  Medications reviewed:   Allergies reviewed:   Dietary changes reviewed:  Referrals reviewed:    Functional Questionnaire:   Activities of Daily Living (ADLs):   She states they are independent in the following:  States they require assistance with the following:    Any transportation issues/concerns?: No   Any patient concerns?  per family patient still has some swelling to legs but much better.   Confirmed importance and date/time of follow-up visits scheduled YES ( family aware of appt & stated will bring patient)  Provider Appointment booked with  Confirmed with patient if condition begins to worsen call PCP or go to the ER.  Patient was given the office number and encouraged to call back with question or concerns.  : yes

## 2016-12-19 NOTE — Telephone Encounter (Signed)
Michelle with [facility] requesting VO. Please call pt back.

## 2016-12-20 ENCOUNTER — Ambulatory Visit (INDEPENDENT_AMBULATORY_CARE_PROVIDER_SITE_OTHER): Payer: Medicare Other | Admitting: Internal Medicine

## 2016-12-20 ENCOUNTER — Ambulatory Visit: Payer: Medicare Other

## 2016-12-20 ENCOUNTER — Encounter: Payer: Self-pay | Admitting: Internal Medicine

## 2016-12-20 ENCOUNTER — Encounter (INDEPENDENT_AMBULATORY_CARE_PROVIDER_SITE_OTHER): Payer: Self-pay

## 2016-12-20 ENCOUNTER — Other Ambulatory Visit: Payer: Self-pay

## 2016-12-20 VITALS — BP 116/75 | HR 66 | Temp 98.0°F | Ht 63.0 in | Wt 145.0 lb

## 2016-12-20 DIAGNOSIS — I5022 Chronic systolic (congestive) heart failure: Secondary | ICD-10-CM | POA: Diagnosis not present

## 2016-12-20 DIAGNOSIS — I110 Hypertensive heart disease with heart failure: Secondary | ICD-10-CM | POA: Diagnosis not present

## 2016-12-20 DIAGNOSIS — L97529 Non-pressure chronic ulcer of other part of left foot with unspecified severity: Secondary | ICD-10-CM | POA: Diagnosis not present

## 2016-12-20 DIAGNOSIS — L97519 Non-pressure chronic ulcer of other part of right foot with unspecified severity: Secondary | ICD-10-CM | POA: Diagnosis not present

## 2016-12-20 DIAGNOSIS — L97919 Non-pressure chronic ulcer of unspecified part of right lower leg with unspecified severity: Secondary | ICD-10-CM

## 2016-12-20 DIAGNOSIS — E785 Hyperlipidemia, unspecified: Secondary | ICD-10-CM | POA: Diagnosis not present

## 2016-12-20 DIAGNOSIS — L97929 Non-pressure chronic ulcer of unspecified part of left lower leg with unspecified severity: Secondary | ICD-10-CM | POA: Diagnosis not present

## 2016-12-20 DIAGNOSIS — Z79899 Other long term (current) drug therapy: Secondary | ICD-10-CM | POA: Diagnosis not present

## 2016-12-20 NOTE — Assessment & Plan Note (Signed)
Persistent wound on left great toe. No signs of active infection. Concerning for ischemic ulcer with her underlying PAD. She has had recent intervention in October from vascular surgery. Evaluated during recent hospital stay with recommendation of outpatient follow up which is scheduled on 01/13/17.

## 2016-12-20 NOTE — Progress Notes (Signed)
   CC: HFU for HFrEF exacerbation  HPI:  Jean Moore is a 83 y.o. female with a past medical history listed below here today for follow up of her recent hospitalization for heart failure exacerbation (EF 15%).  She recently presented to the [facility] on 11/6 with complaints of shortness of breath and lower extremity edema. Found to be volume overloaded with elevated BNP and pulmonary edema noted on CXR. She received IV diuresis with good response and improvement of her symptoms. She was discharged on a regimen of Lasix 80 mg bid (previously 80 qam, 40 qpm). Discharge weight was 147 lbs. No clear established dry weight in the chart (ranged from 140-150). SNF placement was recommended but patient declined. She was also noted to have a wound on her left great toe. She has a history of PAD s/p LLE stenting 11/2016. Vascular was consulted and recommended outpatient follow up. Does appear to have follow up scheduled on 12/7 with vascular surgery.   Today she reports she is doing well since leaving the hospital. She reports she continues to have dyspnea with exertion but none at rest. Believe LE swelling is improved. No orthopnea (reports 2-3 pillows at night, stable, but no SOB) or PND. Weight is 145 lbs today in clinic. She is currently taking metoprolol 50 mg daily, digoxin 0.125 mg daily, and lasix 80 mg bid. Most recent ECHO 03/17/16 with an EF of 15% and sever AS. Last follow up with Cardiology on 04/2016. Has refused AICD consideration or AC for her chronic a.fib/flutter.  Palliative care was consulted during hospitalization and will continue to follow outpatient.   Past Medical History:  Diagnosis Date  . Atrial fibrillation (HCC)    on coumadin since 2012, recently stopped because of vaginal bleeding  . CHF (congestive heart failure) (HCC)   . Hyperlipidemia   . Hypertension   . Routine adult health maintenance 04/09/2012   Review of Systems:   No chest pain or shortness of reath  Physical  Exam:  Vitals:   12/20/16 1503  BP: 116/75  Pulse: 66  Temp: 98 F (36.7 C)  TempSrc: Oral  SpO2: 99%  Weight: 145 lb (65.8 kg)  Height: 5\' 3"  (1.6 m)   Physical Exam  Constitutional:  Chronically ill appearing, frail female in no acute distress  HENT:  Head: Normocephalic and atraumatic.  Cardiovascular: Normal rate. An irregularly irregular rhythm present.  Murmur heard.  Systolic murmur is present. Pulmonary/Chest: Effort normal and breath sounds normal. She has no wheezes. She has no rales.  Abdominal: Soft. Bowel sounds are normal.  Musculoskeletal:  2+ pitting edema to shins bilaterally  Skin: Skin is warm and dry.  Chronic wound over left great toe, no erythema, warmth, pus/drainage, or tenderness  Psychiatric: Mood and affect normal.     Assessment & Plan:   See Encounters Tab for problem based charting.  Patient discussed with Dr. Oswaldo Done

## 2016-12-20 NOTE — Telephone Encounter (Signed)
Went straight to vmail, lm for rtc 

## 2016-12-20 NOTE — Patient Instructions (Signed)
Jean Moore,  I am glad you are feeling better. Lets continue to take the Lasix 80 mg twice a day. Please start weighing yourself at home. If you notice your weight going up by 3 pounds in a day or 5 pounds in a week, start having worsening shortness of breath or swelling then please call the clinic.  I would like to see you back in 2 weeks for follow up.

## 2016-12-20 NOTE — Assessment & Plan Note (Addendum)
Appears to still have some room for diuresis today. Reports NHYA Class III symptoms today (no symptoms at rest but does have symptoms with any exertion). Appears to have stable 2-3 pillow orthopnea. Lungs are clear today.   Will re-check BMET today. Continue increased diuretic dose at lasix 80 mg bid and continue metoprolol 50 mg daily and digoxin 0.125 mg daily. Will place referral to palliative care which she is agreeable to today. Recommended checking daily weights at home. Follow up in 2 weeks for reassessment of volume status and adjustment in lasix dose.

## 2016-12-21 LAB — BMP8+ANION GAP
ANION GAP: 13 mmol/L (ref 10.0–18.0)
BUN/Creatinine Ratio: 30 — ABNORMAL HIGH (ref 12–28)
BUN: 28 mg/dL — ABNORMAL HIGH (ref 8–27)
CALCIUM: 8.7 mg/dL (ref 8.7–10.3)
CHLORIDE: 97 mmol/L (ref 96–106)
CO2: 32 mmol/L — AB (ref 20–29)
Creatinine, Ser: 0.92 mg/dL (ref 0.57–1.00)
GFR calc Af Amer: 64 mL/min/{1.73_m2} (ref >59)
GFR, EST NON AFRICAN AMERICAN: 56 mL/min/{1.73_m2} — AB (ref >59)
GLUCOSE: 183 mg/dL — AB (ref 65–99)
POTASSIUM: 4.7 mmol/L (ref 3.5–5.2)
SODIUM: 142 mmol/L (ref 134–144)

## 2016-12-21 NOTE — Progress Notes (Signed)
Internal Medicine Clinic Attending  Case discussed with Dr. Boswell at the time of the visit.  We reviewed the resident's history and exam and pertinent patient test results.  I agree with the assessment, diagnosis, and plan of care documented in the resident's note.  

## 2016-12-22 ENCOUNTER — Telehealth: Payer: Self-pay | Admitting: *Deleted

## 2016-12-22 ENCOUNTER — Telehealth: Payer: Self-pay | Admitting: Interventional Cardiology

## 2016-12-22 NOTE — Telephone Encounter (Signed)
ginny from [facility] calls and wants to know why pt is not on lisinopril??? rtc to [phone], lm for rtc

## 2016-12-22 NOTE — Telephone Encounter (Signed)
Jean Moore with [facility]  [employer]  [phone] ex [phone]  Would like to know why lisinopril was stopped please.  Please give her a call leave a message if need be or please fax a note to   Fax [phone]+                                               [phone]

## 2016-12-23 ENCOUNTER — Telehealth: Payer: Self-pay | Admitting: *Deleted

## 2016-12-23 NOTE — Telephone Encounter (Signed)
Jean Moore [facility] PT calls for

## 2016-12-26 NOTE — Telephone Encounter (Signed)
Hi Jean Moore I cannot give out this information. This will need to release by a RN.

## 2016-12-26 NOTE — Telephone Encounter (Signed)
Spoke with Antony Contras at [facility] and reviewed Lisinopril being stopped 04/2015.  Antony Contras was trying to update medication list in their system.  Antony Contras appreciative for call.

## 2016-12-28 ENCOUNTER — Emergency Department (HOSPITAL_COMMUNITY): Payer: Medicare Other

## 2016-12-28 ENCOUNTER — Emergency Department (HOSPITAL_COMMUNITY)
Admission: EM | Admit: 2016-12-28 | Discharge: 2016-12-28 | Disposition: A | Discharge status: Home / Self Care | Payer: Medicare Other | Attending: Emergency Medicine | Admitting: Emergency Medicine

## 2016-12-28 ENCOUNTER — Encounter (HOSPITAL_COMMUNITY): Payer: Self-pay

## 2016-12-28 ENCOUNTER — Other Ambulatory Visit: Payer: Self-pay

## 2016-12-28 DIAGNOSIS — M25552 Pain in left hip: Secondary | ICD-10-CM | POA: Diagnosis present

## 2016-12-28 DIAGNOSIS — Z79899 Other long term (current) drug therapy: Secondary | ICD-10-CM | POA: Diagnosis not present

## 2016-12-28 DIAGNOSIS — I5023 Acute on chronic systolic (congestive) heart failure: Secondary | ICD-10-CM | POA: Diagnosis not present

## 2016-12-28 DIAGNOSIS — K0889 Other specified disorders of teeth and supporting structures: Secondary | ICD-10-CM | POA: Diagnosis not present

## 2016-12-28 DIAGNOSIS — Z9181 History of falling: Secondary | ICD-10-CM

## 2016-12-28 DIAGNOSIS — I739 Peripheral vascular disease, unspecified: Secondary | ICD-10-CM

## 2016-12-28 DIAGNOSIS — W010XXA Fall on same level from slipping, tripping and stumbling without subsequent striking against object, initial encounter: Secondary | ICD-10-CM | POA: Diagnosis not present

## 2016-12-28 DIAGNOSIS — I110 Hypertensive heart disease with heart failure: Secondary | ICD-10-CM

## 2016-12-28 DIAGNOSIS — S0990XA Unspecified injury of head, initial encounter: Secondary | ICD-10-CM | POA: Insufficient documentation

## 2016-12-28 DIAGNOSIS — Y939 Activity, unspecified: Secondary | ICD-10-CM | POA: Insufficient documentation

## 2016-12-28 DIAGNOSIS — E785 Hyperlipidemia, unspecified: Secondary | ICD-10-CM

## 2016-12-28 DIAGNOSIS — R011 Cardiac murmur, unspecified: Secondary | ICD-10-CM

## 2016-12-28 DIAGNOSIS — M25562 Pain in left knee: Secondary | ICD-10-CM | POA: Diagnosis not present

## 2016-12-28 DIAGNOSIS — I5022 Chronic systolic (congestive) heart failure: Secondary | ICD-10-CM | POA: Diagnosis not present

## 2016-12-28 DIAGNOSIS — I4891 Unspecified atrial fibrillation: Secondary | ICD-10-CM

## 2016-12-28 DIAGNOSIS — Z8249 Family history of ischemic heart disease and other diseases of the circulatory system: Secondary | ICD-10-CM

## 2016-12-28 DIAGNOSIS — Z7902 Long term (current) use of antithrombotics/antiplatelets: Secondary | ICD-10-CM | POA: Diagnosis not present

## 2016-12-28 DIAGNOSIS — Z7982 Long term (current) use of aspirin: Secondary | ICD-10-CM

## 2016-12-28 DIAGNOSIS — R0602 Shortness of breath: Secondary | ICD-10-CM | POA: Diagnosis not present

## 2016-12-28 DIAGNOSIS — Z7901 Long term (current) use of anticoagulants: Secondary | ICD-10-CM | POA: Diagnosis not present

## 2016-12-28 DIAGNOSIS — I119 Hypertensive heart disease without heart failure: Secondary | ICD-10-CM | POA: Insufficient documentation

## 2016-12-28 DIAGNOSIS — Z91013 Allergy to seafood: Secondary | ICD-10-CM

## 2016-12-28 DIAGNOSIS — Y999 Unspecified external cause status: Secondary | ICD-10-CM | POA: Diagnosis not present

## 2016-12-28 DIAGNOSIS — Y92008 Other place in unspecified non-institutional (private) residence as the place of occurrence of the external cause: Secondary | ICD-10-CM | POA: Insufficient documentation

## 2016-12-28 DIAGNOSIS — M7918 Myalgia, other site: Secondary | ICD-10-CM

## 2016-12-28 LAB — COMPREHENSIVE METABOLIC PANEL
ALT: 35 U/L (ref 14–54)
AST: 38 U/L (ref 15–41)
Albumin: 2.9 g/dL — ABNORMAL LOW (ref 3.5–5.0)
Alkaline Phosphatase: 61 U/L (ref 38–126)
Anion gap: 8 (ref 5–15)
BILIRUBIN TOTAL: 1.5 mg/dL — AB (ref 0.3–1.2)
BUN: 29 mg/dL — AB (ref 6–20)
CHLORIDE: 99 mmol/L — AB (ref 101–111)
CO2: 33 mmol/L — ABNORMAL HIGH (ref 22–32)
CREATININE: 1.12 mg/dL — AB (ref 0.44–1.00)
Calcium: 8.9 mg/dL (ref 8.9–10.3)
GFR, EST AFRICAN AMERICAN: 49 mL/min — AB (ref >60)
GFR, EST NON AFRICAN AMERICAN: 43 mL/min — AB (ref >60)
Glucose, Bld: 177 mg/dL — ABNORMAL HIGH (ref 65–99)
Potassium: 4.4 mmol/L (ref 3.5–5.1)
Sodium: 140 mmol/L (ref 135–145)
TOTAL PROTEIN: 6.4 g/dL — AB (ref 6.5–8.1)

## 2016-12-28 LAB — CBC WITH DIFFERENTIAL/PLATELET
Basophils Absolute: 0 10*3/uL (ref 0.0–0.1)
Basophils Relative: 1 %
EOS ABS: 0.1 10*3/uL (ref 0.0–0.7)
EOS PCT: 3 %
HEMATOCRIT: 37.4 % (ref 36.0–46.0)
Hemoglobin: 11.6 g/dL — ABNORMAL LOW (ref 12.0–15.0)
LYMPHS ABS: 1.2 10*3/uL (ref 0.7–4.0)
LYMPHS PCT: 26 %
MCH: 28.2 pg (ref 26.0–34.0)
MCHC: 31 g/dL (ref 30.0–36.0)
MCV: 91 fL (ref 78.0–100.0)
MONOS PCT: 6 %
Monocytes Absolute: 0.3 10*3/uL (ref 0.1–1.0)
Neutro Abs: 3 10*3/uL (ref 1.7–7.7)
Neutrophils Relative %: 65 %
PLATELETS: 75 10*3/uL — AB (ref 150–400)
RBC: 4.11 MIL/uL (ref 3.87–5.11)
RDW: 16.2 % — AB (ref 11.5–15.5)
WBC: 4.7 10*3/uL (ref 4.0–10.5)

## 2016-12-28 LAB — BRAIN NATRIURETIC PEPTIDE: B NATRIURETIC PEPTIDE 5: 4213.8 pg/mL — AB (ref 0.0–100.0)

## 2016-12-28 LAB — I-STAT TROPONIN, ED: Troponin i, poc: 0.14 ng/mL (ref 0.00–0.08)

## 2016-12-28 LAB — TROPONIN I: Troponin I: 0.08 ng/mL (ref <0.03)

## 2016-12-28 LAB — I-STAT CG4 LACTIC ACID, ED: LACTIC ACID, VENOUS: 1.42 mmol/L (ref 0.5–1.9)

## 2016-12-28 MED ORDER — ACETAMINOPHEN 500 MG PO TABS: 1000.0000 mg | ORAL_TABLET | Freq: Once | ORAL | Status: DC

## 2016-12-28 MED ORDER — FUROSEMIDE 10 MG/ML IJ SOLN
40.0000 mg | Freq: Once | INTRAMUSCULAR | Status: AC
  Administered 2016-12-28: 40 mg via INTRAVENOUS
  Filled 2016-12-28: qty 4

## 2016-12-28 MED ORDER — SODIUM CHLORIDE 0.9 % IV BOLUS (SEPSIS): 500.0000 mL | Freq: Once | INTRAVENOUS | Status: DC

## 2016-12-28 NOTE — ED Provider Notes (Signed)
[facility] Provider Note   CSN: [id number] Arrival date & time: 12/28/16  [id number]     History   Chief Complaint Chief Complaint  Patient presents with  . Fall    HPI Jean Moore is a 83 y.o. female.  HPI  83 year old female with past medical history as below who presents with fall.  The patient states that she was walking to her living room yesterday when she tripped on a carpet.  She fell directly onto her left hip and hit her head.  There is no loss conscious.  She is able to get herself up.  Since then, she has had persistent worsening left hip and knee pain that is painful when walking.  She denies any significant pain at rest.  Of note, she has had some increased lower extremity edema and was recently hospitalized for heart failure.  She denies any chest pain but also feels slightly more short of breath with exertion.  Denies any other complaints.  No fevers or chills.  She denies any headache.  Denies any neck pain or arm or leg numbness or tingling.  Of note, she is on Plavix and has been taking it as prescribed.  Denies any palpitations.  Past Medical History:  Diagnosis Date  . Atrial fibrillation (HCC)    on coumadin since 2012, recently stopped because of vaginal bleeding  . CHF (congestive heart failure) (HCC)   . Hyperlipidemia   . Hypertension   . Routine adult health maintenance 04/09/2012    Patient Active Problem List   Diagnosis Date Noted  . PAD (peripheral artery disease) (HCC)   . Palliative care encounter   . Acute on chronic combined systolic and diastolic CHF (congestive heart failure) (HCC) 12/13/2016  . Skin ulcers of both feet (HCC) 10/05/2016  . Sleep-wake cycle disorder 10/05/2016  . Urge incontinence 06/23/2016  . Toenail torn away 06/23/2016  . Hoarseness 05/19/2016  . Tinea pedis 05/05/2016  . Severe aortic stenosis 04/14/2016  . Encounter for end of life care 05/07/2015  . DNR (do not resuscitate)  05/07/2015  . Multinodular goiter 08/21/2014  . Stenosis of right internal carotid artery 05/07/2014  . Cataract 05/15/2013  . Paroxysmal atrial fibrillation (HCC) 04/09/2012  . Chronic systolic congestive heart failure (HCC) 04/09/2012  . Routine adult health maintenance 04/09/2012  . Essential hypertension 03/08/2007    Past Surgical History:  Procedure Laterality Date  . ABDOMINAL AORTOGRAM W/LOWER EXTREMITY N/A 10/26/2016   Procedure: ABDOMINAL AORTOGRAM W/LOWER EXTREMITY;  Surgeon: Maeola Harmanain, Brandon Christopher, MD;  [facility];  Service: Cardiovascular;  Laterality: N/A;  . NO PAST SURGERIES    . PERIPHERAL VASCULAR BALLOON ANGIOPLASTY Left 11/14/2016   Procedure: PERIPHERAL VASCULAR BALLOON ANGIOPLASTY;  Surgeon: Maeola Harmanain, Brandon Christopher, MD;  [facility];  Service: Cardiovascular;  Laterality: Left;  Posterior tibial    OB History    No data available       Home Medications    Prior to Admission medications   Medication Sig Start Date End Date Taking? Authorizing Provider  acetaminophen (TYLENOL) 500 MG tablet Take 500 mg by mouth every 6 (six) hours as needed for pain.   Yes [provider]  aspirin 81 MG tablet Take 1 tablet (81 mg total) by mouth daily. 05/06/13  Yes Clegg, Amy D, NP  atorvastatin (LIPITOR) 40 MG tablet TAKE 1 TABLET BY MOUTH DAILY AT 6 PM. Patient taking differently: TAKE 1 TABLET BY MOUTH DAILY  11/14/16  Yes Bensimhon, Bevelyn Buckles, MD  clopidogrel (PLAVIX) 75 MG tablet Take 1 tablet (75 mg total) by mouth daily. 11/14/16 11/14/17 Yes Maeola Harman, MD  COMBIGAN 0.2-0.5 % ophthalmic solution Place 1 drop into both eyes 2 (two) times daily. 09/20/16  Yes [provider]  digoxin (LANOXIN) 0.125 MG tablet Take 1 tablet (0.125 mg total) by mouth daily. 04/14/16  Yes Lyn Records, MD  dorzolamide (TRUSOPT) 2 % ophthalmic solution Place 1 drop into both eyes 2 (two) times daily. 09/22/16  Yes [provider]  furosemide (LASIX) 40 MG tablet Take 2 tablets (80 mg total) 2 (two) times daily by mouth. 12/15/16  Yes Ginger Carne, MD  latanoprost (XALATAN) 0.005 % ophthalmic solution Place 1 drop into both eyes at bedtime. 09/20/16  Yes [provider]  Menthol (ICY HOT BACK) 5 % PTCH Apply 1 patch topically as needed (pain).   Yes [provider]  metoprolol succinate (TOPROL-XL) 50 MG 24 hr tablet Take 1 tablet (50 mg total) by mouth daily. Take with or immediately following a meal. 04/10/15  Yes Selina Cooley, MD  Multiple Vitamins-Minerals (CENTRUM SILVER 50+WOMEN PO) Take 1 tablet daily by mouth.   Yes [provider]  Polyethyl Glycol-Propyl Glycol (SYSTANE ULTRA OP) Place 1 drop into both eyes 4 (four) times daily as needed (dry eyes).   Yes [provider]  potassium chloride SA (KLOR-CON M20) 20 MEQ tablet Take 1 tablet (20 mEq total) by mouth 2 (two) times daily. 12/10/15  Yes Eulah Pont, MD    Family History Family History  Problem Relation Age of Onset  . Hypertension Mother   . Hypertension Father   . Thyroid disease Neg Hx     Social History Social History   Tobacco Use  . Smoking status: Never Smoker  . Smokeless tobacco: Never Used  Substance Use Topics  . Alcohol use: No    Alcohol/week: 0.0 oz  . Drug use: No     Allergies   Shrimp [shellfish allergy]   Review of Systems Review of Systems  Constitutional: Positive for fatigue.  Musculoskeletal: Positive for arthralgias and gait problem.  All other systems reviewed and are negative.    Physical Exam Updated Vital Signs BP (!) 85/67   Pulse (!) 153   Temp (!) 97.4 F (36.3 C) (Rectal)   Resp (!) 27   LMP 10/12/1978   SpO2 (!) 57%   Physical Exam  Constitutional: She is oriented to person, place, and time. She appears well-developed and well-nourished. No distress.  HENT:  Head: Normocephalic and atraumatic.  No apparent head trauma  Eyes: Conjunctivae are  normal.  Neck: Neck supple.  No midline or paracervical neck tenderness  Cardiovascular: Normal rate, regular rhythm and normal heart sounds. Exam reveals no friction rub.  No murmur heard. Pulmonary/Chest: Effort normal and breath sounds normal. No respiratory distress. She has no wheezes. She has no rales.  Abdominal: She exhibits no distension.  Musculoskeletal: She exhibits no edema.  Neurological: She is alert and oriented to person, place, and time. She exhibits normal muscle tone.  Skin: Skin is warm. Capillary refill takes less than 2 seconds.  Psychiatric: She has a normal mood and affect.  Nursing note and vitals reviewed.   LOWER EXTREMITY EXAM: LEFT  INSPECTION & PALPATION: Mild TTP over left anterior hip and knee. No bruising or deformity. No pain with passive ROM or log roll of leg.  SENSORY: sensation is intact to light  touch in:  Superficial peroneal nerve distribution (over dorsum of foot) Deep peroneal nerve distribution (over first dorsal web space) Sural nerve distribution (over lateral aspect 5th metatarsal) Saphenous nerve distribution (over medial instep)  MOTOR:  + Motor EHL (great toe dorsiflexion) + FHL (great toe plantar flexion)  + TA (ankle dorsiflexion)  + GSC (ankle plantar flexion)  VASCULAR: 2+ dorsalis pedis and posterior tibialis pulses Capillary refill < 2 sec, toes warm and well-perfused  COMPARTMENTS: Soft, warm, well-perfused No pain with passive extension No parethesias    ED Treatments / Results  Labs (all labs ordered are listed, but only abnormal results are displayed) Labs Reviewed  CBC WITH DIFFERENTIAL/PLATELET - Abnormal; Notable for the following components:      Result Value   Hemoglobin 11.6 (*)    RDW 16.2 (*)    Platelets 75 (*)    All other components within normal limits  COMPREHENSIVE METABOLIC PANEL - Abnormal; Notable for the following components:   Chloride 99 (*)    CO2 33 (*)    Glucose, Bld 177 (*)     BUN 29 (*)    Creatinine, Ser 1.12 (*)    Total Protein 6.4 (*)    Albumin 2.9 (*)    Total Bilirubin 1.5 (*)    GFR calc non Af Amer 43 (*)    GFR calc Af Amer 49 (*)    All other components within normal limits  BRAIN NATRIURETIC PEPTIDE - Abnormal; Notable for the following components:   B Natriuretic Peptide 4,213.8 (*)    All other components within normal limits  TROPONIN I - Abnormal; Notable for the following components:   Troponin I 0.08 (*)    All other components within normal limits  I-STAT TROPONIN, ED - Abnormal; Notable for the following components:   Troponin i, poc 0.14 (*)    All other components within normal limits  I-STAT CG4 LACTIC ACID, ED    EKG  EKG Interpretation  Date/Time:  Wednesday December 28 2016 10:55:23 EST Ventricular Rate:  67 PR Interval:    QRS Duration: 140 QT Interval:  425 QTC Calculation: 449 R Axis:   -107 Text Interpretation:  Atrial fibrillation Nonspecific IVCD with LAD Abnormal lateral Q waves Anterior infarct, old No significant change since last tracing Confirmed by Shaune Pollack [phone]) on 12/28/2016 10:57:41 AM       Radiology Ct Head Wo Contrast  Result Date: 12/28/2016 CLINICAL DATA:  Altered level of consciousness EXAM: CT HEAD WITHOUT CONTRAST TECHNIQUE: Contiguous axial images were obtained from the base of the skull through the vertex without intravenous contrast. COMPARISON:  None. FINDINGS: Brain: There is atrophy and chronic small vessel disease changes. No acute intracranial abnormality. Specifically, no hemorrhage, hydrocephalus, mass lesion, acute infarction, or significant intracranial injury. Vascular: No hyperdense vessel. Extensive calcifications in the vessels of the skullbase. Skull: No acute calvarial abnormality. Sinuses/Orbits: Visualized paranasal sinuses and mastoids clear. Orbital soft tissues unremarkable. Other: None IMPRESSION: No acute intracranial abnormality. Atrophy, chronic microvascular  disease. Electronically Signed   By: Charlett Nose M.D.   On: 12/28/2016 11:21   Dg Chest Portable 1 View  Result Date: 12/28/2016 CLINICAL DATA:  Hypoxemia, low blood pressure, fell last night, history hypertension, CHF, atrial fibrillation EXAM: PORTABLE CHEST 1 VIEW COMPARISON:  Portable exam 1006 hours compared to 12/13/2016 FINDINGS: Persistent marked enlargement of cardiac silhouette. Pericardial effusion not excluded with this configuration. Atherosclerotic calcification aorta. Mediastinal contours and pulmonary vascularity normal. Lungs grossly clear with suspected  underlying hyperinflation. No acute infiltrate, pleural effusion or pneumothorax. Bones demineralized. IMPRESSION: Persistent marked enlargement of cardiac silhouette, pericardial effusion not excluded. Suspected emphysematous changes without acute infiltrate. Electronically Signed   By: Ulyses Southward M.D.   On: 12/28/2016 10:15   Dg Knee Complete 4 Views Left  Result Date: 12/28/2016 CLINICAL DATA:  Fall last night, left anterior knee pain, swelling EXAM: LEFT KNEE - COMPLETE 4+ VIEW COMPARISON:  None. FINDINGS: Mild degenerative changes in the left knee, best seen in the patellofemoral compartment. No acute bony abnormality. Specifically, no fracture, subluxation, or dislocation. Soft tissues are intact. IMPRESSION: No acute bony abnormality. Electronically Signed   By: Charlett Nose M.D.   On: 12/28/2016 11:32   Dg Hip Unilat With Pelvis 2-3 Views Left  Result Date: 12/28/2016 CLINICAL DATA:  Left hip pain secondary to a fall last night. EXAM: DG HIP (WITH OR WITHOUT PELVIS) 2-3V LEFT COMPARISON:  None. FINDINGS: There is no evidence of hip fracture or dislocation. Osteopenia. Degenerative calcifications in the soft tissues adjacent to the left greater trochanter consistent with calcific tendinopathy. Aortic and iliac atherosclerosis. IMPRESSION: No acute abnormality. Aortic Atherosclerosis (ICD10-I70.0). Electronically Signed   By:  Francene Boyers M.D.   On: 12/28/2016 10:14    Procedures Procedures (including critical care time)  Medications Ordered in ED Medications  furosemide (LASIX) injection 40 mg (40 mg Intravenous Given 12/28/16 1308)     Initial Impression / Assessment and Plan / ED Course  I have reviewed the triage vital signs and the nursing notes.  Pertinent labs & imaging results that were available during my care of the patient were reviewed by me and considered in my medical decision making (see chart for details).     83 yo F with PMhx as above here with mechanical fall and subsequent mild left knee and hip pain. Plain films negative. Pt ambulatory prior to and in ED - doubt occult fracture. She has no other signs of trauma. CT head is negative. Given her age and edema on exam, however, labs sent and reviewed as above. She has baseline mild trop elevation but no ischemia on EKG. She does have persistently elevated BNp and is hypervolemic on exam. I suspect that her edema may have contributed to her fall. Will consult internal medicine, as she was recently admitted under their service for CHF exacerbation.  IM teaching service and attending have evaluated. Plan to give one dose of IV lasix in ED, d/c with continued increased lasix dose at home and good return precautions. Pt ambulatory in ED with no difficulty. She is requesting to go home and I feel this is reasonable. Family in agreement. Return precautions given.  Final Clinical Impressions(s) / ED Diagnoses   Final diagnoses:  Acute on chronic systolic CHF (congestive heart failure) [facility])  Musculoskeletal pain    ED Discharge Orders    None       Shaune Pollack, MD 12/28/16 2226

## 2016-12-28 NOTE — ED Triage Notes (Signed)
Pt states she slipped and fell last night. She complains of left knee and hip pain. Pt does take plavix. Denies LOC or hitting her head.

## 2016-12-28 NOTE — Discharge Instructions (Signed)
Take tylenol 1000 mg every 6 hours for pain

## 2016-12-28 NOTE — Telephone Encounter (Signed)
Lm for rtc 

## 2016-12-28 NOTE — ED Notes (Signed)
Notified Dr. Erma Heritage that troponin resulted at 0.14.

## 2016-12-28 NOTE — ED Notes (Signed)
In CT/XR

## 2016-12-28 NOTE — Consult Note (Signed)
Date: 12/28/2016               Patient Name:  Jean Moore MRN: [id number]  DOB: 25-Mar-1926 Age / Sex: 83 y.o., female   PCP: Nyra Market, MD         Requesting Physician: Dr. Shaune Pollack, MD    Consulting Reason:  H/o Heart Failure, recent admission for HF exacerbation      Chief Complaint: Fall  History of Present Illness: Jean Moore is an 83 year old female with a past history of systolic heart failure (EF 15%), A. fib, hypertension, hyperlipidemia and peripheral arterial disease who presented to the ED after a fall.  Patient reports her fall was mechanical and she tripped on a carpet while walking through her living room.  She fell onto her left side with pain in her left hip and knee.  She denies LOC, syncope, lightheadedness proceeding the fall.  CT head, knee and hip x-rays are negative for fracture or acute abnormalities.  She was received recently admitted from 11/6-11/8 for heart failure exacerbation in which she presented with volume overload which improved with IV diuresis.  Her oral Lasix regimen was increased to 80 mg twice daily at discharge.  She and her family report she has been adherent to this increased regimen, denies dizziness or lightheadedness.  She states that her lower extremity edema fluctuates but consistently decreases following her Lasix dose.  She is also been trying to check her weight recently and states that it has varied but remained within 1-2 pounds.  She also reports her shortness of breath fluctuates and depends on her posture and exertion level.  In the ED, her blood pressure has been soft with systolics of 80s-110s, saturating well on room air.  BNP elevated (4200) but improved compared to prior.  Other lab work including creatinine, potassium, hemoglobin, troponin are also without significant change since discharge.  Chest x-ray did not show significant pulmonary edema.  On chart review, her soft blood pressures are also consistent with her  previous admission and she denies symptoms.  Meds: No current facility-administered medications for this encounter.    Current Outpatient Medications  Medication Sig Dispense Refill  . acetaminophen (TYLENOL) 500 MG tablet Take 500 mg by mouth every 6 (six) hours as needed for pain.    Marland Kitchen aspirin 81 MG tablet Take 1 tablet (81 mg total) by mouth daily. 120 tablet 5  . atorvastatin (LIPITOR) 40 MG tablet TAKE 1 TABLET BY MOUTH DAILY AT 6 PM. (Patient taking differently: TAKE 1 TABLET BY MOUTH DAILY) 90 tablet 1  . clopidogrel (PLAVIX) 75 MG tablet Take 1 tablet (75 mg total) by mouth daily. 30 tablet 3  . COMBIGAN 0.2-0.5 % ophthalmic solution Place 1 drop into both eyes 2 (two) times daily.  4  . digoxin (LANOXIN) 0.125 MG tablet Take 1 tablet (0.125 mg total) by mouth daily. 30 tablet 11  . dorzolamide (TRUSOPT) 2 % ophthalmic solution Place 1 drop into both eyes 2 (two) times daily.  4  . furosemide (LASIX) 40 MG tablet Take 2 tablets (80 mg total) 2 (two) times daily by mouth. 180 tablet 0  . latanoprost (XALATAN) 0.005 % ophthalmic solution Place 1 drop into both eyes at bedtime.  4  . Menthol (ICY HOT BACK) 5 % PTCH Apply 1 patch topically as needed (pain).    . metoprolol succinate (TOPROL-XL) 50 MG 24 hr tablet Take 1 tablet (50 mg total) by mouth daily. Take with or immediately following  a meal. 90 tablet 0  . Multiple Vitamins-Minerals (CENTRUM SILVER 50+WOMEN PO) Take 1 tablet daily by mouth.    Bertram Gala Glycol-Propyl Glycol (SYSTANE ULTRA OP) Place 1 drop into both eyes 4 (four) times daily as needed (dry eyes).    . potassium chloride SA (KLOR-CON M20) 20 MEQ tablet Take 1 tablet (20 mEq total) by mouth 2 (two) times daily. 90 tablet PRN    Allergies: Allergies as of 12/28/2016 - Review Complete 12/28/2016  Allergen Reaction Noted  . Shrimp [shellfish allergy] Other (See Comments) 10/12/2011   Past Medical History:  Diagnosis Date  . Atrial fibrillation (HCC)    on  coumadin since 2012, recently stopped because of vaginal bleeding  . CHF (congestive heart failure) (HCC)   . Hyperlipidemia   . Hypertension   . Routine adult health maintenance 04/09/2012   Past Surgical History:  Procedure Laterality Date  . ABDOMINAL AORTOGRAM W/LOWER EXTREMITY N/A 10/26/2016   Procedure: ABDOMINAL AORTOGRAM W/LOWER EXTREMITY;  Surgeon: Maeola Harman, MD;  Location: [facility] INVASIVE CV LAB;  Service: Cardiovascular;  Laterality: N/A;  . NO PAST SURGERIES    . PERIPHERAL VASCULAR BALLOON ANGIOPLASTY Left 11/14/2016   Procedure: PERIPHERAL VASCULAR BALLOON ANGIOPLASTY;  Surgeon: Maeola Harman, MD;  Location: [facility] INVASIVE CV LAB;  Service: Cardiovascular;  Laterality: Left;  Posterior tibial   Family History  Problem Relation Age of Onset  . Hypertension Mother   . Hypertension Father   . Thyroid disease Neg Hx    Social History   Socioeconomic History  . Marital status: Widowed    Spouse name: Not on file  . Number of children: Not on file  . Years of education: Not on file  . Highest education level: Not on file  Social Needs  . Financial resource strain: Not on file  . Food insecurity - worry: Not on file  . Food insecurity - inability: Not on file  . Transportation needs - medical: Not on file  . Transportation needs - non-medical: Not on file  Occupational History  . Not on file  Tobacco Use  . Smoking status: Never Smoker  . Smokeless tobacco: Never Used  Substance and Sexual Activity  . Alcohol use: No    Alcohol/week: 0.0 oz  . Drug use: No  . Sexual activity: Not on file  Other Topics Concern  . Not on file  Social History Narrative  . Not on file    Review of Systems: Pertinent items are noted in HPI.  Physical Exam: Blood pressure (!) 85/63, pulse 61, temperature (!) 97.4 F (36.3 C), temperature source Rectal, resp. rate (!) 24, last menstrual period 10/12/1978, SpO2 98 %. General: Pleasant elderly female resting in  bed , no acute distress HEENT: Poor dentition CV: Systolic murmur with irregular rhythm, regular rate, good peripheral pulses  Resp: Clear breath sounds bilaterally, no respiratory distress  Abd: Soft, +BS, non-tender Extr: Pitting edema to bilateral LE up to proximal tibia  Neuro: Alert and oriented x3 Skin: Warm, dry      Lab results: BMP Latest Ref Rng & Units 12/28/2016 12/20/2016 12/15/2016  Glucose 65 - 99 mg/dL 161(W) 960(A) 540(J)  BUN 6 - 20 mg/dL 81(X) 91(Y) 78(G)  Creatinine 0.44 - 1.00 mg/dL 9.56(O) 1.30 8.65(H)  BUN/Creat Ratio 12 - 28 - 30(H) -  Sodium 135 - 145 mmol/L 140 142 137  Potassium 3.5 - 5.1 mmol/L 4.4 4.7 3.8  Chloride 101 - 111 mmol/L 99(L) 97 101  CO2 22 -  32 mmol/L 33(H) 32(H) 29  Calcium 8.9 - 10.3 mg/dL 8.9 8.7 6.2(X)     Imaging results:  Ct Head Wo Contrast  Result Date: 12/28/2016 CLINICAL DATA:  Altered level of consciousness EXAM: CT HEAD WITHOUT CONTRAST TECHNIQUE: Contiguous axial images were obtained from the base of the skull through the vertex without intravenous contrast. COMPARISON:  None. FINDINGS: Brain: There is atrophy and chronic small vessel disease changes. No acute intracranial abnormality. Specifically, no hemorrhage, hydrocephalus, mass lesion, acute infarction, or significant intracranial injury. Vascular: No hyperdense vessel. Extensive calcifications in the vessels of the skullbase. Skull: No acute calvarial abnormality. Sinuses/Orbits: Visualized paranasal sinuses and mastoids clear. Orbital soft tissues unremarkable. Other: None IMPRESSION: No acute intracranial abnormality. Atrophy, chronic microvascular disease. Electronically Signed   By: Charlett Nose M.D.   On: 12/28/2016 11:21   Dg Chest Portable 1 View  Result Date: 12/28/2016 CLINICAL DATA:  Hypoxemia, low blood pressure, fell last night, history hypertension, CHF, atrial fibrillation EXAM: PORTABLE CHEST 1 VIEW COMPARISON:  Portable exam 1006 hours compared to  12/13/2016 FINDINGS: Persistent marked enlargement of cardiac silhouette. Pericardial effusion not excluded with this configuration. Atherosclerotic calcification aorta. Mediastinal contours and pulmonary vascularity normal. Lungs grossly clear with suspected underlying hyperinflation. No acute infiltrate, pleural effusion or pneumothorax. Bones demineralized. IMPRESSION: Persistent marked enlargement of cardiac silhouette, pericardial effusion not excluded. Suspected emphysematous changes without acute infiltrate. Electronically Signed   By: Ulyses Southward M.D.   On: 12/28/2016 10:15   Dg Knee Complete 4 Views Left  Result Date: 12/28/2016 CLINICAL DATA:  Fall last night, left anterior knee pain, swelling EXAM: LEFT KNEE - COMPLETE 4+ VIEW COMPARISON:  None. FINDINGS: Mild degenerative changes in the left knee, best seen in the patellofemoral compartment. No acute bony abnormality. Specifically, no fracture, subluxation, or dislocation. Soft tissues are intact. IMPRESSION: No acute bony abnormality. Electronically Signed   By: Charlett Nose M.D.   On: 12/28/2016 11:32   Dg Hip Unilat With Pelvis 2-3 Views Left  Result Date: 12/28/2016 CLINICAL DATA:  Left hip pain secondary to a fall last night. EXAM: DG HIP (WITH OR WITHOUT PELVIS) 2-3V LEFT COMPARISON:  None. FINDINGS: There is no evidence of hip fracture or dislocation. Osteopenia. Degenerative calcifications in the soft tissues adjacent to the left greater trochanter consistent with calcific tendinopathy. Aortic and iliac atherosclerosis. IMPRESSION: No acute abnormality. Aortic Atherosclerosis (ICD10-I70.0). Electronically Signed   By: Francene Boyers M.D.   On: 12/28/2016 10:14    Other results: EKG: Rate controlled atrial fibrillation, similar to prior   Assessment, Plan, & Recommendations by Problem:  HFrEF (EF 15%) Jean Moore has advanced heart failure with NYHA class III symptoms and has declined ICD placement and other advanced  interventions in the past.  Since her last admission she appears to have remained fairly stable on her increased diuretic regimen.  Though she does have lower extremity edema today, her lab values, reported symptoms, and appearance of chest x-ray are similar to her baseline when discharged.  At this point, would not increase her home diuretic regimen as, though similar to baseline, she did have a small increase in creatinine (0.9 versus 1.1) and she has soft blood pressure at baseline.  She is eager to go home for Thanksgiving and given her stability, outpatient diuretic management is appropriate.  --Agree with one time IV Lasix dose --Discharge on home regimen: PO Lasix 80 mg BID --Provided counseling on fluid/salt restriction, especially important over Thanksgiving holiday  --Close f/u with [facility]  and Cardiology as below   Thank you for allowing us to participate in Jean Moore's care.   Dispo: From IM standpoint, pt is stable for discharge with resumption of home diuretic regimen. She has upcoming follow up with PCP and Cards, and will put in request to have closer f/u in [facility] for 11/27 or 11/28 pending availability.   The patient does have a current PCP (Samuella CotaSvalina, Candida PeelingGorica, MD) and will request an [facility] appointment prior to her scheduled PCP appointment.   Signed: Ginger CarneHarden, Josilynn Losh, MD 12/28/2016, 1:51 PM

## 2016-12-28 NOTE — ED Notes (Addendum)
Vitals at 1345 were validated incorrectly. Pulse ox was not on patient at the time so pulse and oxygen level were entered incorrectly.  Vitals at this time were 85/67 (EDP aware of low BP. Patient runs low. ) HR 71, RR 20, Oxygen 97% RA.

## 2017-01-03 ENCOUNTER — Other Ambulatory Visit: Payer: Self-pay | Admitting: Internal Medicine

## 2017-01-03 DIAGNOSIS — I739 Peripheral vascular disease, unspecified: Secondary | ICD-10-CM

## 2017-01-03 DIAGNOSIS — L97519 Non-pressure chronic ulcer of other part of right foot with unspecified severity: Secondary | ICD-10-CM

## 2017-01-03 DIAGNOSIS — I5022 Chronic systolic (congestive) heart failure: Secondary | ICD-10-CM

## 2017-01-03 DIAGNOSIS — L97529 Non-pressure chronic ulcer of other part of left foot with unspecified severity: Secondary | ICD-10-CM

## 2017-01-04 ENCOUNTER — Telehealth: Payer: Self-pay

## 2017-01-04 NOTE — Telephone Encounter (Signed)
Agree 

## 2017-01-04 NOTE — Telephone Encounter (Signed)
I agree

## 2017-01-04 NOTE — Telephone Encounter (Signed)
Called today and got michelle, she did get [facility] messages. Ask if pt needs anything new that we could discuss She would like to dress the leg wound with aquacell and then 2 layer compression drsgs Gave VO okay, do you agree? The order will come in dr's narendra and svalina name

## 2017-01-04 NOTE — Telephone Encounter (Signed)
Jean Moore with [facility] requesting VO for home health aid and skill nursing. Please call back.

## 2017-01-06 ENCOUNTER — Ambulatory Visit (HOSPITAL_COMMUNITY)
Admission: RE | Admit: 2017-01-06 | Discharge: 2017-01-06 | Disposition: A | Discharge status: Home / Self Care | Payer: Medicare Other | Source: Ambulatory Visit | Attending: Vascular Surgery | Admitting: Vascular Surgery

## 2017-01-06 DIAGNOSIS — E785 Hyperlipidemia, unspecified: Secondary | ICD-10-CM | POA: Diagnosis not present

## 2017-01-06 DIAGNOSIS — R9389 Abnormal findings on diagnostic imaging of other specified body structures: Secondary | ICD-10-CM | POA: Diagnosis not present

## 2017-01-06 DIAGNOSIS — R0989 Other specified symptoms and signs involving the circulatory and respiratory systems: Secondary | ICD-10-CM | POA: Insufficient documentation

## 2017-01-06 DIAGNOSIS — I739 Peripheral vascular disease, unspecified: Secondary | ICD-10-CM | POA: Diagnosis not present

## 2017-01-06 DIAGNOSIS — I10 Essential (primary) hypertension: Secondary | ICD-10-CM | POA: Diagnosis not present

## 2017-01-06 NOTE — Telephone Encounter (Signed)
I agree

## 2017-01-06 NOTE — Telephone Encounter (Signed)
Spoke w/ Enrique Sack, VO given for HHN 2x week for 6 weeks for disease management and medication assistance and teaching. Do you agree?

## 2017-01-11 ENCOUNTER — Encounter: Payer: Self-pay | Admitting: Internal Medicine

## 2017-01-11 ENCOUNTER — Ambulatory Visit (INDEPENDENT_AMBULATORY_CARE_PROVIDER_SITE_OTHER): Payer: Medicare Other | Admitting: Internal Medicine

## 2017-01-11 ENCOUNTER — Other Ambulatory Visit: Payer: Self-pay

## 2017-01-11 VITALS — BP 125/82 | HR 63 | Temp 97.6°F | Ht 63.0 in | Wt 137.9 lb

## 2017-01-11 DIAGNOSIS — M79605 Pain in left leg: Secondary | ICD-10-CM

## 2017-01-11 DIAGNOSIS — M25552 Pain in left hip: Secondary | ICD-10-CM

## 2017-01-11 DIAGNOSIS — I110 Hypertensive heart disease with heart failure: Secondary | ICD-10-CM

## 2017-01-11 DIAGNOSIS — B370 Candidal stomatitis: Secondary | ICD-10-CM | POA: Diagnosis not present

## 2017-01-11 DIAGNOSIS — E785 Hyperlipidemia, unspecified: Secondary | ICD-10-CM | POA: Diagnosis not present

## 2017-01-11 DIAGNOSIS — R011 Cardiac murmur, unspecified: Secondary | ICD-10-CM | POA: Diagnosis not present

## 2017-01-11 DIAGNOSIS — Z9181 History of falling: Secondary | ICD-10-CM | POA: Diagnosis not present

## 2017-01-11 DIAGNOSIS — Z79899 Other long term (current) drug therapy: Secondary | ICD-10-CM | POA: Diagnosis not present

## 2017-01-11 DIAGNOSIS — M25562 Pain in left knee: Secondary | ICD-10-CM | POA: Diagnosis not present

## 2017-01-11 DIAGNOSIS — I480 Paroxysmal atrial fibrillation: Secondary | ICD-10-CM

## 2017-01-11 DIAGNOSIS — I10 Essential (primary) hypertension: Secondary | ICD-10-CM

## 2017-01-11 DIAGNOSIS — R196 Halitosis: Secondary | ICD-10-CM

## 2017-01-11 DIAGNOSIS — I5022 Chronic systolic (congestive) heart failure: Secondary | ICD-10-CM | POA: Diagnosis not present

## 2017-01-11 MED ORDER — NYSTATIN 100000 UNIT/ML MT SUSP: 5.0000 mL | Freq: Four times a day (QID) | OROMUCOSAL | 5 refills | Status: DC

## 2017-01-11 MED ORDER — DICLOFENAC SODIUM 1 % TD GEL: 2.0000 g | Freq: Four times a day (QID) | TRANSDERMAL | 2 refills | Status: DC

## 2017-01-11 MED FILL — DICLOFENAC SODIUM 1% GEL: 1 | 13 days supply | Qty: 100 | Fill #0

## 2017-01-11 MED FILL — NYSTATIN 100,000 UNITS/ML S: 100000 | 3 days supply | Qty: 60 | Fill #0

## 2017-01-11 NOTE — Progress Notes (Signed)
   CC: swelling  HPI:  [name] is a 83 y.o. with a PMH of paroxysmal atrial fibrillation not on anticoagulation, systolic heart failure (EF 15%, no ICD), hypertension and hyperlipidemia presenting to clinic for follow-up on ED visit for fall and swelling.  Patient visited ED on 11/21 following a fall after tripping on carpet; she hit her left hip and head and had no loss of consciousness. CT head wo contrast was negative for hemorrhage; Xrays of pelvis, hips and L knee were negative for fractures. Patient was found to be volume overloaded; she was evaluated by the IMTS inpatient team - she was given IV lasix with improvement in shortness of breath and was asked to follow up with me. Patient today states that she is still having intermittent left hip and knee pain. She denies chest pain, shortness of breath, orthopnea, palpitations, headache, vision or hearing changes; she is compliant with medications at home.  Patient endorses dry mouth; she denies dysphagia.   Please see problem based Assessment and Plan for status of patients chronic conditions.  Past Medical History:  Diagnosis Date  . Atrial fibrillation (HCC)    on coumadin since 2012, recently stopped because of vaginal bleeding  . CHF (congestive heart failure) (HCC)   . Hyperlipidemia   . Hypertension   . Routine adult health maintenance 04/09/2012    Review of Systems:   ROS Per HPI  Physical Exam:  Vitals:   01/11/17 1457  BP: 125/82  Pulse: 63  Temp: 97.6 F (36.4 C)  TempSrc: Oral  SpO2: 97%  Weight: 137 lb 14.4 oz (62.6 kg)  Height: 5\' 3"  (1.6 m)   GENERAL- alert, co-operative, appears as stated age, not in any distress. HEENT- Atraumatic, normocephalic, EOMI, oral mucosa moist; oral thrush/malodorous CARDIAC- RRR, systolic murmur, no rubs or gallops. RESP- Moving equal volumes of air, and clear to auscultation bilaterally, no wheezes or crackles. ABDOMEN- Soft, nontender, bowel sounds present. NEURO-  No obvious Cr N abnormality. EXTREMITIES- pulse 2+ radial, symmetric, nonpalpable; LE 4/5 strength bilaterally; small knee effusions bilaterally; no bony tenderness in knees or left hip. PT/DP pulses; 2+ pitting edema to upper tibia bilaterally SKIN- Warm, dry, no rash or lesion. PSYCH- Normal mood and affect, appropriate thought content and speech.  Assessment & Plan:   See Encounters Tab for problem based charting.   Patient discussed with Dr. Cecilie Kicks, MD Internal Medicine PGY2

## 2017-01-11 NOTE — Patient Instructions (Signed)
For your dry mouth, swish nystatin solution four times daily.  For your leg pain, apply voltaren gel 2-3 times a day as needed to your leg.   Continue the rest of your medicines as is. If you have any increased shortness of breath or chest tightness, please come to the clinic to be evaluated.

## 2017-01-13 ENCOUNTER — Ambulatory Visit (INDEPENDENT_AMBULATORY_CARE_PROVIDER_SITE_OTHER): Payer: Medicare Other | Admitting: Vascular Surgery

## 2017-01-13 ENCOUNTER — Encounter: Payer: Self-pay | Admitting: Vascular Surgery

## 2017-01-13 VITALS — BP 114/73 | HR 66 | Temp 97.5°F | Resp 20 | Ht 63.0 in | Wt 136.0 lb

## 2017-01-13 DIAGNOSIS — I998 Other disorder of circulatory system: Secondary | ICD-10-CM

## 2017-01-13 DIAGNOSIS — I70229 Atherosclerosis of native arteries of extremities with rest pain, unspecified extremity: Secondary | ICD-10-CM

## 2017-01-13 NOTE — Progress Notes (Signed)
Patient ID: SELAMAWIT EBEL, female   DOB: 1926/03/07, 83 y.o.   MRN: [id number]  Reason for Consult: PAD (3-4 wk f/u ABI. )   Referred by Nyra Market, MD  Subjective:     HPI:  HALLE HOPPS is a 83 y.o. female with previous discoloration of left toes as well as the ulceration of the left foot on the medial aspect.  She has gone retrograde tibial revascularization.  She denies improvement in the toes as well as the pain.  The ulceration is slowly healing.  She does complain of swelling in her bilateral feet.  Pain now is worse on the right and much improved on the left.  Past Medical History:  Diagnosis Date  . Atrial fibrillation (HCC)    on coumadin since 2012, recently stopped because of vaginal bleeding  . CHF (congestive heart failure) (HCC)   . Hyperlipidemia   . Hypertension   . Routine adult health maintenance 04/09/2012   Family History  Problem Relation Age of Onset  . Hypertension Mother   . Hypertension Father   . Thyroid disease Neg Hx    Past Surgical History:  Procedure Laterality Date  . ABDOMINAL AORTOGRAM W/LOWER EXTREMITY N/A 10/26/2016   Procedure: ABDOMINAL AORTOGRAM W/LOWER EXTREMITY;  Surgeon: Maeola Harman, MD;  Location: [facility] INVASIVE CV LAB;  Service: Cardiovascular;  Laterality: N/A;  . NO PAST SURGERIES    . PERIPHERAL VASCULAR BALLOON ANGIOPLASTY Left 11/14/2016   Procedure: PERIPHERAL VASCULAR BALLOON ANGIOPLASTY;  Surgeon: Maeola Harman, MD;  Location: [facility] INVASIVE CV LAB;  Service: Cardiovascular;  Laterality: Left;  Posterior tibial    Short Social History:  Social History   Tobacco Use  . Smoking status: Never Smoker  . Smokeless tobacco: Never Used  Substance Use Topics  . Alcohol use: No    Alcohol/week: 0.0 oz    Allergies  Allergen Reactions  . Shrimp [Shellfish Allergy] Other (See Comments)    Reaction: Hives, Itching, Bumps, Swelling    Current Outpatient Medications  Medication Sig Dispense Refill    . acetaminophen (TYLENOL) 500 MG tablet Take 500 mg by mouth every 6 (six) hours as needed for pain.    Marland Kitchen aspirin 81 MG tablet Take 1 tablet (81 mg total) by mouth daily. 120 tablet 5  . atorvastatin (LIPITOR) 40 MG tablet TAKE 1 TABLET BY MOUTH DAILY AT 6 PM. (Patient taking differently: TAKE 1 TABLET BY MOUTH DAILY) 90 tablet 1  . clopidogrel (PLAVIX) 75 MG tablet Take 1 tablet (75 mg total) by mouth daily. 30 tablet 3  . COMBIGAN 0.2-0.5 % ophthalmic solution Place 1 drop into both eyes 2 (two) times daily.  4  . diclofenac sodium (VOLTAREN) 1 % GEL Apply 2 g topically 4 (four) times daily. 100 g 2  . digoxin (LANOXIN) 0.125 MG tablet Take 1 tablet (0.125 mg total) by mouth daily. 30 tablet 11  . dorzolamide (TRUSOPT) 2 % ophthalmic solution Place 1 drop into both eyes 2 (two) times daily.  4  . furosemide (LASIX) 40 MG tablet Take 2 tablets (80 mg total) 2 (two) times daily by mouth. 180 tablet 0  . latanoprost (XALATAN) 0.005 % ophthalmic solution Place 1 drop into both eyes at bedtime.  4  . Menthol (ICY HOT BACK) 5 % PTCH Apply 1 patch topically as needed (pain).    . metoprolol succinate (TOPROL-XL) 50 MG 24 hr tablet Take 1 tablet (50 mg total) by mouth daily. Take with or immediately following  a meal. 90 tablet 0  . Multiple Vitamins-Minerals (CENTRUM SILVER 50+WOMEN PO) Take 1 tablet daily by mouth.    . nystatin (MYCOSTATIN) 100000 UNIT/ML suspension Take 5 mLs (500,000 Units total) by mouth 4 (four) times daily. 60 mL 5  . Polyethyl Glycol-Propyl Glycol (SYSTANE ULTRA OP) Place 1 drop into both eyes 4 (four) times daily as needed (dry eyes).    . potassium chloride SA (KLOR-CON M20) 20 MEQ tablet Take 1 tablet (20 mEq total) by mouth 2 (two) times daily. 90 tablet PRN   No current facility-administered medications for this visit.     Review of Systems  Constitutional:  Constitutional negative. HENT: HENT negative.  Eyes: Eyes negative.  Cardiovascular: Positive for leg  swelling.  GI: Gastrointestinal negative.  Musculoskeletal: Positive for leg pain and joint pain.  Skin: Positive for wound.  Neurological: Neurological negative. Hematologic: Hematologic/lymphatic negative.  Psychiatric: Psychiatric negative.        Objective:  Objective   Vitals:   01/13/17 1403  BP: 114/73  Pulse: 66  Resp: 20  Temp: (!) 97.5 F (36.4 C)  TempSrc: Oral  SpO2: 99%  Weight: 136 lb (61.7 kg)  Height: 5\' 3"  (1.6 m)   Body mass index is 24.09 kg/m.  Physical Exam  Constitutional: She appears well-developed.  HENT:  Head: Normocephalic.  Cardiovascular: Normal rate.  Strong left pt signal  Musculoskeletal: She exhibits edema.  Much improved left foot coloration Wound on medial bunion is pinpoint, no erythema or drainage  Neurological: She is alert.  Skin: Skin is warm and dry.  Psychiatric: She has a normal mood and affect. Her behavior is normal.         Assessment/Plan:     83 year old female status post left lower extremity PT revascularization.  She has a good signal in her foot is looking much better.  We will follow her up in 6 months with repeat ABIs.  Family demonstrates good understanding.  If there are issues between now and then we can certainly see her sooner.     Maeola HarmanBrandon Christopher Leviathan Macera MD [facility]

## 2017-01-14 ENCOUNTER — Encounter: Payer: Self-pay | Admitting: Internal Medicine

## 2017-01-14 DIAGNOSIS — M79605 Pain in left leg: Secondary | ICD-10-CM | POA: Insufficient documentation

## 2017-01-14 NOTE — Assessment & Plan Note (Addendum)
Stable LE edema, otherwise she denies orthopnea, shortness of breath, chest tightness.   Plan: --continue metoprolol 50mg  daily, digoxin 0.125mg  daily, and furosemide 80mg  BID

## 2017-01-14 NOTE — Assessment & Plan Note (Signed)
Controlled.  Plan: --continue metoprolol 50mg  daily

## 2017-01-14 NOTE — Assessment & Plan Note (Signed)
Patient with intermittent left lateral leg pain and knee pain following a fall 2 wks ago; overall getting better. Exam does not reveal tenderness to palpation; she does have small knee effusions that are equal bilaterally with no bony tenderness.  Plan: --voltaren gel PRN

## 2017-01-14 NOTE — Assessment & Plan Note (Signed)
RR today. Stable.  Plan: --continue metoprolol 50mg  daily

## 2017-01-14 NOTE — Assessment & Plan Note (Signed)
Patient with complaints of dry mouth; exam reveals halitosis and oral thrush.  Plan: --nystatin solution

## 2017-01-16 NOTE — Progress Notes (Signed)
Case discussed with Dr. Svalina at the time of the visit. We reviewed the resident's history and exam and pertinent patient test results. I agree with the assessment, diagnosis, and plan of care documented in the resident's note. 

## 2017-01-23 ENCOUNTER — Telehealth: Payer: Self-pay | Admitting: Internal Medicine

## 2017-01-23 NOTE — Telephone Encounter (Signed)
Rec'd call from [facility] Nurse Enrique Sack from [facility] requesting VO  Extension on pt's PT.  Please call [phone].

## 2017-01-24 ENCOUNTER — Telehealth: Payer: Self-pay | Admitting: Internal Medicine

## 2017-01-24 NOTE — Telephone Encounter (Signed)
Per Renard Hamper at [facility] patient declined service today

## 2017-01-27 NOTE — Telephone Encounter (Signed)
Lm for rtc 

## 2017-02-01 ENCOUNTER — Other Ambulatory Visit: Payer: Self-pay | Admitting: Internal Medicine

## 2017-02-01 MED FILL — DIGOXIN 0.125 MG TABLET: 125 | 90 days supply | Qty: 90 | Fill #3

## 2017-02-01 MED FILL — FUROSEMIDE 40 MG TAB: 40 | 45 days supply | Qty: 180 | Fill #4

## 2017-02-02 MED FILL — POTASSIUM CL ER 20 MEQ TABL: 20 | 30 days supply | Qty: 60 | Fill #0

## 2017-02-02 NOTE — Telephone Encounter (Signed)
Next appt scheduled  04/12/17 with PCP. 

## 2017-02-09 ENCOUNTER — Telehealth: Payer: Self-pay

## 2017-02-09 NOTE — Telephone Encounter (Signed)
Faxed [facility] form @ [phone] on 02/10/2016.

## 2017-02-14 ENCOUNTER — Telehealth: Payer: Self-pay | Admitting: Internal Medicine

## 2017-02-14 NOTE — Telephone Encounter (Signed)
Jean Moore [name]) reported after visit with patient today, she noted crackles to L lower lobe, increased swelling to both legs. Patient also mentioned increased shortness of breath with activities. Weight stable but not checking it regularly.  Spoke to patient's son & will be bringing patient in [facility] tomorrow 02/15/17 @ 3:15p.

## 2017-02-14 NOTE — Telephone Encounter (Signed)
I agree she should be seen. Thank you!  Candida Peeling

## 2017-02-14 NOTE — Telephone Encounter (Signed)
[facility] Nurse  Jean Moore) called and would like a call back as soon as possible.

## 2017-02-15 ENCOUNTER — Ambulatory Visit (INDEPENDENT_AMBULATORY_CARE_PROVIDER_SITE_OTHER): Payer: Medicare Other | Admitting: Internal Medicine

## 2017-02-15 ENCOUNTER — Other Ambulatory Visit: Payer: Self-pay

## 2017-02-15 ENCOUNTER — Ambulatory Visit (HOSPITAL_COMMUNITY)
Admission: RE | Admit: 2017-02-15 | Discharge: 2017-02-15 | Disposition: A | Discharge status: Home / Self Care | Payer: Medicare Other | Source: Ambulatory Visit | Attending: Student in an Organized Health Care Education/Training Program | Admitting: Student in an Organized Health Care Education/Training Program

## 2017-02-15 VITALS — BP 104/66 | HR 69 | Temp 97.5°F | Ht 63.0 in | Wt 145.6 lb

## 2017-02-15 DIAGNOSIS — I739 Peripheral vascular disease, unspecified: Secondary | ICD-10-CM

## 2017-02-15 DIAGNOSIS — I5022 Chronic systolic (congestive) heart failure: Secondary | ICD-10-CM

## 2017-02-15 DIAGNOSIS — Z79899 Other long term (current) drug therapy: Secondary | ICD-10-CM

## 2017-02-15 DIAGNOSIS — K0889 Other specified disorders of teeth and supporting structures: Secondary | ICD-10-CM | POA: Diagnosis not present

## 2017-02-15 DIAGNOSIS — L97529 Non-pressure chronic ulcer of other part of left foot with unspecified severity: Secondary | ICD-10-CM | POA: Diagnosis present

## 2017-02-15 DIAGNOSIS — I110 Hypertensive heart disease with heart failure: Secondary | ICD-10-CM | POA: Diagnosis not present

## 2017-02-15 DIAGNOSIS — L97519 Non-pressure chronic ulcer of other part of right foot with unspecified severity: Secondary | ICD-10-CM | POA: Diagnosis present

## 2017-02-15 DIAGNOSIS — M7989 Other specified soft tissue disorders: Secondary | ICD-10-CM | POA: Diagnosis not present

## 2017-02-15 DIAGNOSIS — M21612 Bunion of left foot: Secondary | ICD-10-CM | POA: Insufficient documentation

## 2017-02-15 DIAGNOSIS — I480 Paroxysmal atrial fibrillation: Secondary | ICD-10-CM

## 2017-02-15 MED ORDER — DOXYCYCLINE HYCLATE 100 MG PO TABS: 100.0000 mg | ORAL_TABLET | Freq: Two times a day (BID) | ORAL | 0 refills | Status: AC

## 2017-02-15 NOTE — Patient Instructions (Addendum)
FOLLOW-UP INSTRUCTIONS When: 1 week  For: Follow up fluid and swelling and foot wound What to bring: Medications, daily weight measurements    Nice to see you again Jean Moore   To help with the fluid in your legs, increase your Lasix dose to 120 mg (3 pills) twice a day instead of 80 mg, for 1 week and then we will see you back in the clinic to see how you are doing.   For the wound on your L foot- there is some drainage there so we are going to treat with a course of antibiotics to clear any potential infection which we sent to the [facility] on [address]. We are going to send for an X-ray of the left foot to make sure any infection does not involve the bone.  Hopefully improving the swelling will also help healing.  And we would like to have a nurse come out to your home and make sure it has the appropriate dressings to increase the chance of good healing.  It may also be beneficial to give Dr. Darcella Cheshire office a call to make a follow-up appointment for sooner to have them take a look as well.  For your right foot, this is superficial and does not look infected but is something the wound care nurse can help with as well.

## 2017-02-15 NOTE — Progress Notes (Signed)
   CC: Foot wounds, edema   HPI:  Jean Moore is a 84 y.o. F with a past medical history of HFrEF (EF 15%, no ICD), PAF (no anti-coagulation), PAD, HTN who presents to the clinic with complaints of increased edema and foot wounds.   HFrEF: A hospice nurse noted increased lower extremity edema and crackles to the left lung base recent home visit and recommended evaluation.  She is currently on Lasix 80 mg twice daily and has not missed any doses.  She states her lower extremity edema fluctuates and will improve with keeping them elevated or after Lasix.  She denies increasing symptoms of orthopnea, no PND.  Notes intermittent dyspnea not significantly changed from baseline.  She started being more adherent to fluid restriction this week and also is given low-sodium diet by her son who is her caretaker.  LE wounds: She has a chronic wound overlying a bunion deformity on her left foot which family states has recently worsened with drainage.  She also has a new lesion on her right great toe.  About 3 days ago, the patient noted a piece of loose skin which she removed which resulted in bleeding and the appearance of this wound.  Her son wrapped both wounds and called the hospice nurse who also recommended evaluation for this issue.  She is established with vascular surgery for peripheral arterial disease has a history of intervention to her left lower extremity vasculature.  Past Medical History:  Diagnosis Date  . Aortic stenosis   . Atrial fibrillation (HCC)    on coumadin since 2012, recently stopped because of vaginal bleeding  . CHF (congestive heart failure) (HCC)   . Hyperlipidemia   . Hypertension   . Routine adult health maintenance 04/09/2012   Review of Systems:  Review of Systems  Respiratory: Positive for shortness of breath (intermittent).   Cardiovascular: Positive for leg swelling. Negative for PND.  Skin:       Bilateral foot wounds     Physical Exam:  Vitals:   02/15/17 1539  BP: 104/66  Pulse: 69  Temp: (!) 97.5 F (36.4 C)  TempSrc: Oral  SpO2: 99%  Weight: 145 lb 9.6 oz (66 kg)  Height: 5\' 3"  (1.6 m)   General: Elderly female sitting in chair comfortably, no acute distress HEENT: Poor dentition, no JVD appreciated  CV: RRR  Resp: Crackles to L base, otherwise clear, normal work of breathing, no distress  Abd: Soft, non-tender Extr: Pitting edema up to knee bilaterally, non-painful wound overlying L bunion with central area of increased depth and purulent drainage. Bone not palpable with probing. Distal foot/toes cooler compared to proximal Neuro: Alert Skin: Warm, dry. Flaking and fragile skin to bilateral feet, superficial erosion to plantar R great toe with no purulence or evidence of infection       Assessment & Plan:   See Encounters Tab for problem based charting.  Patient seen with Dr. Heide Spark

## 2017-02-16 ENCOUNTER — Telehealth: Payer: Self-pay | Admitting: Internal Medicine

## 2017-02-16 NOTE — Progress Notes (Signed)
Internal Medicine Clinic Attending  I saw and evaluated the patient.  I personally confirmed the key portions of the history and exam documented by Dr. Harden and I reviewed pertinent patient test results.  The assessment, diagnosis, and plan were formulated together and I agree with the documentation in the resident's note.  

## 2017-02-16 NOTE — Assessment & Plan Note (Addendum)
Patient has pitting edema up to her knee similar to evaluation in the ED in November.  She states her lower extremity edema fluctuates, but she is not describing increased orthopnea or dyspnea symptoms, crackles isolated to the left base.  At this point, would favor increasing her oral Lasix regimen from 80 mg twice daily to 120 mg twice daily for 1 week and following up for reassessment and lab. Hopefully, decreasing her edema will aid in wound healing as well.  She was encouraged to continue adhering to fluid and salt restriction.  --Lasix 120 mg BID x 7 days --F/u in 1 wk for reassessment of fluid status, BP, and BMP  --Cont metop, digoxin without changes

## 2017-02-16 NOTE — Telephone Encounter (Signed)
Rec'd Call from [facility] @ [facility] requesting a phone call be made to Pt's Son for X-RAY Results when ready.  Please contact Nurse @ [facility] if any questions.

## 2017-02-16 NOTE — Assessment & Plan Note (Addendum)
She has had a worsening of her chronic wound on her left great toe with purulent drainage and increased size.  Worse per family report, as well as description based on vascular surgery appointment in December.  No exposed bone on probing the wound, but will obtain left foot x-ray to rule out osteomyelitis underlying wound.  Her right great toe wound is superficial without signs of infection and will hopefully be amenable to healing.  A shorter interval follow-up with vascular surgery may be beneficial as well.  We treat for soft tissue infection and will put in referral for wound care to assess and make recommendations to increase chance of healing.  --Doxycycline 100 mg BID x 5 days  --[facility] Wound care referral  --Wounds re-dressed by nursing in clinic   **L foot XRs without bony involvement

## 2017-02-16 NOTE — Telephone Encounter (Signed)
Called patient's son Alger Simons (primary caretaker) to let him know XR does not show bone involvement from her foot ulcer. He was appreciative of call and had no further questions.  Nyra Market, MD IMTS - PGY2

## 2017-02-16 NOTE — Telephone Encounter (Signed)
Phone for the son is [phone].

## 2017-02-22 NOTE — Telephone Encounter (Signed)
Thanks

## 2017-02-23 MED FILL — ATORVASTATIN 40 MG TABLET: 40 | 90 days supply | Qty: 90 | Fill #1

## 2017-02-24 ENCOUNTER — Ambulatory Visit (INDEPENDENT_AMBULATORY_CARE_PROVIDER_SITE_OTHER): Payer: Medicare Other | Admitting: Internal Medicine

## 2017-02-24 ENCOUNTER — Other Ambulatory Visit: Payer: Self-pay

## 2017-02-24 VITALS — BP 106/68 | HR 75 | Temp 98.2°F | Ht 63.0 in | Wt 141.1 lb

## 2017-02-24 DIAGNOSIS — I5022 Chronic systolic (congestive) heart failure: Secondary | ICD-10-CM

## 2017-02-24 DIAGNOSIS — I110 Hypertensive heart disease with heart failure: Secondary | ICD-10-CM | POA: Diagnosis not present

## 2017-02-24 DIAGNOSIS — I480 Paroxysmal atrial fibrillation: Secondary | ICD-10-CM

## 2017-02-24 DIAGNOSIS — L97519 Non-pressure chronic ulcer of other part of right foot with unspecified severity: Secondary | ICD-10-CM | POA: Diagnosis not present

## 2017-02-24 DIAGNOSIS — Z79899 Other long term (current) drug therapy: Secondary | ICD-10-CM

## 2017-02-24 DIAGNOSIS — L97529 Non-pressure chronic ulcer of other part of left foot with unspecified severity: Secondary | ICD-10-CM

## 2017-02-24 DIAGNOSIS — I739 Peripheral vascular disease, unspecified: Secondary | ICD-10-CM

## 2017-02-24 MED FILL — LATANOPROST 0.005% EYE DRP: 0.005 | 25 days supply | Qty: 3 | Fill #7

## 2017-02-24 NOTE — Assessment & Plan Note (Signed)
Assessment At her last visit on 1/10, her oral Lasix regimen was increased to 120 mg twice a day for one week. Since then, her weight has decreased 4 pounds. She continues to have pitting edema in bilateral lower extremity to her knees. She denies increased orthopnea or dyspnea symptoms. She denies chest pain. She is on digoxin 0.125 mg daily and metoprolol 50 mg once a day. Her digoxin level in 2017 was undetectable.  Plan - Check BMP - Lasix 80 mg twice a day - Continue metoprolol and digoxin - Check digoxin level - Follow up in 2 weeks

## 2017-02-24 NOTE — Patient Instructions (Addendum)
FOLLOW-UP INSTRUCTIONS When: 2 weeks For: feet wound follow up What to bring: medications   Jean Moore,  It was a pleasure to meet you today.  We are checking some blood work to make sure your kidney function looks okay. I will call you if the results are abnormal.  For the ulcers on your feet, please continue to follow with wound care. Please also call your vascular surgeon's office to schedule an appointment to see you for the lesions on your feet.  Please return to our clinic in 2 weeks.

## 2017-02-24 NOTE — Assessment & Plan Note (Addendum)
Assessment Left foot wound appears to be healing on my exam today, however I cannot be certain because I am not sure what it looked like at previous visits. Left foot x-ray negative for osteomyelitis. Right great toe wound is superficial and without signs of infection. She was treated for soft tissue infection with doxycycline 5 days at her last visit on 1/10. Wound care referral was also placed.  Given her history of requiring intervention for PAD, I advised patient to contact her vascular surgeon's office to be seen for these wounds. She is agreeable to this plan.  Plan - Continue home health wound care - Contact vascular surgeon's office to schedule an appointment - Follow up in 2 weeks for reassessment of wounds

## 2017-02-24 NOTE — Progress Notes (Signed)
   CC: foot wounds  HPI:  [name] is a 84 y.o. female with PMH significant for HFrEF (EF 15%, no ICD), PAF (no anti-coagulation), PAD, and HTN who presents for follow-up of foot wounds.  She was last seen in clinic on 1/9 to address the lower extremity wounds. She has a worsening of her chronic wound on her left great toe with purulent drainage and increased size. Left foot x-ray was negative for osteomyelitis. She also had a new superficial right great toe wound without signs of infection. She was treated for soft tissue infection with doxycycline for 5 days and wound care referral was placed.  Past Medical History:  Diagnosis Date  . Aortic stenosis   . Atrial fibrillation (HCC)    on coumadin since 2012, recently stopped because of vaginal bleeding  . CHF (congestive heart failure) (HCC)   . Hyperlipidemia   . Hypertension   . Routine adult health maintenance 04/09/2012   Review of Systems:   GEN: Negative for fevers or chills MSK: Positive for superficial open wound on bottom of right great toe. No drainage. Positive for wound on bunion of left great toe. No drainage. No erythema, warmth, or swelling. Cold feet, nonpalpable DP pulses. CV: Positive for leg swelling. PULM: No shortness of breath or cough.  Physical Exam:  Vitals:   02/24/17 1423  BP: 106/68  Pulse: 75  Temp: 98.2 F (36.8 C)  Weight: 141 lb 1.6 oz (64 kg)  Height: 5\' 3"  (1.6 m)   GEN: Sitting in wheelchair in NAD. Alert and oriented. CV: NR & RR; pitting edema up to knee bilaterally. PULM: CTAB MSK: Superficial wound of right great toe without erythema or drainage. Non-painful wound overlying L bunion  Right great toe   Left great toe    Assessment & Plan:   See Encounters Tab for problem based charting.  Patient discussed with Dr. Darcus Pester, MD Internal Medicine, PGY-1

## 2017-02-25 LAB — BMP8+ANION GAP
ANION GAP: 14 mmol/L (ref 10.0–18.0)
BUN/Creatinine Ratio: 26 (ref 12–28)
BUN: 29 mg/dL — ABNORMAL HIGH (ref 8–27)
CHLORIDE: 100 mmol/L (ref 96–106)
CO2: 28 mmol/L (ref 20–29)
Calcium: 9 mg/dL (ref 8.7–10.3)
Creatinine, Ser: 1.12 mg/dL — ABNORMAL HIGH (ref 0.57–1.00)
GFR calc Af Amer: 51 mL/min/{1.73_m2} — ABNORMAL LOW (ref >59)
GFR calc non Af Amer: 44 mL/min/{1.73_m2} — ABNORMAL LOW (ref >59)
GLUCOSE: 272 mg/dL — AB (ref 65–99)
POTASSIUM: 4.6 mmol/L (ref 3.5–5.2)
Sodium: 142 mmol/L (ref 134–144)

## 2017-02-25 LAB — DIGOXIN LEVEL: DIGOXIN, SERUM: 2.2 ng/mL — AB (ref 0.5–0.9)

## 2017-02-28 ENCOUNTER — Telehealth: Payer: Self-pay | Admitting: Internal Medicine

## 2017-02-28 NOTE — Progress Notes (Signed)
Internal Medicine Clinic Attending  Case discussed with Dr. Renaldo Reel at the time of the visit.  We reviewed the resident's history and exam and pertinent patient test results.  I agree with the assessment, diagnosis, and plan of care documented in the resident's note. We discussed repeating digoxin level given age+ worsen renal function- it also does not appears she has even been in a steady theraputic range.  This returned in the toxic level, discussed with Dr Renaldo Reel, will have her hold for 1 week, she will resume back at 0.125 every other day, will need repeat digoxin level at next visit.

## 2017-02-28 NOTE — Telephone Encounter (Signed)
I agree, thank you!  Jean Moore 

## 2017-02-28 NOTE — Telephone Encounter (Signed)
[facility]  HH VO for twice a week for 2 weeks and once a week for 2 weeks.

## 2017-02-28 NOTE — Telephone Encounter (Signed)
VO given to [place], do you agree? Thanks!

## 2017-03-02 ENCOUNTER — Encounter: Payer: Self-pay | Admitting: Family

## 2017-03-02 ENCOUNTER — Ambulatory Visit (INDEPENDENT_AMBULATORY_CARE_PROVIDER_SITE_OTHER): Payer: Medicare Other | Admitting: Family

## 2017-03-02 VITALS — BP 92/65 | HR 62 | Temp 97.0°F | Resp 16 | Ht 63.0 in | Wt 137.0 lb

## 2017-03-02 DIAGNOSIS — L97519 Non-pressure chronic ulcer of other part of right foot with unspecified severity: Secondary | ICD-10-CM | POA: Diagnosis not present

## 2017-03-02 DIAGNOSIS — I779 Disorder of arteries and arterioles, unspecified: Secondary | ICD-10-CM | POA: Diagnosis not present

## 2017-03-02 DIAGNOSIS — L97529 Non-pressure chronic ulcer of other part of left foot with unspecified severity: Secondary | ICD-10-CM | POA: Diagnosis not present

## 2017-03-02 NOTE — Patient Instructions (Signed)
  To decrease swelling in your feet and legs: Elevate feet above slightly bent knees, feet above heart, overnight and 3-4 times per day for 20 minutes.    Peripheral Vascular Disease Peripheral vascular disease (PVD) is a disease of the blood vessels that are not part of your heart and brain. A simple term for PVD is poor circulation. In most cases, PVD narrows the blood vessels that carry blood from your heart to the rest of your body. This can result in a decreased supply of blood to your arms, legs, and internal organs, like your stomach or kidneys. However, it most often affects a person's lower legs and feet. There are two types of PVD.  Organic PVD. This is the more common type. It is caused by damage to the structure of blood vessels.  Functional PVD. This is caused by conditions that make blood vessels contract and tighten (spasm).  Without treatment, PVD tends to get worse over time. PVD can also lead to acute ischemic limb. This is when an arm or limb suddenly has trouble getting enough blood. This is a medical emergency. Follow these instructions at home:  Take medicines only as told by your doctor.  Do not use any tobacco products, including cigarettes, chewing tobacco, or electronic cigarettes. If you need help quitting, ask your doctor.  Lose weight if you are overweight, and maintain a healthy weight as told by your doctor.  Eat a diet that is low in fat and cholesterol. If you need help, ask your doctor.  Exercise regularly. Ask your doctor for some good activities for you.  Take good care of your feet. ? Wear comfortable shoes that fit well. ? Check your feet often for any cuts or sores. Contact a doctor if:  You have cramps in your legs while walking.  You have leg pain when you are at rest.  You have coldness in a leg or foot.  Your skin changes.  You are unable to get or have an erection (erectile dysfunction).  You have cuts or sores on your feet that  are not healing. Get help right away if:  Your arm or leg turns cold and blue.  Your arms or legs become red, warm, swollen, painful, or numb.  You have chest pain or trouble breathing.  You suddenly have weakness in your face, arm, or leg.  You become very confused or you cannot speak.  You suddenly have a very bad headache.  You suddenly cannot see. This information is not intended to replace advice given to you by your health care provider. Make sure you discuss any questions you have with your health care provider. Document Released: 04/20/2009 Document Revised: 07/02/2015 Document Reviewed: 07/04/2013 Elsevier Interactive Patient Education  2017 Elsevier Inc.  

## 2017-03-02 NOTE — Progress Notes (Signed)
[facility] [place] [facility]   CC: Bilateral feet ulcers, hx of peripheral artery occlusive disease  History of Present Illness Jean Moore is a 84 y.o. female with previous discoloration of left toes as well as the ulceration of the left foot on the medial aspect. She is s/p retrograde tibial revascularization on 11-14-16 by Dr. Randie Heinz.  She has improvement in the toes as well as the pain.  The ulceration is slowly healing.    Dr. Randie Heinz last evaluated pt on 01-13-17. At that time pt had a good signal in her foot, was looking much better.  Pt was to follow up in 6 months with repeat ABIs.  Family demonstrated good understanding.  If there were issues between now and then we can certainly see her sooner.  Pt was evaluated by Dr. Renaldo Reel, PCP office, on 02-24-17. Her note indicates left foot x-ray negative for osteomyelitis. Right great toe wound was superficial and without signs of infection. She was treated for soft tissue infection with doxycycline 5 days. Wound care referral was also placed. Given her history of requiring intervention for PAD, Dr. Renaldo Reel advised patient to contact her vascular surgeon's office to be seen for these wounds.  Pt returns today for this.  HH is visiting for wound care, evaluation only so far according to son.  Pt denies fever or chills, states significant pain in left great toe and 1st metatarsus. She got a new hospital bed at home and is able to sleep better with less pain in her left foot.   Pt denies any known histroy of stroke or TIA.   She has known atrial fib, takes no anticoagulant, does take Plavix and ASA. Daily.     Diabetic: No Tobacco use: non-smoker  Pt meds include: Statin :Yes Betablocker: Yes ASA: Yes Other anticoagulants/antiplatelets: Plavix  Past Medical History:  Diagnosis Date  . Aortic stenosis   . Atrial fibrillation (HCC)    on coumadin since 2012, recently stopped because of vaginal bleeding  . CHF (congestive heart  failure) (HCC)   . Hyperlipidemia   . Hypertension   . Routine adult health maintenance 04/09/2012    Social History Social History   Tobacco Use  . Smoking status: Never Smoker  . Smokeless tobacco: Never Used  Substance Use Topics  . Alcohol use: No    Alcohol/week: 0.0 oz  . Drug use: No    Family History Family History  Problem Relation Age of Onset  . Hypertension Mother   . Hypertension Father   . Thyroid disease Neg Hx     Past Surgical History:  Procedure Laterality Date  . ABDOMINAL AORTOGRAM W/LOWER EXTREMITY N/A 10/26/2016   Procedure: ABDOMINAL AORTOGRAM W/LOWER EXTREMITY;  Surgeon: Maeola Harman, MD;  Location: [facility] INVASIVE CV LAB;  Service: Cardiovascular;  Laterality: N/A;  . NO PAST SURGERIES    . PERIPHERAL VASCULAR BALLOON ANGIOPLASTY Left 11/14/2016   Procedure: PERIPHERAL VASCULAR BALLOON ANGIOPLASTY;  Surgeon: Maeola Harman, MD;  Location: [facility] INVASIVE CV LAB;  Service: Cardiovascular;  Laterality: Left;  Posterior tibial    Allergies  Allergen Reactions  . Shrimp [Shellfish Allergy] Other (See Comments)    Reaction: Hives, Itching, Bumps, Swelling    Current Outpatient Medications  Medication Sig Dispense Refill  . acetaminophen (TYLENOL) 500 MG tablet Take 500 mg by mouth every 6 (six) hours as needed for pain.    Marland Kitchen aspirin 81 MG tablet Take 1 tablet (81 mg total) by mouth daily. 120 tablet  5  . atorvastatin (LIPITOR) 40 MG tablet TAKE 1 TABLET BY MOUTH DAILY AT 6 PM. (Patient taking differently: TAKE 1 TABLET BY MOUTH DAILY) 90 tablet 1  . clopidogrel (PLAVIX) 75 MG tablet Take 1 tablet (75 mg total) by mouth daily. 30 tablet 3  . COMBIGAN 0.2-0.5 % ophthalmic solution Place 1 drop into both eyes 2 (two) times daily.  4  . diclofenac sodium (VOLTAREN) 1 % GEL Apply 2 g topically 4 (four) times daily. 100 g 2  . digoxin (LANOXIN) 0.125 MG tablet Take 1 tablet (0.125 mg total) by mouth daily. 30 tablet 11  . dorzolamide  (TRUSOPT) 2 % ophthalmic solution Place 1 drop into both eyes 2 (two) times daily.  4  . furosemide (LASIX) 40 MG tablet Take 2 tablets (80 mg total) 2 (two) times daily by mouth. 180 tablet 0  . latanoprost (XALATAN) 0.005 % ophthalmic solution Place 1 drop into both eyes at bedtime.  4  . Menthol (ICY HOT BACK) 5 % PTCH Apply 1 patch topically as needed (pain).    . metoprolol succinate (TOPROL-XL) 50 MG 24 hr tablet Take 1 tablet (50 mg total) by mouth daily. Take with or immediately following a meal. 90 tablet 0  . Multiple Vitamins-Minerals (CENTRUM SILVER 50+WOMEN PO) Take 1 tablet daily by mouth.    . nystatin (MYCOSTATIN) 100000 UNIT/ML suspension Take 5 mLs (500,000 Units total) by mouth 4 (four) times daily. 60 mL 5  . Polyethyl Glycol-Propyl Glycol (SYSTANE ULTRA OP) Place 1 drop into both eyes 4 (four) times daily as needed (dry eyes).    . potassium chloride SA (K-DUR,KLOR-CON) 20 MEQ tablet TAKE 1 TABLET BY MOUTH 2 TIMES DAILY. 60 tablet 5   No current facility-administered medications for this visit.     ROS: See HPI for pertinent positives and negatives.   Physical Examination  Vitals:   03/02/17 1016  BP: 92/65  Pulse: 62  Resp: 16  Temp: (!) 97 F (36.1 C)  TempSrc: Oral  Weight: 137 lb (62.1 kg)  Height: 5\' 3"  (1.6 m)   Body mass index is 24.27 kg/m.  General: A&O x 3, WDWN, elderly female. Gait: seated in w/c HENT: No gross abnormalities.  Eyes: PERRLA. Pulmonary: Respirations are non labored, CTAB, good air movement in all fields Cardiac: Irregular rhythm with controlled rate, no detected murmur.         Carotid Bruits Right Left   Positive Negative   Radial pulses are 1+ palpable bilaterally   Adominal aortic pulse is not palpable           Left foot   Left foot   Right foot   Right foot                  VASCULAR EXAM: Extremities with ischemic changes, improving right toe ulcer compared to photo at visit with Dr. Renaldo Reel on 02-24-17,  ulcer at medial aspect first metatarsal, mild erythema surrounding ulcer. No drainage.  1-2+ pitting and non pitting dependent edema in both feet and lower legs.  LE Pulses Right Left       FEMORAL  1+ palpable  2+ palpable        POPLITEAL  not palpable   not palpable       POSTERIOR TIBIAL  not palpable, no Doppler signal detected   not palpable, no Doppler signal detected        DORSALIS PEDIS      ANTERIOR TIBIAL not palpable, + fairly brisk Doppler signal  not palpable, + fairly brisk Doppler signal    Abdomen: soft, NT, no palpable masses. Skin: no rashes, see Extremities Musculoskeletal: no muscle wasting or atrophy.  Neurologic: A&O X 3; appropriate affect, Sensation is normal; MOTOR FUNCTION:  moving all extremities equally, motor strength 5/5 throughout. Speech is fluent/normal. CN 2-12 intact. Psychiatric: Thought content is normal, mood appropriate for clinical situation.     ASSESSMENT: Jean Moore is a 84 y.o. female with previous discoloration of left toes as well as the ulceration of the left foot on the medial aspect. She is s/p retrograde tibial revascularization on 11-14-16 by Dr. Randie Heinz. She returns with significant pain in left great toe and 1st metatarsus. Left foot xrays by her PCP on 02-24-17 were negative for osteomyelitis. Pt took a course of doxycycline x 5 days. Right toe ulcer seems improved since then.   She has dependent edema in both feet and lower legs.   Right carotid bruit, no hx of stroke, last carotid duplex on file request ed by her cardiologist, dine in 2016, 60-79% right ICA stenosis, 1-39% left ICA stenosis. Will need a repeat carotid duplex at some point soon.   Abnormal structure noted in the bilateral thyroid,. A dedicated thyroid ultrasound is recommended. Son states this was not done, he knows nothing of this; I advised son to discuss  this with pt PCP.    DATA  None today.  Carotid Duple (05-14-14) requested by Dr. Verdis Prime: Heterogeneous plaque, bilaterally. 60-79% RICA stenosis. 1-39% LICA stenosis. Normal subclavian arteries, bilaterally. Patent vertebral arteries with antegrade flow. Abnormal structure noted in the bilateral thyroid, as described above. A dedicated thyroid ultrasound is recommended.  f/u 6 months.   10-26-16 Aortogram, studied left LE, Dr. Randie Heinz: Findings: Aorta iliac and femoral systems allhave significant non-flow-limiting calcifications. She does have severe slowing of contrast suggestive of low ejection fraction. The SFA has multiple levels of 30% stenosis that is not flow-limiting as does the popliteal with an area of 50% stenosis. The below-knee popliteal artery occludes and reconstitutes a distal peroneal artery as well as a dorsalis pedis artery. We did attempt across the peroneal artery but were unable to. Patient will need continued wound care with possible consideration of retrograde cannulation if she cannot heal her toe wound.  Serum creatinine on 02-24-17 was 1.12  PLAN:  Based on the patient's vascular studies and examination, pt will return to clinic in in about 2 weeks with bilateral LE arterial duplex, see Dr. Randie Heinz afterward.   To decrease swelling in your foot and leg: Elevate feet above slightly bent knees, feet above heart, overnight and 3-4 times per day for 20 minutes.  I discussed in depth with the patient the nature of atherosclerosis, and emphasized the importance of maximal medical management including strict control of blood pressure, blood glucose, and lipid levels, obtaining regular exercise, and continued cessation of smoking.  The patient is aware that without maximal medical management the underlying atherosclerotic disease process will progress, limiting the benefit of any interventions.  The patient  was given information about PAD including signs, symptoms,  treatment, what symptoms should prompt the patient to seek immediate medical care, and risk reduction measures to take.  Charisse March, RN, MSN, FNP-C [facility] Phone: [phone]  Clinic MD: [facility]  03/02/17 10:25 AM

## 2017-03-05 ENCOUNTER — Other Ambulatory Visit: Payer: Self-pay | Admitting: Vascular Surgery

## 2017-03-06 MED FILL — POTASSIUM CL ER 20 MEQ TABL: 20 | 30 days supply | Qty: 60 | Fill #1

## 2017-03-06 MED FILL — COMBIGAN EYE DROPS: 0.2-0.5 | 25 days supply | Qty: 5 | Fill #0

## 2017-03-06 NOTE — Addendum Note (Signed)
Addended by: Jaquez Farrington A on: 03/06/2017 02:52 PM   Modules accepted: Orders  

## 2017-03-10 MED FILL — FUROSEMIDE 40 MG TAB: 40 | 45 days supply | Qty: 180 | Fill #5

## 2017-03-13 ENCOUNTER — Emergency Department (HOSPITAL_COMMUNITY): Payer: Medicare Other

## 2017-03-13 ENCOUNTER — Other Ambulatory Visit: Payer: Self-pay

## 2017-03-13 ENCOUNTER — Encounter (HOSPITAL_COMMUNITY): Payer: Self-pay | Admitting: Emergency Medicine

## 2017-03-13 ENCOUNTER — Inpatient Hospital Stay (HOSPITAL_COMMUNITY)
Admission: EM | Admit: 2017-03-13 | Discharge: 2017-03-17 | DRG: 291 | Disposition: A | Discharge status: Skilled Nursing Facility | Payer: Medicare Other | Attending: Internal Medicine | Admitting: Internal Medicine

## 2017-03-13 DIAGNOSIS — N183 Chronic kidney disease, stage 3 (moderate): Secondary | ICD-10-CM | POA: Diagnosis present

## 2017-03-13 DIAGNOSIS — T502X5A Adverse effect of carbonic-anhydrase inhibitors, benzothiadiazides and other diuretics, initial encounter: Secondary | ICD-10-CM | POA: Diagnosis present

## 2017-03-13 DIAGNOSIS — I509 Heart failure, unspecified: Secondary | ICD-10-CM | POA: Diagnosis not present

## 2017-03-13 DIAGNOSIS — Z7982 Long term (current) use of aspirin: Secondary | ICD-10-CM | POA: Diagnosis not present

## 2017-03-13 DIAGNOSIS — E875 Hyperkalemia: Secondary | ICD-10-CM | POA: Diagnosis present

## 2017-03-13 DIAGNOSIS — I5023 Acute on chronic systolic (congestive) heart failure: Secondary | ICD-10-CM | POA: Diagnosis present

## 2017-03-13 DIAGNOSIS — Z79899 Other long term (current) drug therapy: Secondary | ICD-10-CM

## 2017-03-13 DIAGNOSIS — I350 Nonrheumatic aortic (valve) stenosis: Secondary | ICD-10-CM

## 2017-03-13 DIAGNOSIS — E1122 Type 2 diabetes mellitus with diabetic chronic kidney disease: Secondary | ICD-10-CM | POA: Diagnosis present

## 2017-03-13 DIAGNOSIS — E042 Nontoxic multinodular goiter: Secondary | ICD-10-CM | POA: Diagnosis present

## 2017-03-13 DIAGNOSIS — I482 Chronic atrial fibrillation, unspecified: Secondary | ICD-10-CM | POA: Diagnosis present

## 2017-03-13 DIAGNOSIS — Z91013 Allergy to seafood: Secondary | ICD-10-CM

## 2017-03-13 DIAGNOSIS — I10 Essential (primary) hypertension: Secondary | ICD-10-CM | POA: Diagnosis not present

## 2017-03-13 DIAGNOSIS — N189 Chronic kidney disease, unspecified: Secondary | ICD-10-CM | POA: Diagnosis present

## 2017-03-13 DIAGNOSIS — I739 Peripheral vascular disease, unspecified: Secondary | ICD-10-CM | POA: Diagnosis present

## 2017-03-13 DIAGNOSIS — N184 Chronic kidney disease, stage 4 (severe): Secondary | ICD-10-CM | POA: Diagnosis not present

## 2017-03-13 DIAGNOSIS — Z7902 Long term (current) use of antithrombotics/antiplatelets: Secondary | ICD-10-CM | POA: Diagnosis not present

## 2017-03-13 DIAGNOSIS — N179 Acute kidney failure, unspecified: Secondary | ICD-10-CM | POA: Diagnosis present

## 2017-03-13 DIAGNOSIS — E785 Hyperlipidemia, unspecified: Secondary | ICD-10-CM | POA: Diagnosis present

## 2017-03-13 DIAGNOSIS — Z8249 Family history of ischemic heart disease and other diseases of the circulatory system: Secondary | ICD-10-CM | POA: Diagnosis not present

## 2017-03-13 DIAGNOSIS — I130 Hypertensive heart and chronic kidney disease with heart failure and stage 1 through stage 4 chronic kidney disease, or unspecified chronic kidney disease: Principal | ICD-10-CM | POA: Diagnosis present

## 2017-03-13 DIAGNOSIS — Z515 Encounter for palliative care: Secondary | ICD-10-CM | POA: Diagnosis not present

## 2017-03-13 DIAGNOSIS — Z7189 Other specified counseling: Secondary | ICD-10-CM | POA: Diagnosis not present

## 2017-03-13 DIAGNOSIS — E440 Moderate protein-calorie malnutrition: Secondary | ICD-10-CM

## 2017-03-13 DIAGNOSIS — Z66 Do not resuscitate: Secondary | ICD-10-CM | POA: Diagnosis present

## 2017-03-13 DIAGNOSIS — I428 Other cardiomyopathies: Secondary | ICD-10-CM | POA: Diagnosis present

## 2017-03-13 DIAGNOSIS — R0902 Hypoxemia: Secondary | ICD-10-CM

## 2017-03-13 DIAGNOSIS — R0602 Shortness of breath: Secondary | ICD-10-CM

## 2017-03-13 DIAGNOSIS — E1151 Type 2 diabetes mellitus with diabetic peripheral angiopathy without gangrene: Secondary | ICD-10-CM | POA: Diagnosis present

## 2017-03-13 DIAGNOSIS — I071 Rheumatic tricuspid insufficiency: Secondary | ICD-10-CM | POA: Diagnosis present

## 2017-03-13 DIAGNOSIS — K529 Noninfective gastroenteritis and colitis, unspecified: Secondary | ICD-10-CM | POA: Diagnosis present

## 2017-03-13 HISTORY — DX: Dyspnea, unspecified: R06.00

## 2017-03-13 LAB — CBC WITH DIFFERENTIAL/PLATELET
BASOS ABS: 0 10*3/uL (ref 0.0–0.1)
Basophils Relative: 0 %
EOS ABS: 0 10*3/uL (ref 0.0–0.7)
EOS PCT: 0 %
HCT: 41.7 % (ref 36.0–46.0)
HEMOGLOBIN: 13.2 g/dL (ref 12.0–15.0)
LYMPHS ABS: 0.8 10*3/uL (ref 0.7–4.0)
Lymphocytes Relative: 6 %
MCH: 27.3 pg (ref 26.0–34.0)
MCHC: 31.7 g/dL (ref 30.0–36.0)
MCV: 86.3 fL (ref 78.0–100.0)
Monocytes Absolute: 1 10*3/uL (ref 0.1–1.0)
Monocytes Relative: 7 %
NEUTROS PCT: 87 %
Neutro Abs: 12.7 10*3/uL — ABNORMAL HIGH (ref 1.7–7.7)
PLATELETS: 136 10*3/uL — AB (ref 150–400)
RBC: 4.83 MIL/uL (ref 3.87–5.11)
RDW: 18.2 % — ABNORMAL HIGH (ref 11.5–15.5)
WBC: 14.5 10*3/uL — AB (ref 4.0–10.5)

## 2017-03-13 LAB — COMPREHENSIVE METABOLIC PANEL
ALT: 40 U/L (ref 14–54)
AST: 54 U/L — ABNORMAL HIGH (ref 15–41)
Albumin: 2.8 g/dL — ABNORMAL LOW (ref 3.5–5.0)
Alkaline Phosphatase: 102 U/L (ref 38–126)
Anion gap: 12 (ref 5–15)
BILIRUBIN TOTAL: 1.5 mg/dL — AB (ref 0.3–1.2)
BUN: 35 mg/dL — ABNORMAL HIGH (ref 6–20)
CHLORIDE: 98 mmol/L — AB (ref 101–111)
CO2: 24 mmol/L (ref 22–32)
CREATININE: 1.36 mg/dL — AB (ref 0.44–1.00)
Calcium: 9 mg/dL (ref 8.9–10.3)
GFR, EST AFRICAN AMERICAN: 39 mL/min — AB (ref >60)
GFR, EST NON AFRICAN AMERICAN: 34 mL/min — AB (ref >60)
Glucose, Bld: 196 mg/dL — ABNORMAL HIGH (ref 65–99)
POTASSIUM: 6.2 mmol/L — AB (ref 3.5–5.1)
Sodium: 134 mmol/L — ABNORMAL LOW (ref 135–145)
TOTAL PROTEIN: 7.7 g/dL (ref 6.5–8.1)

## 2017-03-13 LAB — BRAIN NATRIURETIC PEPTIDE: B Natriuretic Peptide: >4500 pg/mL — ABNORMAL HIGH (ref 0.0–100.0)

## 2017-03-13 LAB — BASIC METABOLIC PANEL
Anion gap: 16 — ABNORMAL HIGH (ref 5–15)
BUN: 38 mg/dL — AB (ref 6–20)
CALCIUM: 9.2 mg/dL (ref 8.9–10.3)
CO2: 21 mmol/L — ABNORMAL LOW (ref 22–32)
Chloride: 98 mmol/L — ABNORMAL LOW (ref 101–111)
Creatinine, Ser: 1.56 mg/dL — ABNORMAL HIGH (ref 0.44–1.00)
GFR calc Af Amer: 33 mL/min — ABNORMAL LOW (ref >60)
GFR, EST NON AFRICAN AMERICAN: 29 mL/min — AB (ref >60)
GLUCOSE: 146 mg/dL — AB (ref 65–99)
Potassium: 6.4 mmol/L (ref 3.5–5.1)
SODIUM: 135 mmol/L (ref 135–145)

## 2017-03-13 LAB — TROPONIN I
TROPONIN I: 0.11 ng/mL — AB (ref <0.03)
Troponin I: 0.11 ng/mL (ref <0.03)

## 2017-03-13 LAB — I-STAT TROPONIN, ED: TROPONIN I, POC: 0.14 ng/mL — AB (ref 0.00–0.08)

## 2017-03-13 LAB — DIGOXIN LEVEL: Digoxin Level: 2.2 ng/mL — ABNORMAL HIGH (ref 0.8–2.0)

## 2017-03-13 MED ORDER — ONDANSETRON HCL 4 MG/2ML IJ SOLN
4.0000 mg | Freq: Once | INTRAMUSCULAR | Status: AC
  Administered 2017-03-13: 4 mg via INTRAVENOUS

## 2017-03-13 MED ORDER — HEPARIN SODIUM (PORCINE) 5000 UNIT/ML IJ SOLN
5000.0000 [IU] | Freq: Two times a day (BID) | INTRAMUSCULAR | Status: DC
  Administered 2017-03-14 – 2017-03-17 (×7): 5000 [IU] via SUBCUTANEOUS
  Filled 2017-03-13 (×8): qty 1

## 2017-03-13 MED ORDER — SODIUM CHLORIDE 0.9% FLUSH
3.0000 mL | Freq: Two times a day (BID) | INTRAVENOUS | Status: DC
  Administered 2017-03-13 – 2017-03-17 (×8): 3 mL via INTRAVENOUS

## 2017-03-13 MED ORDER — ASPIRIN EC 81 MG PO TBEC
81.0000 mg | DELAYED_RELEASE_TABLET | Freq: Every day | ORAL | Status: DC
  Administered 2017-03-14 – 2017-03-17 (×4): 81 mg via ORAL
  Filled 2017-03-13 (×4): qty 1

## 2017-03-13 MED ORDER — DIGOXIN 125 MCG PO TABS
0.1250 mg | ORAL_TABLET | Freq: Every day | ORAL | Status: DC
  Administered 2017-03-14 – 2017-03-17 (×4): 0.125 mg via ORAL
  Filled 2017-03-13 (×4): qty 1

## 2017-03-13 MED ORDER — ACETAMINOPHEN 325 MG PO TABS: 650.0000 mg | ORAL_TABLET | ORAL | Status: DC | PRN

## 2017-03-13 MED ORDER — METOPROLOL SUCCINATE ER 50 MG PO TB24
50.0000 mg | ORAL_TABLET | Freq: Every day | ORAL | Status: DC
  Administered 2017-03-14 – 2017-03-17 (×4): 50 mg via ORAL
  Filled 2017-03-13 (×4): qty 1

## 2017-03-13 MED ORDER — SODIUM CHLORIDE 0.9 % IV SOLN
1.0000 g | Freq: Once | INTRAVENOUS | Status: AC
  Administered 2017-03-13: 1 g via INTRAVENOUS
  Filled 2017-03-13: qty 10

## 2017-03-13 MED ORDER — SODIUM CHLORIDE 0.9% FLUSH: 3.0000 mL | INTRAVENOUS | Status: DC | PRN

## 2017-03-13 MED ORDER — LATANOPROST 0.005 % OP SOLN
1.0000 [drp] | Freq: Every day | OPHTHALMIC | Status: DC
  Administered 2017-03-14 – 2017-03-16 (×3): 1 [drp] via OPHTHALMIC
  Filled 2017-03-13: qty 2.5

## 2017-03-13 MED ORDER — FUROSEMIDE 10 MG/ML IJ SOLN
40.0000 mg | Freq: Once | INTRAMUSCULAR | Status: AC
  Administered 2017-03-13: 40 mg via INTRAVENOUS
  Filled 2017-03-13: qty 4

## 2017-03-13 MED ORDER — CLOPIDOGREL BISULFATE 75 MG PO TABS
75.0000 mg | ORAL_TABLET | Freq: Every day | ORAL | Status: DC
  Administered 2017-03-14 – 2017-03-17 (×4): 75 mg via ORAL
  Filled 2017-03-13 (×4): qty 1

## 2017-03-13 MED ORDER — SODIUM CHLORIDE 0.9 % IV SOLN: 250.0000 mL | INTRAVENOUS | Status: DC | PRN

## 2017-03-13 MED ORDER — IPRATROPIUM-ALBUTEROL 0.5-2.5 (3) MG/3ML IN SOLN: 3.0000 mL | Freq: Four times a day (QID) | RESPIRATORY_TRACT | Status: DC | PRN

## 2017-03-13 MED ORDER — ATORVASTATIN CALCIUM 40 MG PO TABS
40.0000 mg | ORAL_TABLET | Freq: Every day | ORAL | Status: DC
  Administered 2017-03-14 – 2017-03-17 (×4): 40 mg via ORAL
  Filled 2017-03-13 (×4): qty 1

## 2017-03-13 MED ORDER — BRIMONIDINE TARTRATE-TIMOLOL 0.2-0.5 % OP SOLN: 1.0000 [drp] | Freq: Two times a day (BID) | OPHTHALMIC | Status: DC

## 2017-03-13 MED ORDER — SODIUM POLYSTYRENE SULFONATE 15 GM/60ML PO SUSP
30.0000 g | Freq: Once | ORAL | Status: AC
  Administered 2017-03-13: 30 g via ORAL
  Filled 2017-03-13: qty 120

## 2017-03-13 MED ORDER — DORZOLAMIDE HCL 2 % OP SOLN
1.0000 [drp] | Freq: Two times a day (BID) | OPHTHALMIC | Status: DC
  Administered 2017-03-14 – 2017-03-17 (×7): 1 [drp] via OPHTHALMIC
  Filled 2017-03-13: qty 10

## 2017-03-13 MED ORDER — FUROSEMIDE 10 MG/ML IJ SOLN
40.0000 mg | Freq: Two times a day (BID) | INTRAMUSCULAR | Status: DC
  Administered 2017-03-14 – 2017-03-15 (×4): 40 mg via INTRAVENOUS
  Filled 2017-03-13 (×5): qty 4

## 2017-03-13 MED ORDER — ONDANSETRON HCL 4 MG/2ML IJ SOLN: 4.0000 mg | Freq: Four times a day (QID) | INTRAMUSCULAR | Status: DC | PRN

## 2017-03-13 MED ORDER — POLYVINYL ALCOHOL 1.4 % OP SOLN: Freq: Four times a day (QID) | OPHTHALMIC | Status: DC | PRN

## 2017-03-13 NOTE — ED Notes (Signed)
Difficulty getting a pulse ox reading after many attempts  An occassional reading of 94% on room air

## 2017-03-13 NOTE — Consult Note (Signed)
Cardiology Consultation:   Patient ID: Jean Moore; [id number]; July 15, 1926   Admit date: 03/13/2017 Date of Consult: 03/13/2017  Primary Care Provider: Nyra Market, MD Primary Cardiologist: Dr Garnette Scheuermann Primary Electrophysiologist:  n/a   Patient Profile:   Jean Moore is a 84 y.o. female with a hx of chronic systolic HF who is being seen today for the evaluation of acute on chronic systolic CHF at the request of Dr August Saucer   History of Present Illness:   Ms. Hornbuckle female history of NICM/chronic systolic HF, chronic hypotension, severe AS, afib has refused coumadin. From notes patient has been resistant to medication changes, has refused ICD, and refused prior invasive procedures and thus valve intervention was not pursued.  History of bilateral foot ulcers and PAD with prior retrograde tibial revac 11/2016.   Presents with several days of progressiong SOB and LE edema. Denies any chest pain. Reports worsening pain in her right foot. No fevers or chillls, no cough.    Na 134 K 6.2 Cr 1.36 AST 54 GFR 34 WBC 14.5 BNP >4500 Digoxin 2.2 Trop 0.11-->0.11 CXR left basilar changes, ? Effusion and underlying infiltrate Echo 03/2016: LVEF 15%, flattened ventricular septum, severe AS, moderate RV dysfunction, severe TR Past Medical History:  Diagnosis Date  . Aortic stenosis   . Atrial fibrillation (HCC)    on coumadin since 2012, recently stopped because of vaginal bleeding  . CHF (congestive heart failure) (HCC)   . Hyperlipidemia   . Hypertension   . Routine adult health maintenance 04/09/2012    Past Surgical History:  Procedure Laterality Date  . ABDOMINAL AORTOGRAM W/LOWER EXTREMITY N/A 10/26/2016   Procedure: ABDOMINAL AORTOGRAM W/LOWER EXTREMITY;  Surgeon: Maeola Harman, MD;  Location: [facility] INVASIVE CV LAB;  Service: Cardiovascular;  Laterality: N/A;  . NO PAST SURGERIES    . PERIPHERAL VASCULAR BALLOON ANGIOPLASTY Left 11/14/2016   Procedure: PERIPHERAL VASCULAR  BALLOON ANGIOPLASTY;  Surgeon: Maeola Harman, MD;  Location: [facility] INVASIVE CV LAB;  Service: Cardiovascular;  Laterality: Left;  Posterior tibial      Inpatient Medications: Scheduled Meds:  Continuous Infusions:  PRN Meds:   Allergies:    Allergies  Allergen Reactions  . Shrimp [Shellfish Allergy] Other (See Comments)    Reaction: Hives, Itching, Bumps, Swelling    Social History:   Social History   Socioeconomic History  . Marital status: Widowed    Spouse name: Not on file  . Number of children: Not on file  . Years of education: Not on file  . Highest education level: Not on file  Social Needs  . Financial resource strain: Not on file  . Food insecurity - worry: Not on file  . Food insecurity - inability: Not on file  . Transportation needs - medical: Not on file  . Transportation needs - non-medical: Not on file  Occupational History  . Not on file  Tobacco Use  . Smoking status: Never Smoker  . Smokeless tobacco: Never Used  Substance and Sexual Activity  . Alcohol use: No    Alcohol/week: 0.0 oz  . Drug use: No  . Sexual activity: Not on file  Other Topics Concern  . Not on file  Social History Narrative  . Not on file    Family History:   Family History  Problem Relation Age of Onset  . Hypertension Mother   . Hypertension Father   . Thyroid disease Neg Hx      ROS:  Please see the history  of present illness.  All other ROS reviewed and negative.     Physical Exam/Data:   Vitals:   03/13/17 1630 03/13/17 1645 03/13/17 1700 03/13/17 1800  BP:  99/67 100/68 (!) 100/55  Pulse: 63  (!) 54   Resp: (!) 23 (!) 24 (!) 23 (!) 37  Temp:      TempSrc:      SpO2: 100%  99%   Weight:      Height:       No intake or output data in the 24 hours ending 03/13/17 1958 Filed Weights   03/13/17 1557  Weight: 137 lb (62.1 kg)   Body mass index is 24.27 kg/m.  General:  Well nourished, well developed, in no acute distress HEENT:  normal Lymph: no adenopathy Neck: elevated jvd Endocrine:  No thryomegaly Cardiac:  Irreg, 3/6 sysotlic murmur rusb, Lungs:  clear to auscultation bilaterally, no wheezing, rhonchi or rales  Abd: soft, nontender, no hepatomegaly  Ext: no edema Musculoskeletal:  No deformities, BUE and BLE strength normal and equal Skin: warm and dry  Neuro:  CNs 2-12 intact, no focal abnormalities noted Psych:  Normal affect    Laboratory Data:  Chemistry Recent Labs  Lab 03/13/17 1550  NA 134*  K 6.2*  CL 98*  CO2 24  GLUCOSE 196*  BUN 35*  CREATININE 1.36*  CALCIUM 9.0  GFRNONAA 34*  GFRAA 39*  ANIONGAP 12    Recent Labs  Lab 03/13/17 1550  PROT 7.7  ALBUMIN 2.8*  AST 54*  ALT 40  ALKPHOS 102  BILITOT 1.5*   Hematology Recent Labs  Lab 03/13/17 1550  WBC 14.5*  RBC 4.83  HGB 13.2  HCT 41.7  MCV 86.3  MCH 27.3  MCHC 31.7  RDW 18.2*  PLT 136*   Cardiac Enzymes Recent Labs  Lab 03/13/17 1550 03/13/17 1600  TROPONINI 0.11* 0.11*   No results for input(s): TROPIPOC in the last 168 hours.  BNP Recent Labs  Lab 03/13/17 1550  BNP >4,500.0*    DDimer No results for input(s): DDIMER in the last 168 hours.  Radiology/Studies:  Dg Chest Portable 1 View  Result Date: 03/13/2017 CLINICAL DATA:  Shortness of breath for several days EXAM: PORTABLE CHEST 1 VIEW COMPARISON:  12/28/2016 FINDINGS: Cardiac shadow is enlarged. Aortic calcifications are again seen. The lungs are well aerated bilaterally. New left basilar infiltrate with associated effusion is noted. No acute bony abnormality is seen. IMPRESSION: Left basilar changes consistent with effusion and underlying infiltrate. Electronically Signed   By: Alcide Clever M.D.   On: 03/13/2017 16:16    Assessment and Plan:   1. Acute on chronic systolic HF - echo 03/2016 echo LVEF 15%, moderate RV dysfunction. Severe AS, severe TR - presents with evidence of fluid overload - medical therapy limited by soft bp's.  - would  lower TOprol to 25mg  daily due to soft bp's. Has not been on ACE/ARB/ARNI/aldactone. Would not start due to low bp's, renal dysfunciton, and hyperkalemia  2. Leukocytosis - evaluation per primary team. With her foot ulcers there was some concern about possible osteomyelitis last month according to vascular notes, xrays were negative. From notes completed course of oral doxy. Reports worsening pain in foot. Consider repeat eval for osteo.  - CXR also with possible infiltrate - would diurese with lasix 60mg  IV bid.   3. Hyperkalemia - repeat stat K pending.  - EKG afib, could argue lateral precordial T waves more prominent, QRS width stable at 140  baseline - will give calcium gluconate now, defer if repeat K is elevated to primary team for further management - she is on KCl at home, no other hyperK associated meds.   4. Severe AS - low flow low gradient severe AS by last echo with VTI dimensionless inex 0.19, AVA VTI 0.71, mean grad 17 - from clinic notes further workup/intervention not pursued as patient has been resistant to invasive procedures  5. Severe TR  6. Elevated troponin - mild and flat, chronic. Overall nonspecific finding in setting of CHF. Do not suspect ACS.   7. PAD - recent intervention by vascular for LE disease for nonhealing ulcers  8. Afib - continue rate control with lopressor, digoxin. Patient has refused anticoag.   For questions or updates, please contact [facility] Please consult [email or web address] for contact info under Cardiology/STEMI.   Joanie Coddington, MD  03/13/2017 7:58 PM

## 2017-03-13 NOTE — ED Notes (Signed)
Critical troponin called from lab.  Dr. Jeraldine Loots made aware

## 2017-03-13 NOTE — H&P (Signed)
PCP:   Nyra Market, MD   Chief Complaint: SOB   HPI: this is 84 y/o female who presents with c/o diarrhea since yesterday and again this AM. None in ER. In the ER she developed nausea and vomitng, improved with zofran. She also reports some SOB, mild. She denies chest pain. Over the past 2 weeks she's had increased edema on LE but family reports stable weights.she is weak, now mostly using the wheelchair due to increased respiratory effeot. There is no wheezing, no cough, no fevers, no chills. Her urine output remains unchanged. She has had no sick contacts.  History provided by the patient and her son who is present at bedside.  Review of Systems:  The patient denies anorexia, fever, weight loss,, vision loss, decreased hearing, hoarseness, chest pain, syncope,SOB, peripheral edema, balance deficits, hemoptysis, abdominal pain, nausea, vomiting, diarrhea,  melena, hematochezia, severe indigestion/heartburn, hematuria, incontinence, genital sores, muscle weakness, suspicious skin lesions, transient blindness, difficulty walking, depression, unusual weight change, abnormal bleeding, enlarged lymph nodes, angioedema, and breast masses.  Past Medical History: Past Medical History:  Diagnosis Date  . Aortic stenosis   . Atrial fibrillation (HCC)    on coumadin since 2012, recently stopped because of vaginal bleeding  . CHF (congestive heart failure) (HCC)   . Hyperlipidemia   . Hypertension   . Routine adult health maintenance 04/09/2012   Past Surgical History:  Procedure Laterality Date  . ABDOMINAL AORTOGRAM W/LOWER EXTREMITY N/A 10/26/2016   Procedure: ABDOMINAL AORTOGRAM W/LOWER EXTREMITY;  Surgeon: Maeola Harman, MD;  Location: [facility] INVASIVE CV LAB;  Service: Cardiovascular;  Laterality: N/A;  . NO PAST SURGERIES    . PERIPHERAL VASCULAR BALLOON ANGIOPLASTY Left 11/14/2016   Procedure: PERIPHERAL VASCULAR BALLOON ANGIOPLASTY;  Surgeon: Maeola Harman, MD;  Location:  [facility] INVASIVE CV LAB;  Service: Cardiovascular;  Laterality: Left;  Posterior tibial    Medications: Prior to Admission medications   Medication Sig Start Date End Date Taking? Authorizing Provider  acetaminophen (TYLENOL) 500 MG tablet Take 500 mg by mouth every 6 (six) hours as needed for pain.   Yes [provider]  aspirin 81 MG tablet Take 1 tablet (81 mg total) by mouth daily. 05/06/13  Yes Clegg, Amy D, NP  atorvastatin (LIPITOR) 40 MG tablet TAKE 1 TABLET BY MOUTH DAILY AT 6 PM. Patient taking differently: TAKE 40 mg TABLET BY MOUTH in the evening 11/14/16  Yes Bensimhon, Bevelyn Buckles, MD  clopidogrel (PLAVIX) 75 MG tablet TAKE 1 TABLET BY MOUTH ONCE DAILY Patient taking differently: TAKE 75 mg TABLET BY MOUTH ONCE DAILY 03/05/17  Yes Maeola Harman, MD  COMBIGAN 0.2-0.5 % ophthalmic solution Place 1 drop into both eyes 2 (two) times daily. 09/20/16  Yes [provider]  digoxin (LANOXIN) 0.125 MG tablet Take 1 tablet (0.125 mg total) by mouth daily. 04/14/16  Yes Lyn Records, MD  dorzolamide (TRUSOPT) 2 % ophthalmic solution Place 1 drop into both eyes 2 (two) times daily. 09/22/16  Yes [provider]  furosemide (LASIX) 40 MG tablet Take 2 tablets (80 mg total) 2 (two) times daily by mouth. 12/15/16  Yes Ginger Carne, MD  latanoprost (XALATAN) 0.005 % ophthalmic solution Place 1 drop into both eyes at bedtime. 09/20/16  Yes [provider]  metoprolol succinate (TOPROL-XL) 50 MG 24 hr tablet Take 1 tablet (50 mg total) by mouth daily. Take with or immediately following a meal. 04/10/15  Yes Selina Cooley, MD  Multiple Vitamins-Minerals (CENTRUM SILVER 50+WOMEN PO)  Take 1 tablet daily by mouth.   Yes [provider]  Polyethyl Glycol-Propyl Glycol (SYSTANE ULTRA OP) Place 1 drop into both eyes 4 (four) times daily as needed (dry eyes).   Yes [provider]  potassium chloride SA (K-DUR,KLOR-CON) 20 MEQ tablet TAKE 1 TABLET BY MOUTH 2  TIMES DAILY. Patient taking differently: TAKE 20 meq TABLET BY MOUTH 2 TIMES DAILY. 02/02/17  Yes Nyra Market, MD  diclofenac sodium (VOLTAREN) 1 % GEL Apply 2 g topically 4 (four) times daily. Patient not taking: Reported on 03/13/2017 01/11/17   Nyra Market, MD  nystatin (MYCOSTATIN) 100000 UNIT/ML suspension Take 5 mLs (500,000 Units total) by mouth 4 (four) times daily. Patient not taking: Reported on 03/13/2017 01/11/17   Nyra Market, MD    Allergies:   Allergies  Allergen Reactions  . Shrimp [Shellfish Allergy] Other (See Comments)    Reaction: Hives, Itching, Bumps, Swelling    Social History:  reports that  has never smoked. she has never used smokeless tobacco. She reports that she does not drink alcohol or use drugs.  Family History: Family History  Problem Relation Age of Onset  . Hypertension Mother   . Hypertension Father   . Thyroid disease Neg Hx     Physical Exam: Vitals:   03/13/17 1645 03/13/17 1700 03/13/17 1800 03/13/17 1931  BP: 99/67 100/68 (!) 100/55 (!) 106/92  Pulse:  (!) 54    Resp: (!) 24 (!) 23 (!) 37 (!) 36  Temp:      TempSrc:      SpO2:  99%    Weight:      Height:        General:  Alert and oriented times three, cachectic and nourished, no acute distress, weak Eyes: PERRLA, pink conjunctiva, no scleral icterus ENT: Moist oral mucosa, neck supple, no thyromegaly Lungs: clear to ascultation, no wheeze, no crackles, no use of accessory muscles Cardiovascular: regular rate and rhythm, no regurgitation, no gallops, no murmurs. No carotid bruits, JVD Abdomen: soft, positive BS, non-tender, non-distended, no organomegaly, not an acute abdomen GU: not examined Neuro: CN II - XII grossly intact, sensation intact Musculoskeletal: strength 5/5 all extremities, no clubbing, cyanosis or edema + B/L LE Skin: no rash, no subcutaneous crepitation, no decubitus Psych: appropriate patient   Labs on Admission:  Recent Labs    03/13/17 1550   NA 134*  K 6.2*  CL 98*  CO2 24  GLUCOSE 196*  BUN 35*  CREATININE 1.36*  CALCIUM 9.0   Recent Labs    03/13/17 1550  AST 54*  ALT 40  ALKPHOS 102  BILITOT 1.5*  PROT 7.7  ALBUMIN 2.8*   No results for input(s): LIPASE, AMYLASE in the last 72 hours. Recent Labs    03/13/17 1550  WBC 14.5*  NEUTROABS 12.7*  HGB 13.2  HCT 41.7  MCV 86.3  PLT 136*   Recent Labs    03/13/17 1550 03/13/17 1600  TROPONINI 0.11* 0.11*   Invalid input(s): POCBNP No results for input(s): DDIMER in the last 72 hours. No results for input(s): HGBA1C in the last 72 hours. No results for input(s): CHOL, HDL, LDLCALC, TRIG, CHOLHDL, LDLDIRECT in the last 72 hours. No results for input(s): TSH, T4TOTAL, T3FREE, THYROIDAB in the last 72 hours.  Invalid input(s): FREET3 No results for input(s): VITAMINB12, FOLATE, FERRITIN, TIBC, IRON, RETICCTPCT in the last 72 hours.  Micro Results: No results found for this or any previous visit (from the past 240 hour(s)).  Radiological Exams on Admission: Dg Chest Portable 1 View  Result Date: 03/13/2017 CLINICAL DATA:  Shortness of breath for several days EXAM: PORTABLE CHEST 1 VIEW COMPARISON:  12/28/2016 FINDINGS: Cardiac shadow is enlarged. Aortic calcifications are again seen. The lungs are well aerated bilaterally. New left basilar infiltrate with associated effusion is noted. No acute bony abnormality is seen. IMPRESSION: Left basilar changes consistent with effusion and underlying infiltrate. Electronically Signed   By: Alcide Clever M.D.   On: 03/13/2017 16:16    LV EF: 15%  ------------------------------------------------------------------- Indications:      I35.9 Aortic Valve Disorder.  I48.2 Chronic Atrial Fibrillation.  ------------------------------------------------------------------- History:   PMH:  Acquired from the patient and from the patient&'s chart.  PMH:  CHF. Atrial Fibrillation.  Risk factors: Hypertension.  Dyslipidemia.  ------------------------------------------------------------------- Study Conclusions  - Left ventricle: The cavity size was normal. Wall thickness was   increased in a pattern of mild LVH. Systolic function was   severely reduced. The estimated ejection fraction was 15%.   Diffuse hypokinesis. - Ventricular septum: The contour showed diastolic flattening and   systolic flattening. - Aortic valve: Valve mobility was restricted. There was severe   stenosis. - Mitral valve: Calcified annulus. There was mild regurgitation. - Left atrium: The atrium was severely dilated. - Right ventricle: The cavity size was moderately dilated. Systolic   function was moderately reduced. - Right atrium: The atrium was severely dilated. - Atrial septum: The septum bowed from right to left, consistent   with increased right atrial pressure. There was an atrial septal   aneurysm. - Tricuspid valve: There was wide-open regurgitation. - Pulmonary arteries: Systolic pressure was increased. - Pericardium, extracardiac: A small pericardial effusion was   identified.  Impressions:  - Severe global reduction in LV systolic function; mild LVH;   calcified aortic valve with severe AS by continuity equation   (suggest dobutamine echocardiogram to further assess); mild MR;   severe biatrial enlargement; moderate RVE with moderately reduced   RV function; severe TR; small pericardial effusion; septal   flattening, bowing of atrial septum right to left and dilated IVC   suggestive of elevated right side pressures/pulmonary   hypertension (cannnot accurately estimate pulmonary pressure as   TR wide open).  ------------------------------------------------------------------- Study data:   Study status:  Routine.  Procedure:  The patient reported no pain pre or post test. Transthoracic echocardiography for left ventricular function evaluation, for right ventricular function evaluation, and for  assessment of valvular function. Image quality was adequate.  Study completion:  There were no complications.          Transthoracic echocardiography.  M-mode, complete 2D, spectral Doppler, and color Doppler.  Birthdate: Patient birthdate: 12-02-1926.  Age:  Patient is 84 yr old.  Sex: Gender: female.    BMI: 22.6 kg/m^2.  Patient status:  Outpatient. Study date:  Study date: 03/17/2016. Study time: 01:30 PM. Location:  [place] [facility]  -------------------------------------------------------------------  ------------------------------------------------------------------- Left ventricle:  The cavity size was normal. Wall thickness was increased in a pattern of mild LVH. Systolic function was severely reduced. The estimated ejection fraction was 15%. Diffuse hypokinesis. The study was not technically sufficient to allow evaluation of LV diastolic dysfunction due to atrial fibrillation.   ------------------------------------------------------------------- Aortic valve:   Trileaflet; severely calcified leaflets. Valve mobility was restricted.  Doppler:   There was severe stenosis. There was no regurgitation.    VTI ratio of LVOT to aortic valve: 0.19. Valve area (VTI): 0.71 cm^2. Indexed valve area (  VTI): 0.41 cm^2/m^2. Peak velocity ratio of LVOT to aortic valve: 0.19. Valve area (Vmax): 0.72 cm^2. Indexed valve area (Vmax): 0.42 cm^2/m^2. Mean velocity ratio of LVOT to aortic valve: 0.2. Valve area (Vmean): 0.75 cm^2. Indexed valve area (Vmean): 0.44 cm^2/m^2. Mean gradient (S): 17 mm Hg. Peak gradient (S): 32 mm Hg.  ------------------------------------------------------------------- Aorta:  Aortic root: The aortic root was normal in size.  ------------------------------------------------------------------- Mitral valve:   Calcified annulus. Mobility was not restricted. Doppler:  Transvalvular velocity was within the normal range. There was no evidence for stenosis. There was  mild regurgitation.    Peak gradient (D): 4 mm Hg.  ------------------------------------------------------------------- Left atrium:  The atrium was severely dilated.  ------------------------------------------------------------------- Atrial septum:  The septum bowed from right to left, consistent with increased right atrial pressure. There was an atrial septal aneurysm.  ------------------------------------------------------------------- Right ventricle:  The cavity size was moderately dilated. Systolic function was moderately reduced.  ------------------------------------------------------------------- Ventricular septum:   The contour showed diastolic flattening and systolic flattening.  ------------------------------------------------------------------- Pulmonic valve:    Doppler:  Transvalvular velocity was within the normal range. There was no evidence for stenosis. There was mild regurgitation.  ------------------------------------------------------------------- Tricuspid valve:   Structurally normal valve.    Doppler: Transvalvular velocity was within the normal range. There was wide-open regurgitation.  ------------------------------------------------------------------- Pulmonary artery:   Systolic pressure was increased.  ------------------------------------------------------------------- Right atrium:  The atrium was severely dilated.  ------------------------------------------------------------------- Pericardium:  A small pericardial effusion was identified.  ------------------------------------------------------------------- Systemic veins: Inferior vena cava: The vessel was dilated.    Assessment/Plan Present on Admission: Acute on chronic systolic heart failure, EF 15% -Admit to med telemetry -CHF order set initiated -Cardiology consulted from ER  . Hyperkalemia -Kayexalate ordered, 10 BMP at midnight  -No change in EKG  Acute on  chronic renal failure -Strict I's and O's, daily weight.  Chronic -Careful with IV Lasix  Elevated troponin -Secondary to renal failure.  GFR 34 -Doubt due to ischemic etiology -Single troponin to be repeated  Gastroenteritis -will check for the flu. No diarrhea since this AM, formed stool in ER  . Essential hypertension -Stable, home medications resumed with hold parameters  . PAD (peripheral artery disease) (HCC) -aware  Atrial fibrillation  -Stable, home medications resumed -No anticoagulation  dyslipidemia -Stable, home medications resume  General -Patient is receiving palliative in the past   Gwendolyne Welford 03/13/2017, 9:00 PM

## 2017-03-13 NOTE — ED Notes (Signed)
Pt c/o abd pain nausea is better

## 2017-03-13 NOTE — ED Notes (Signed)
Family at bedside. 

## 2017-03-13 NOTE — ED Provider Notes (Signed)
[facility] EMERGENCY DEPARTMENT Provider Note   CSN: [id number] Arrival date & time: 03/13/17  1541     History   Chief Complaint Chief Complaint  Patient presents with  . Shortness of Breath    HPI Jean Moore is a 84 y.o. female.   Shortness of Breath  This is a recurrent problem. The average episode lasts 2 days. The problem occurs continuously.The problem has been gradually worsening. Associated symptoms include leg swelling. Pertinent negatives include no fever, no headaches, no rhinorrhea, no sore throat, no ear pain, no cough, no wheezing, no chest pain, no vomiting, no abdominal pain and no rash. Treatments tried: home lasix. The treatment provided no relief. Associated medical issues include heart failure.    Past Medical History:  Diagnosis Date  . Aortic stenosis   . Atrial fibrillation (HCC)    on coumadin since 2012, recently stopped because of vaginal bleeding  . CHF (congestive heart failure) (HCC)   . Hyperlipidemia   . Hypertension   . Routine adult health maintenance 04/09/2012    Patient Active Problem List   Diagnosis Date Noted  . Left leg pain 01/14/2017  . Thrush, oral 01/11/2017  . PAD (peripheral artery disease) (HCC)   . Palliative care encounter   . Skin ulcers of both feet (HCC) 10/05/2016  . Sleep-wake cycle disorder 10/05/2016  . Urge incontinence 06/23/2016  . Toenail torn away 06/23/2016  . Hoarseness 05/19/2016  . Tinea pedis 05/05/2016  . Severe aortic stenosis 04/14/2016  . Encounter for end of life care 05/07/2015  . DNR (do not resuscitate) 05/07/2015  . Multinodular goiter 08/21/2014  . Stenosis of right internal carotid artery 05/07/2014  . Cataract 05/15/2013  . Paroxysmal atrial fibrillation (HCC) 04/09/2012  . Chronic systolic congestive heart failure (HCC) 04/09/2012  . Routine adult health maintenance 04/09/2012  . Essential hypertension 03/08/2007    Past Surgical History:  Procedure Laterality  Date  . ABDOMINAL AORTOGRAM W/LOWER EXTREMITY N/A 10/26/2016   Procedure: ABDOMINAL AORTOGRAM W/LOWER EXTREMITY;  Surgeon: Maeola Harman, MD;  Location: [facility] INVASIVE CV LAB;  Service: Cardiovascular;  Laterality: N/A;  . NO PAST SURGERIES    . PERIPHERAL VASCULAR BALLOON ANGIOPLASTY Left 11/14/2016   Procedure: PERIPHERAL VASCULAR BALLOON ANGIOPLASTY;  Surgeon: Maeola Harman, MD;  Location: [facility] INVASIVE CV LAB;  Service: Cardiovascular;  Laterality: Left;  Posterior tibial    OB History    No data available       Home Medications    Prior to Admission medications   Medication Sig Start Date End Date Taking? Authorizing Provider  acetaminophen (TYLENOL) 500 MG tablet Take 500 mg by mouth every 6 (six) hours as needed for pain.    [provider]  aspirin 81 MG tablet Take 1 tablet (81 mg total) by mouth daily. 05/06/13   Clegg, Amy D, NP  atorvastatin (LIPITOR) 40 MG tablet TAKE 1 TABLET BY MOUTH DAILY AT 6 PM. Patient taking differently: TAKE 1 TABLET BY MOUTH DAILY 11/14/16   Bensimhon, Bevelyn Buckles, MD  clopidogrel (PLAVIX) 75 MG tablet TAKE 1 TABLET BY MOUTH ONCE DAILY 03/05/17   Maeola Harman, MD  COMBIGAN 0.2-0.5 % ophthalmic solution Place 1 drop into both eyes 2 (two) times daily. 09/20/16   [provider]  diclofenac sodium (VOLTAREN) 1 % GEL Apply 2 g topically 4 (four) times daily. 01/11/17   Nyra Market, MD  digoxin (LANOXIN) 0.125 MG tablet Take 1 tablet (0.125 mg total) by mouth  daily. 04/14/16   Lyn Records, MD  dorzolamide (TRUSOPT) 2 % ophthalmic solution Place 1 drop into both eyes 2 (two) times daily. 09/22/16   [provider]  furosemide (LASIX) 40 MG tablet Take 2 tablets (80 mg total) 2 (two) times daily by mouth. 12/15/16   Ginger Carne, MD  latanoprost (XALATAN) 0.005 % ophthalmic solution Place 1 drop into both eyes at bedtime. 09/20/16   [provider]  Menthol (ICY HOT BACK) 5 % PTCH Apply 1 patch  topically as needed (pain).    [provider]  metoprolol succinate (TOPROL-XL) 50 MG 24 hr tablet Take 1 tablet (50 mg total) by mouth daily. Take with or immediately following a meal. 04/10/15   Selina Cooley, MD  Multiple Vitamins-Minerals (CENTRUM SILVER 50+WOMEN PO) Take 1 tablet daily by mouth.    [provider]  nystatin (MYCOSTATIN) 100000 UNIT/ML suspension Take 5 mLs (500,000 Units total) by mouth 4 (four) times daily. 01/11/17   Nyra Market, MD  Polyethyl Glycol-Propyl Glycol (SYSTANE ULTRA OP) Place 1 drop into both eyes 4 (four) times daily as needed (dry eyes).    [provider]  potassium chloride SA (K-DUR,KLOR-CON) 20 MEQ tablet TAKE 1 TABLET BY MOUTH 2 TIMES DAILY. 02/02/17   Nyra Market, MD    Family History Family History  Problem Relation Age of Onset  . Hypertension Mother   . Hypertension Father   . Thyroid disease Neg Hx     Social History Social History   Tobacco Use  . Smoking status: Never Smoker  . Smokeless tobacco: Never Used  Substance Use Topics  . Alcohol use: No    Alcohol/week: 0.0 oz  . Drug use: No     Allergies   Shrimp [shellfish allergy]   Review of Systems Review of Systems  Constitutional: Negative for chills and fever.  HENT: Negative for ear pain, rhinorrhea and sore throat.   Eyes: Negative for pain and visual disturbance.  Respiratory: Positive for shortness of breath. Negative for cough and wheezing.   Cardiovascular: Positive for leg swelling. Negative for chest pain and palpitations.  Gastrointestinal: Negative for abdominal pain and vomiting.  Genitourinary: Negative for dysuria and hematuria.  Musculoskeletal: Negative for arthralgias and back pain.  Skin: Negative for color change and rash.  Neurological: Negative for seizures, syncope and headaches.  All other systems reviewed and are negative.    Physical Exam Updated Vital Signs BP 92/80   Pulse 81   Temp (!) 97.5 F (36.4 C)  (Oral)   Resp (!) 22   Ht 5\' 3"  (1.6 m)   Wt 62.1 kg (137 lb)   LMP 10/12/1978   SpO2 94%   BMI 24.27 kg/m   Physical Exam  Constitutional: She is oriented to person, place, and time. She appears well-developed and well-nourished. No distress.  HENT:  Head: Normocephalic and atraumatic.  Eyes: Conjunctivae and EOM are normal. Pupils are equal, round, and reactive to light.  Neck: Neck supple.  Cardiovascular: Normal rate.  Borderline tachycardic, irregular  Pulmonary/Chest: Effort normal. No respiratory distress. She has rales (scattered).  Abdominal: Soft. There is no tenderness.  Musculoskeletal: She exhibits no edema.  Neurological: She is alert and oriented to person, place, and time.  Skin: Skin is warm and dry.  Psychiatric: She has a normal mood and affect.  Nursing note and vitals reviewed.    ED Treatments / Results  Labs (all labs ordered are listed, but only abnormal results are displayed) Labs  Reviewed  COMPREHENSIVE METABOLIC PANEL  CBC WITH DIFFERENTIAL/PLATELET  TROPONIN I  BRAIN NATRIURETIC PEPTIDE  DIGOXIN LEVEL    EKG  EKG Interpretation  Date/Time:  Monday March 13 2017 15:48:44 EST Ventricular Rate:  101 PR Interval:    QRS Duration: 146 QT Interval:  365 QTC Calculation: 474 R Axis:   -112 Text Interpretation:  Atrial fibrillation Right bundle branch block Anterior injury pattern ST-t wave abnormality Abnormal ekg Confirmed by Gerhard Munch [phone]) on 03/13/2017 3:51:58 PM       Radiology No results found.  Procedures Procedures (including critical care time)  Medications Ordered in ED Medications - No data to display   Initial Impression / Assessment and Plan / ED Course  I have reviewed the triage vital signs and the nursing notes.  Pertinent labs & imaging results that were available during my care of the patient were reviewed by me and considered in my medical decision making (see chart for details).    Ms. Bongiovanni is an  84 year old female with past medical history significant for aortic stenosis, A. fib, CHF, hyperlipidemia, hypertension who presents for 2 days of worsening shortness of breath.  Prior to the patient's arrival, EMS transmitted EKG that was concerning for STEMI.  Code STEMI initiated, but canceled by cardiology prior to patient's arrival after old EKGs were obtained.  Patient has new oxygen requirement.  Significant bilateral leg swelling.  Chest x-ray obtained, personally reviewed by me, demonstrates left basilar changes consistent with effusion.  Labs obtained and significant for elevated troponin consistent with patient's baseline, significantly elevated BNP, mild leukocytosis, elevated potassium.  Digoxin is 2.2.  Patient is a given Lasix for CHF exacerbation and hyperkalemia.  EKG obtained, personally reviewed by me, demonstrates A. fib, right bundle branch block, ST-T changes. No significant t-wave peaking.  Cardiology consulted and recommends medicine admission.  No immediate intervention at this time.  Patient is admitted to hospitalist.  Final Clinical Impressions(s) / ED Diagnoses   Final diagnoses:  Hypoxia  Congestive heart failure, unspecified HF chronicity, unspecified heart failure type [facility])  Shortness of breath    ED Discharge Orders    None       Garey Ham, MD 03/14/17 Eustace Quail    Gerhard Munch, MD 03/15/17 [phone]

## 2017-03-13 NOTE — ED Notes (Signed)
Pure Wick in place.  Pt's family at bedside

## 2017-03-13 NOTE — ED Triage Notes (Signed)
Pt to ED via [facility] with c/o shortness of breath x's 2 days,.  Pt was recently dx with CHF and started on Lasix.  Family reported to EMS that pt has continued to be short of breath with increased swelling in legs

## 2017-03-13 NOTE — ED Notes (Signed)
Pt c/o nausea/ vomiting

## 2017-03-13 NOTE — ED Notes (Signed)
Pt turned on side per pt's request.  Feet propped on pillow.  Pt resting at this time.  Pt continues to deny chest pain.

## 2017-03-13 NOTE — ED Notes (Signed)
Admitting doctor at the bedside 

## 2017-03-13 NOTE — ED Notes (Signed)
Bladder scanned  found

## 2017-03-13 NOTE — ED Notes (Signed)
Troponin results given to Dr. Lockwood 

## 2017-03-13 NOTE — ED Notes (Signed)
The pt has been moved to a regular hosp bed for comfort 

## 2017-03-13 NOTE — ED Notes (Signed)
Food given per doctor

## 2017-03-13 NOTE — ED Notes (Signed)
Pt incontinent of stool.  Pt cleaned and comfort measures given

## 2017-03-14 ENCOUNTER — Encounter (HOSPITAL_COMMUNITY): Payer: Self-pay | Admitting: General Practice

## 2017-03-14 ENCOUNTER — Other Ambulatory Visit: Payer: Self-pay

## 2017-03-14 LAB — CBC WITH DIFFERENTIAL/PLATELET
BASOS ABS: 0 10*3/uL (ref 0.0–0.1)
Basophils Relative: 0 %
EOS PCT: 0 %
Eosinophils Absolute: 0 10*3/uL (ref 0.0–0.7)
HEMATOCRIT: 35.2 % — AB (ref 36.0–46.0)
Hemoglobin: 11.2 g/dL — ABNORMAL LOW (ref 12.0–15.0)
LYMPHS PCT: 10 %
Lymphs Abs: 1.5 10*3/uL (ref 0.7–4.0)
MCH: 27.4 pg (ref 26.0–34.0)
MCHC: 31.8 g/dL (ref 30.0–36.0)
MCV: 86.1 fL (ref 78.0–100.0)
MONO ABS: 0.8 10*3/uL (ref 0.1–1.0)
MONOS PCT: 5 %
NEUTROS ABS: 13.2 10*3/uL — AB (ref 1.7–7.7)
Neutrophils Relative %: 85 %
PLATELETS: 121 10*3/uL — AB (ref 150–400)
RBC: 4.09 MIL/uL (ref 3.87–5.11)
RDW: 18.4 % — AB (ref 11.5–15.5)
WBC: 15.5 10*3/uL — ABNORMAL HIGH (ref 4.0–10.5)

## 2017-03-14 LAB — CREATININE, SERUM
CREATININE: 1.84 mg/dL — AB (ref 0.44–1.00)
GFR calc non Af Amer: 23 mL/min — ABNORMAL LOW (ref >60)
GFR, EST AFRICAN AMERICAN: 27 mL/min — AB (ref >60)

## 2017-03-14 LAB — CBC
HCT: 35.5 % — ABNORMAL LOW (ref 36.0–46.0)
Hemoglobin: 11.2 g/dL — ABNORMAL LOW (ref 12.0–15.0)
MCH: 27.2 pg (ref 26.0–34.0)
MCHC: 31.5 g/dL (ref 30.0–36.0)
MCV: 86.2 fL (ref 78.0–100.0)
PLATELETS: 133 10*3/uL — AB (ref 150–400)
RBC: 4.12 MIL/uL (ref 3.87–5.11)
RDW: 18.4 % — AB (ref 11.5–15.5)
WBC: 15.9 10*3/uL — AB (ref 4.0–10.5)

## 2017-03-14 LAB — BASIC METABOLIC PANEL
ANION GAP: 15 (ref 5–15)
BUN: 41 mg/dL — AB (ref 6–20)
CALCIUM: 9 mg/dL (ref 8.9–10.3)
CO2: 23 mmol/L (ref 22–32)
Chloride: 98 mmol/L — ABNORMAL LOW (ref 101–111)
Creatinine, Ser: 1.85 mg/dL — ABNORMAL HIGH (ref 0.44–1.00)
GFR calc Af Amer: 27 mL/min — ABNORMAL LOW (ref >60)
GFR, EST NON AFRICAN AMERICAN: 23 mL/min — AB (ref >60)
GLUCOSE: 215 mg/dL — AB (ref 65–99)
Potassium: 5.7 mmol/L — ABNORMAL HIGH (ref 3.5–5.1)
Sodium: 136 mmol/L (ref 135–145)

## 2017-03-14 LAB — MAGNESIUM: Magnesium: 2.3 mg/dL (ref 1.7–2.4)

## 2017-03-14 LAB — DIGOXIN LEVEL: DIGOXIN LVL: 2.1 ng/mL — AB (ref 0.8–2.0)

## 2017-03-14 LAB — INFLUENZA PANEL BY PCR (TYPE A & B)
Influenza A By PCR: NEGATIVE
Influenza B By PCR: NEGATIVE

## 2017-03-14 LAB — TROPONIN I: Troponin I: 0.21 ng/mL (ref <0.03)

## 2017-03-14 LAB — BRAIN NATRIURETIC PEPTIDE: B Natriuretic Peptide: >4500 pg/mL — ABNORMAL HIGH (ref 0.0–100.0)

## 2017-03-14 MED ORDER — BRIMONIDINE TARTRATE 0.2 % OP SOLN
1.0000 [drp] | Freq: Two times a day (BID) | OPHTHALMIC | Status: DC
  Administered 2017-03-14 – 2017-03-17 (×7): 1 [drp] via OPHTHALMIC
  Filled 2017-03-14: qty 5

## 2017-03-14 MED ORDER — ENSURE ENLIVE PO LIQD
237.0000 mL | Freq: Two times a day (BID) | ORAL | Status: DC
  Administered 2017-03-14 – 2017-03-17 (×7): 237 mL via ORAL

## 2017-03-14 MED ORDER — TIMOLOL MALEATE 0.5 % OP SOLN
1.0000 [drp] | Freq: Two times a day (BID) | OPHTHALMIC | Status: DC
  Administered 2017-03-14 – 2017-03-17 (×6): 1 [drp] via OPHTHALMIC
  Filled 2017-03-14 (×2): qty 5

## 2017-03-14 MED ORDER — DEXTROSE 5 % IV SOLN
1.0000 g | INTRAVENOUS | Status: DC
  Administered 2017-03-14 – 2017-03-17 (×4): 1 g via INTRAVENOUS
  Filled 2017-03-14 (×4): qty 10

## 2017-03-14 MED ORDER — SODIUM POLYSTYRENE SULFONATE 15 GM/60ML PO SUSP
30.0000 g | Freq: Once | ORAL | Status: AC
  Administered 2017-03-14: 30 g via ORAL
  Filled 2017-03-14: qty 120

## 2017-03-14 MED ORDER — LEVOFLOXACIN 500 MG PO TABS: 500.0000 mg | ORAL_TABLET | Freq: Every day | ORAL | Status: DC

## 2017-03-14 MED FILL — OFLOXACIN 0.3% EYE DROPS: 0.3 | 24 days supply | Qty: 10 | Fill #0

## 2017-03-14 MED FILL — BRIMONIDINE 0.2% EYE DROP: 0.2 | 90 days supply | Qty: 10 | Fill #0

## 2017-03-14 MED FILL — PREDNISOLONE AC 1% EYE DROP: 1 | 25 days supply | Qty: 5 | Fill #0

## 2017-03-14 MED FILL — KETOROLAC 0.5% OPHTH SOLN: 0.5 | 25 days supply | Qty: 5 | Fill #0

## 2017-03-14 MED FILL — CYCLOGYL 1% EYE DROPS: 1 | 30 days supply | Qty: 5 | Fill #0

## 2017-03-14 NOTE — Progress Notes (Signed)
PROGRESS NOTE    Patient: Jean Moore     PCP: Nyra Market, MD                    DOB: May 06, 1926            DOA: 03/13/2017 ZOX:[id number]             DOS: 03/14/2017, 11:45 AM   Date of Service: the patient was seen and examined on 03/14/2017 Subjective:    Patient was seen and examined this morning, stable, no acute distress.  Still complaining shortness of breath but much improved.  Denies any chest pain.  ----------------------------------------------------------------------------------------------------------------------  Brief Narrative:   [name] is a 84 year old female with multiple comorbidities including severe chronic systolic congestive heart failure with ejection fraction of 50% presented with shortness of breath, lower extremity worsening edema.  H/o chronic hypotension, severe AS, afib has refused coumadin. From notes patient has been resistant to medication changes, has refused ICD, and refused prior invasive procedures and thus valve intervention was not pursued.    Active Problems:   Essential hypertension   Acute on chronic systolic CHF (congestive heart failure) (HCC)   Multinodular goiter   Severe aortic stenosis   PAD (peripheral artery disease) (HCC)   Hyperkalemia   Acute on chronic renal failure (HCC)   Atrial fibrillation, chronic (HCC)   Dyslipidemia   Assessment & Plan:  Acute on chronic systolic heart failure, EF 15% We will continue to monitor patient closely, daily weights, strict I's and O's,  continue IV Lasix, reduce the dose from 80-40 mg IV twice daily -CHF order set -Cardiology consulted, following.  H/p Severe AS-  From notes patient has been resistant to medication changes, has refused ICD, and refused prior invasive procedures and thus valve intervention   . Hyperkalemia -Kayexalate  will be reordered, Potassium 6.2, 6.4, now 5.7 -No change in EKG  Acute on chronic renal failure -Strict I's and O's, daily weight.   Chronic Elevated BUN/creatinine from the baseline, will reduce IV Lasix dosage -Careful with IV Lasix  Elevated troponin -Secondary to renal failure.  GFR 34 -Doubt due to ischemic etiology -Single troponin to be repeated  Gastroenteritis -will check for the flu. No diarrhea since this AM, formed stool in ER  . Essential hypertension -Stable, home medications resumed with hold parameters  . PAD (peripheral artery disease) (HCC) -aware  Atrial fibrillation  -Stable, home medications resumed -No anticoagulation, she has refused Coumadin in the past.  dyslipidemia -Stable, home medications resume  General -Patient is receiving palliative in the past  Leukocytosis -afebrile, improved blood pressure,  chest x-ray revealed left basilar changes, infiltrate, will initiate IV antibiotics of Rocephin for now.    DVT prophylaxis:   SCDs/compression stockings         Code Status:         DNR/DNI  Family Communication:  The above findings and plan of care has been discussed with patient and family in detail, they expressed understanding and agreement of above.   Disposition Plan: > 3 days             Home with home health            Consultants:   Cardiology  Procedures:  No admission procedures for hospital encounter.   Antimicrobials:  Anti-infectives (From admission, onward)   Start     Dose/Rate Route Frequency Ordered Stop   03/14/17 1000  levofloxacin (LEVAQUIN) tablet 500 mg  Status:  Discontinued     500 mg Oral Daily 03/14/17 0726 03/14/17 0731   03/14/17 0815  cefTRIAXone (ROCEPHIN) 1 g in dextrose 5 % 50 mL IVPB     1 g 100 mL/hr over 30 Minutes Intravenous Every 24 hours 03/14/17 0732 03/19/17 0814        Objective: Vitals:   03/14/17 0445 03/14/17 0628 03/14/17 0744 03/14/17 1009  BP: 106/72 103/71 (!) 94/56 105/72  Pulse:  77 74 88  Resp: 20 18 18    Temp:  98.5 F (36.9 C) 98 F (36.7 C)   TempSrc:  Oral Oral   SpO2: 94% 96% 96% 96%    Weight:  66 kg (145 lb 8.1 oz)    Height:  5\' 3"  (1.6 m)      Intake/Output Summary (Last 24 hours) at 03/14/2017 1145 Last data filed at 03/14/2017 0916 Gross per 24 hour  Intake 230 ml  Output -  Net 230 ml   Filed Weights   03/13/17 1557 03/14/17 0628  Weight: 62.1 kg (137 lb) 66 kg (145 lb 8.1 oz)    Examination:  General exam: Appears calm and comfortable   Respiratory system: Clear to auscultation. Respiratory effort normal. Cardiovascular system: S1 & S2 heard, RRR. No JVD, ++ systolic murmurs, rubs, gallops or clicks. No pedal edema. Gastrointestinal system: Abdomen is nondistended, soft and nontender. No organomegaly or masses felt. Normal bowel sounds heard. Central nervous system: Alert and oriented. No focal neurological deficits. Extremities: Symmetric 5 x 5 power, +2 edema  Skin: No rashes, lesions or ulcers Psychiatry: Judgement and insight appear normal. Mood & affect appropriate.     Data Reviewed: I have personally reviewed following labs and imaging studies  CBC: Recent Labs  Lab 03/13/17 1550 03/14/17 0619  WBC 14.5* 15.9*  15.5*  NEUTROABS 12.7* 13.2*  HGB 13.2 11.2*  11.2*  HCT 41.7 35.5*  35.2*  MCV 86.3 86.2  86.1  PLT 136* 133*  121*   Basic Metabolic Panel: Recent Labs  Lab 03/13/17 1550 03/13/17 2210 03/14/17 0619  NA 134* 135 136  K 6.2* 6.4* 5.7*  CL 98* 98* 98*  CO2 24 21* 23  GLUCOSE 196* 146* 215*  BUN 35* 38* 41*  CREATININE 1.36* 1.56* 1.85*  1.84*  CALCIUM 9.0 9.2 9.0  MG  --   --  2.3   GFR: Estimated Creatinine Clearance: 19.3 mL/min (A) (by C-G formula based on SCr of 1.84 mg/dL (H)). Liver Function Tests: Recent Labs  Lab 03/13/17 1550  AST 54*  ALT 40  ALKPHOS 102  BILITOT 1.5*  PROT 7.7  ALBUMIN 2.8*   No results for input(s): LIPASE, AMYLASE in the last 168 hours. No results for input(s): AMMONIA in the last 168 hours. Coagulation Profile: No results for input(s): INR, PROTIME in the last 168  hours. Cardiac Enzymes: Recent Labs  Lab 03/13/17 1550 03/13/17 1600 03/14/17 0619  TROPONINI 0.11* 0.11* 0.21*   BNP (last 3 results) No results for input(s): PROBNP in the last 8760 hours. HbA1C: No results for input(s): HGBA1C in the last 72 hours. CBG: No results for input(s): GLUCAP in the last 168 hours. Lipid Profile: No results for input(s): CHOL, HDL, LDLCALC, TRIG, CHOLHDL, LDLDIRECT in the last 72 hours. Thyroid Function Tests: No results for input(s): TSH, T4TOTAL, FREET4, T3FREE, THYROIDAB in the last 72 hours. Anemia Panel: No results for input(s): VITAMINB12, FOLATE, FERRITIN, TIBC, IRON, RETICCTPCT in the last 72 hours. Sepsis Labs: No results for input(s): PROCALCITON, LATICACIDVEN  in the last 168 hours.  No results found for this or any previous visit (from the past 240 hour(s)).     Radiology Studies: Dg Chest Portable 1 View  Result Date: 03/13/2017 CLINICAL DATA:  Shortness of breath for several days EXAM: PORTABLE CHEST 1 VIEW COMPARISON:  12/28/2016 FINDINGS: Cardiac shadow is enlarged. Aortic calcifications are again seen. The lungs are well aerated bilaterally. New left basilar infiltrate with associated effusion is noted. No acute bony abnormality is seen. IMPRESSION: Left basilar changes consistent with effusion and underlying infiltrate. Electronically Signed   By: Alcide Clever M.D.   On: 03/13/2017 16:16    Scheduled Meds: . aspirin EC  81 mg Oral Daily  . atorvastatin  40 mg Oral q1800  . brimonidine  1 drop Both Eyes BID   And  . timolol  1 drop Both Eyes BID  . clopidogrel  75 mg Oral Daily  . digoxin  0.125 mg Oral Daily  . dorzolamide  1 drop Both Eyes BID  . feeding supplement (ENSURE ENLIVE)  237 mL Oral BID BM  . furosemide  40 mg Intravenous Q12H  . heparin  5,000 Units Subcutaneous Q12H  . latanoprost  1 drop Both Eyes QHS  . metoprolol succinate  50 mg Oral Daily  . sodium chloride flush  3 mL Intravenous Q12H   Continuous  Infusions: . sodium chloride    . cefTRIAXone (ROCEPHIN)  IV 1 g (03/14/17 0956)     LOS: 1 day    Time spent: >25 minutes   Kendell Bane, MD [facility] Pager [phone]  If 7PM-7AM, please contact night-coverage [email or web address] Password [email or web address] 03/14/2017, 11:45 AM

## 2017-03-14 NOTE — Progress Notes (Signed)
PT Cancellation Note  Patient Details Name: Jean Moore MRN: [id number] DOB: 05/24/1926   Cancelled Treatment:    Reason Eval/Treat Not Completed: Medical issues which prohibited therapy Noted patient with K+ 5.7 (outside of [facility] guidelines for PT), as well as up-trending troponins (0.11 --> 0.21). Patient does not appear medically ready to participate in PT evaluation today, plan to resume when patient medically appropriate.    Nedra Hai PT, DPT, CBIS  Supplemental Physical Therapist [facility]   Pager [phone]

## 2017-03-14 NOTE — Progress Notes (Signed)
Initial Nutrition Assessment  DOCUMENTATION CODES:   Not applicable  INTERVENTION:   -Ensure Enlive po TID, each supplement provides 350 kcal and 20 grams of protein  NUTRITION DIAGNOSIS:   Inadequate oral intake related to decreased appetite as evidenced by per patient/family report, meal completion < 50%.  GOAL:   Patient will meet greater than or equal to 90% of their needs  MONITOR:   PO intake, Supplement acceptance, Labs, Weight trends, Skin, I & O's  REASON FOR ASSESSMENT:   Malnutrition Screening Tool    ASSESSMENT:   Jean Moore is a 84 y.o. female with a hx of chronic systolic HF who is being seen today for the evaluation of acute on chronic systolic CHF at the request of Dr August Saucer   Pt admitted with CHF.   Spoke with pt son at bedside. He requested this RD not wake or examine pt at this time, as pt had just fallen asleep. He was able to provide basic hx for pt; he reveals that his brother (who usually arrives in the afternoon) is much more knowledgeable about pt. He reports that pt has had an ongoing poor appetite; she consumes 3 meals per day, but often only eats bites and sips at meals. He is unsure if pt had eaten breakfast; noted meal completion 25-75%. Pt son also reports that pt has had difficulty consuming enough fluids.   Pt son denies that pt has lost weight recently. Noted some wt fluctuations within the past year, likely due to fluid changes from CHF.   Pt appears that she may have some fat and muscle depletion; however, unable to assess fully at this time. Visit was ended prematurely due to MD need to assess pt.  Labs reviewed: K: 5.7  Diet Order:  Diet Heart Room service appropriate? Yes; Fluid consistency: Thin  EDUCATION NEEDS:   No education needs have been identified at this time  Skin:  Skin Assessment: Skin Integrity Issues: Skin Integrity Issues:: Diabetic Ulcer Diabetic Ulcer: rt foot, lt foot  Last BM:  03/14/17  Height:   Ht  Readings from Last 1 Encounters:  03/14/17 5\' 3"  (1.6 m)    Weight:   Wt Readings from Last 1 Encounters:  03/14/17 145 lb 8.1 oz (66 kg)    Ideal Body Weight:  52.3 kg  BMI:  Body mass index is 25.77 kg/m.  Estimated Nutritional Needs:   Kcal:  1650-1850  Protein:  70-85 grams  Fluid:  1.6-1.8 L    Shanina Kepple A. Mayford Knife, RD, LDN, CDE Pager: [phone] After hours Pager: [phone]

## 2017-03-14 NOTE — ED Notes (Signed)
Got pt all cleaned up, complete linen change.  Pure wick applied

## 2017-03-14 NOTE — Progress Notes (Signed)
   03/14/17 0628  Vitals  Temp 98.5 F (36.9 C)  Temp Source Oral  BP 103/71  BP Location Right Arm  Pulse Rate 77  Pulse Rate Source Dinamap  Resp 18  Oxygen Therapy  SpO2 96 %  O2 Device Room Air  Height and Weight  Height 5\' 3"  (1.6 m)  Weight 66 kg (145 lb 8.1 oz)  Type of Scale Used Bed  BSA (Calculated - sq m) 1.71 sq meters  BMI (Calculated) 25.78  Weight in (lb) to have BMI = 25 140.8  Admitted pt to room 3E25 from ED via bed, pt alert and oriented, denied pain at this time, oriented to room, call bell placed within reach, placed on cardiac monitor, CCMD made aware. Initial assessment done, pt had another BM, pt received kayexalate in ED. Patient was cleaned and purewick applied.

## 2017-03-15 DIAGNOSIS — E875 Hyperkalemia: Secondary | ICD-10-CM

## 2017-03-15 DIAGNOSIS — N179 Acute kidney failure, unspecified: Secondary | ICD-10-CM

## 2017-03-15 DIAGNOSIS — I509 Heart failure, unspecified: Secondary | ICD-10-CM

## 2017-03-15 DIAGNOSIS — I739 Peripheral vascular disease, unspecified: Secondary | ICD-10-CM

## 2017-03-15 DIAGNOSIS — I10 Essential (primary) hypertension: Secondary | ICD-10-CM

## 2017-03-15 DIAGNOSIS — I350 Nonrheumatic aortic (valve) stenosis: Secondary | ICD-10-CM

## 2017-03-15 DIAGNOSIS — R0602 Shortness of breath: Secondary | ICD-10-CM

## 2017-03-15 DIAGNOSIS — E042 Nontoxic multinodular goiter: Secondary | ICD-10-CM

## 2017-03-15 DIAGNOSIS — E785 Hyperlipidemia, unspecified: Secondary | ICD-10-CM

## 2017-03-15 DIAGNOSIS — N184 Chronic kidney disease, stage 4 (severe): Secondary | ICD-10-CM

## 2017-03-15 LAB — CBC
HCT: 35 % — ABNORMAL LOW (ref 36.0–46.0)
Hemoglobin: 11 g/dL — ABNORMAL LOW (ref 12.0–15.0)
MCH: 26.8 pg (ref 26.0–34.0)
MCHC: 31.4 g/dL (ref 30.0–36.0)
MCV: 85.4 fL (ref 78.0–100.0)
PLATELETS: 112 10*3/uL — AB (ref 150–400)
RBC: 4.1 MIL/uL (ref 3.87–5.11)
RDW: 18.5 % — AB (ref 11.5–15.5)
WBC: 9.2 10*3/uL (ref 4.0–10.5)

## 2017-03-15 LAB — BASIC METABOLIC PANEL
Anion gap: 13 (ref 5–15)
BUN: 47 mg/dL — AB (ref 6–20)
CO2: 24 mmol/L (ref 22–32)
CREATININE: 1.68 mg/dL — AB (ref 0.44–1.00)
Calcium: 8.5 mg/dL — ABNORMAL LOW (ref 8.9–10.3)
Chloride: 96 mmol/L — ABNORMAL LOW (ref 101–111)
GFR calc Af Amer: 30 mL/min — ABNORMAL LOW (ref >60)
GFR, EST NON AFRICAN AMERICAN: 26 mL/min — AB (ref >60)
GLUCOSE: 273 mg/dL — AB (ref 65–99)
Potassium: 4.2 mmol/L (ref 3.5–5.1)
Sodium: 133 mmol/L — ABNORMAL LOW (ref 135–145)

## 2017-03-15 LAB — BRAIN NATRIURETIC PEPTIDE: B Natriuretic Peptide: 3924.9 pg/mL — ABNORMAL HIGH (ref 0.0–100.0)

## 2017-03-15 NOTE — Evaluation (Signed)
Physical Therapy Evaluation Patient Details Name: Jean Moore MRN: [id number] DOB: Jun 22, 1926 Today's Date: 03/15/2017   History of Present Illness  Pt is an 84 y.o. female admitted 03/13/17 with c/o SOB and BLE edema; worked up for acute on chronic HF. Elevated troponin likely secondary to renal failure. PMH includes a-fib, CHF, HTN, PAD.     Clinical Impression  Pt presents with an overall decrease in functional mobility secondary to above. PTA, pt lives at home with son; requires assist for amb short distance with RW, and requires assist for ADLs. Today, pt required min-modA to stand and transfer to chair with RW; incontinent of bowel during session and dependent for pericare. Pt with decreased insight into deficits and amount of assist needed. Pt would benefit from continued acute PT services to maximize functional mobility and independence prior to d/c with SNF-level therapies.       Follow Up Recommendations SNF;Supervision/Assistance - 24 hour    Equipment Recommendations  None recommended by PT    Recommendations for Other Services       Precautions / Restrictions Precautions Precautions: Fall Restrictions Weight Bearing Restrictions: No      Mobility  Bed Mobility Overal bed mobility: Needs Assistance Bed Mobility: Supine to Sit     Supine to sit: Min assist     General bed mobility comments: MinA to assist trunk elevation with single UE support; significant time and effort initiating bed mobility, requiring multimodal cues for sequencing. Noted pt incontinent of bowel upon sitting  Transfers Overall transfer level: Needs assistance Equipment used: Rolling walker (2 wheeled) Transfers: Sit to/from [employer] Sit to Stand: Min assist;Mod assist         General transfer comment: Pt standing 3x from bed with RW for pericare; dependent for pericare. Min-modA to steady RW and assist trunk elevation; pt very unsafe, requiring multimodal cues for RW  proximity and hand placement. Taking steps, but turning into a stand pivot transfer secondary to pt fatigue and decreased safety awareness. Pt incontinent of bowel upon standing  Ambulation/Gait                Stairs            Wheelchair Mobility    Modified Rankin (Stroke Patients Only)       Balance Overall balance assessment: Needs assistance   Sitting balance-Leahy Scale: Fair       Standing balance-Leahy Scale: Poor Standing balance comment: Significant forward flexed posture with posterior lean while standing with BUE support on RW                             Pertinent Vitals/Pain Pain Assessment: Faces Faces Pain Scale: Hurts a little bit Pain Location: L foot Pain Descriptors / Indicators: Aching Pain Intervention(s): Monitored during session    Home Living Family/patient expects to be discharged to:: Private residence Living Arrangements: Children(Son) Available Help at Discharge: Family;Available 24 hours/day Type of Home: House Home Access: Stairs to enter Entrance Stairs-Rails: Right;Left;Can reach both Entrance Stairs-Number of Steps: 6 Home Layout: One level Home Equipment: Walker - 2 wheels;Wheelchair - manual;Bedside commode;Cane - single point Additional Comments: Pt willing to consider SNF    Prior Function Level of Independence: Needs assistance   Gait / Transfers Assistance Needed: Per pt and daughter, pt able to amb household distances with RW and intermittent assist.  ADL's / Homemaking Assistance Needed: Pt requires assist for ADLs  Hand Dominance        Extremity/Trunk Assessment   Upper Extremity Assessment Upper Extremity Assessment: Generalized weakness    Lower Extremity Assessment Lower Extremity Assessment: Generalized weakness       Communication   Communication: HOH  Cognition Arousal/Alertness: Awake/alert Behavior During Therapy: Flat affect Overall Cognitive Status:  Impaired/Different from baseline Area of Impairment: Following commands;Safety/judgement;Awareness;Problem solving                       Following Commands: Follows one step commands with increased time Safety/Judgement: Decreased awareness of safety;Decreased awareness of deficits Awareness: Emergent Problem Solving: Slow processing;Difficulty sequencing;Requires verbal cues;Requires tactile cues;Decreased initiation        General Comments General comments (skin integrity, edema, etc.): Daughter present throughout session and very supportive/willing to assist if needed    Exercises     Assessment/Plan    PT Assessment Patient needs continued PT services  PT Problem List Decreased strength;Decreased range of motion;Decreased activity tolerance;Decreased balance;Decreased mobility;Decreased cognition;Decreased knowledge of use of DME;Decreased safety awareness;Decreased skin integrity       PT Treatment Interventions DME instruction;Gait training;Stair training;Functional mobility training;Therapeutic activities;Therapeutic exercise;Balance training;Patient/family education    PT Goals (Current goals can be found in the Care Plan section)  Acute Rehab PT Goals Patient Stated Goal: Return home PT Goal Formulation: With patient Time For Goal Achievement: 03/29/17 Potential to Achieve Goals: Good    Frequency Min 2X/week   Barriers to discharge        Co-evaluation               AM-PAC PT "6 Clicks" Daily Activity  Outcome Measure Difficulty turning over in bed (including adjusting bedclothes, sheets and blankets)?: Unable Difficulty moving from lying on back to sitting on the side of the bed? : Unable Difficulty sitting down on and standing up from a chair with arms (e.g., wheelchair, bedside commode, etc,.)?: Unable Help needed moving to and from a bed to chair (including a wheelchair)?: A Little Help needed walking in hospital room?: A Lot Help needed  climbing 3-5 steps with a railing? : A Lot 6 Click Score: 10    End of Session Equipment Utilized During Treatment: Gait belt Activity Tolerance: Patient limited by fatigue Patient left: in chair;with call bell/phone within reach;with family/visitor present Nurse Communication: Mobility status PT Visit Diagnosis: Other abnormalities of gait and mobility (R26.89);Muscle weakness (generalized) (M62.81)    Time: 1500-1530 PT Time Calculation (min) (ACUTE ONLY): 30 min   Charges:   PT Evaluation $PT Eval Moderate Complexity: 1 Mod PT Treatments $Therapeutic Activity: 8-22 mins   PT G Codes:       Ina Homes, PT, DPT Acute Rehab Services  Pager: [phone]  Malachy Chamber 03/15/2017, 3:46 PM

## 2017-03-15 NOTE — Progress Notes (Addendum)
PROGRESS NOTE    HILIANA EILTS  ZOX:[id number] DOB: 02-04-1927 DOA: 03/13/2017 PCP: Nyra Market, MD   Chief Complaint  Patient presents with  . Shortness of Breath    Brief Narrative:  HPI on 03/13/2017 by Dr. Gery Pray this is 84 y/o female who presents with c/o diarrhea since yesterday and again this AM. None in ER. In the ER she developed nausea and vomitng, improved with zofran. She also reports some SOB, mild. She denies chest pain. Over the past 2 weeks she's had increased edema on LE but family reports stable weights.she is weak, now mostly using the wheelchair due to increased respiratory effeot. There is no wheezing, no cough, no fevers, no chills. Her urine output remains unchanged. She has had no sick contacts.  History provided by the patient and her son who is present at bedside.  Interim history Admitted for CHF exac.  Assessment & Plan   Acute on chronic systolic heart failure -Patient presented with some shortness of breath as well as lower extremity edema -BNP on admission greater than 4500 -Echocardiogram 03/17/2016 showed an EF of 15%, diffuse hypokinesis -Patient has refused ICD in the past as well as invasive procedures -Continue digoxin -Continue IV Lasix 40 mg twice daily -Monitor intake and output, daily weights  History of severe aortic stenosis -Echocardiogram in February 2018 showed severe global reduction in LV function, mild LVH, calcified aortic valve with severe AS -Patient has refused invasive procedures in the past  Hyperkalemia -Resolved, patient was given Kayexalate.  Potassium currently 4.2 -Continue to monitor BMP  Acute kidney injury on chronic kidney disease, stage III -Likely secondary to diuresis, will continue to monitor BMP  Elevated troponin -Likely secondary to renal failure and demand -Troponin peaked at 0.21, was 0.11 prior -Currently denies chest pain  Gastroenteritis -No longer having diarrhea.  Patient had formed stool  in the emergency department -Influenza PCR negative  Essential hypertension -Continue metoprolol  PAD -Continue Plavix, statin, aspirin  Dyslipidemia  -Continue statin  Leukocytosis -Chest x-ray on admission showed left basilar changes consistent with effusion and underlying infiltrate -Patient was started on ceftriaxone -Leukocytosis resolved  Atrial fibrillation -Currently not on anticoagulation, refused in the past -Continue metoprolol and digoxin  DVT Prophylaxis heparin  Code Status: DNR  Family Communication: Son at bedside  Disposition Plan: Admitted.  Continue diuresis.  Suspect home on discharge  Consultants Cardiology  Procedures  none  Antibiotics   Anti-infectives (From admission, onward)   Start     Dose/Rate Route Frequency Ordered Stop   03/14/17 1000  levofloxacin (LEVAQUIN) tablet 500 mg  Status:  Discontinued     500 mg Oral Daily 03/14/17 0726 03/14/17 0731   03/14/17 0815  cefTRIAXone (ROCEPHIN) 1 g in dextrose 5 % 50 mL IVPB     1 g 100 mL/hr over 30 Minutes Intravenous Every 24 hours 03/14/17 0732 03/19/17 0814      Subjective:   Lurae Hornbrook seen and examined today.  Patient continues to have leg swelling.  Denies further diarrhea.  Feels her breathing has improved.  Continues to feel weak.  Denies chest pain, abdominal pain, nausea or vomiting, dizziness or headache.  Objective:   Vitals:   03/14/17 1641 03/14/17 1940 03/14/17 2300 03/15/17 0533  BP: 101/65 109/78 110/76 104/63  Pulse: (!) 105 100 90 73  Resp: 18 18 18 18   Temp:  97.9 F (36.6 C)  98.1 F (36.7 C)  TempSrc:  Oral  Oral  SpO2: 97% 95%  100% 98%  Weight:    65.4 kg (144 lb 2.9 oz)  Height:        Intake/Output Summary (Last 24 hours) at 03/15/2017 1039 Last data filed at 03/15/2017 0917 Gross per 24 hour  Intake 1007 ml  Output -  Net 1007 ml   Filed Weights   03/13/17 1557 03/14/17 0628 03/15/17 0533  Weight: 62.1 kg (137 lb) 66 kg (145 lb 8.1 oz) 65.4 kg  (144 lb 2.9 oz)    Exam  General: Well developed, elderly, no apparent distress  HEENT: NCAT, mucous membranes moist.   Cardiovascular: S1 S2 auscultated, irregular, 3/6SEM  Respiratory: Clear to auscultation bilaterally with equal chest rise  Abdomen: Soft, nontender, nondistended, + bowel sounds  Extremities: warm dry without cyanosis clubbing. 2+LE edema  Neuro: AAOx3, nonfocal  Psych: appropriate, pleasant   Data Reviewed: I have personally reviewed following labs and imaging studies  CBC: Recent Labs  Lab 03/13/17 1550 03/14/17 0619 03/15/17 0528  WBC 14.5* 15.9*  15.5* 9.2  NEUTROABS 12.7* 13.2*  --   HGB 13.2 11.2*  11.2* 11.0*  HCT 41.7 35.5*  35.2* 35.0*  MCV 86.3 86.2  86.1 85.4  PLT 136* 133*  121* 112*   Basic Metabolic Panel: Recent Labs  Lab 03/13/17 1550 03/13/17 2210 03/14/17 0619 03/15/17 0528  NA 134* 135 136 133*  K 6.2* 6.4* 5.7* 4.2  CL 98* 98* 98* 96*  CO2 24 21* 23 24  GLUCOSE 196* 146* 215* 273*  BUN 35* 38* 41* 47*  CREATININE 1.36* 1.56* 1.85*  1.84* 1.68*  CALCIUM 9.0 9.2 9.0 8.5*  MG  --   --  2.3  --    GFR: Estimated Creatinine Clearance: 21 mL/min (A) (by C-G formula based on SCr of 1.68 mg/dL (H)). Liver Function Tests: Recent Labs  Lab 03/13/17 1550  AST 54*  ALT 40  ALKPHOS 102  BILITOT 1.5*  PROT 7.7  ALBUMIN 2.8*   No results for input(s): LIPASE, AMYLASE in the last 168 hours. No results for input(s): AMMONIA in the last 168 hours. Coagulation Profile: No results for input(s): INR, PROTIME in the last 168 hours. Cardiac Enzymes: Recent Labs  Lab 03/13/17 1550 03/13/17 1600 03/14/17 0619  TROPONINI 0.11* 0.11* 0.21*   BNP (last 3 results) No results for input(s): PROBNP in the last 8760 hours. HbA1C: No results for input(s): HGBA1C in the last 72 hours. CBG: No results for input(s): GLUCAP in the last 168 hours. Lipid Profile: No results for input(s): CHOL, HDL, LDLCALC, TRIG, CHOLHDL,  LDLDIRECT in the last 72 hours. Thyroid Function Tests: No results for input(s): TSH, T4TOTAL, FREET4, T3FREE, THYROIDAB in the last 72 hours. Anemia Panel: No results for input(s): VITAMINB12, FOLATE, FERRITIN, TIBC, IRON, RETICCTPCT in the last 72 hours. Urine analysis:    Component Value Date/Time   COLORURINE AMBER (A) 04/14/2015 1856   APPEARANCEUR CLOUDY (A) 04/14/2015 1856   LABSPEC 1.026 04/14/2015 1856   PHURINE 7.5 04/14/2015 1856   GLUCOSEU NEGATIVE 04/14/2015 1856   HGBUR NEGATIVE 04/14/2015 1856   BILIRUBINUR SMALL (A) 04/14/2015 1856   KETONESUR NEGATIVE 04/14/2015 1856   PROTEINUR 100 (A) 04/14/2015 1856   NITRITE NEGATIVE 04/14/2015 1856   LEUKOCYTESUR SMALL (A) 04/14/2015 1856   Sepsis Labs: @LABRCNTIP (procalcitonin:4,lacticidven:4)  )No results found for this or any previous visit (from the past 240 hour(s)).    Radiology Studies: Dg Chest Portable 1 View  Result Date: 03/13/2017 CLINICAL DATA:  Shortness of breath for several days  EXAM: PORTABLE CHEST 1 VIEW COMPARISON:  12/28/2016 FINDINGS: Cardiac shadow is enlarged. Aortic calcifications are again seen. The lungs are well aerated bilaterally. New left basilar infiltrate with associated effusion is noted. No acute bony abnormality is seen. IMPRESSION: Left basilar changes consistent with effusion and underlying infiltrate. Electronically Signed   By: Alcide Clever M.D.   On: 03/13/2017 16:16     Scheduled Meds: . aspirin EC  81 mg Oral Daily  . atorvastatin  40 mg Oral q1800  . brimonidine  1 drop Both Eyes BID   And  . timolol  1 drop Both Eyes BID  . clopidogrel  75 mg Oral Daily  . digoxin  0.125 mg Oral Daily  . dorzolamide  1 drop Both Eyes BID  . feeding supplement (ENSURE ENLIVE)  237 mL Oral BID BM  . furosemide  40 mg Intravenous Q12H  . heparin  5,000 Units Subcutaneous Q12H  . latanoprost  1 drop Both Eyes QHS  . metoprolol succinate  50 mg Oral Daily  . sodium chloride flush  3 mL  Intravenous Q12H   Continuous Infusions: . sodium chloride    . cefTRIAXone (ROCEPHIN)  IV 1 g (03/15/17 0851)     LOS: 2 days   Time Spent in minutes   45 minutes (greater than 50% of this time was spent with the patient reviewing labs and counseling patient.)  Edsel Petrin D.O. on 03/15/2017 at 10:39 AM  Between 7am to 7pm - Pager - [phone]  After 7pm go to [email or web address] - password [email or web address]  And look for the night coverage person covering for me after hours  [facility] Office  [phone]

## 2017-03-15 NOTE — Care Management Note (Addendum)
Case Management Note  Patient Details  Name: Jean Moore MRN: [id number] Date of Birth: 28-Dec-1926  Subjective/Objective:     CHF              Action/Plan: Patient lives at home with her son receiving HHC services [name]; PCP: Nyra Market, MD; has private insurance with [facility] with prescription drug coverage; awaiting for Physical Therapy eval for disposition needs; CM will continue to follow for progression of care.        Expected Discharge Date:    possibly 03/20/2017              Expected Discharge Plan:  Home w Home Health Services vs short term SNF  Discharge planning Services  CM Consult  Status of Service:  In process, will continue to follow  Reola Mosher [phone] 03/15/2017, 1:36 PM

## 2017-03-15 NOTE — Plan of Care (Signed)
  Education: Knowledge of General Education information will improve 03/15/2017 1145 - Progressing by Loel Lofty, RN

## 2017-03-15 NOTE — Plan of Care (Signed)
  Education: Knowledge of General Education information will improve 03/15/2017 0054 - Progressing by Jeanella Flattery, RN   Health Behavior/Discharge Planning: Ability to manage health-related needs will improve 03/15/2017 0054 - Progressing by Jeanella Flattery, RN   Clinical Measurements: Will remain free from infection 03/15/2017 0054 - Progressing by Jeanella Flattery, RN

## 2017-03-16 ENCOUNTER — Encounter (HOSPITAL_COMMUNITY): Payer: Self-pay | Admitting: Primary Care

## 2017-03-16 DIAGNOSIS — Z515 Encounter for palliative care: Secondary | ICD-10-CM

## 2017-03-16 DIAGNOSIS — Z7189 Other specified counseling: Secondary | ICD-10-CM

## 2017-03-16 LAB — BASIC METABOLIC PANEL
ANION GAP: 14 (ref 5–15)
BUN: 47 mg/dL — ABNORMAL HIGH (ref 6–20)
CALCIUM: 8.1 mg/dL — AB (ref 8.9–10.3)
CO2: 25 mmol/L (ref 22–32)
Chloride: 95 mmol/L — ABNORMAL LOW (ref 101–111)
Creatinine, Ser: 1.39 mg/dL — ABNORMAL HIGH (ref 0.44–1.00)
GFR, EST AFRICAN AMERICAN: 38 mL/min — AB (ref >60)
GFR, EST NON AFRICAN AMERICAN: 33 mL/min — AB (ref >60)
GLUCOSE: 353 mg/dL — AB (ref 65–99)
POTASSIUM: 3.7 mmol/L (ref 3.5–5.1)
Sodium: 134 mmol/L — ABNORMAL LOW (ref 135–145)

## 2017-03-16 LAB — CBC
HCT: 34.4 % — ABNORMAL LOW (ref 36.0–46.0)
HEMOGLOBIN: 10.8 g/dL — AB (ref 12.0–15.0)
MCH: 26.8 pg (ref 26.0–34.0)
MCHC: 31.4 g/dL (ref 30.0–36.0)
MCV: 85.4 fL (ref 78.0–100.0)
PLATELETS: 106 10*3/uL — AB (ref 150–400)
RBC: 4.03 MIL/uL (ref 3.87–5.11)
RDW: 18.2 % — ABNORMAL HIGH (ref 11.5–15.5)
WBC: 6.7 10*3/uL (ref 4.0–10.5)

## 2017-03-16 LAB — BRAIN NATRIURETIC PEPTIDE

## 2017-03-16 MED ORDER — FUROSEMIDE 80 MG PO TABS
80.0000 mg | ORAL_TABLET | Freq: Two times a day (BID) | ORAL | Status: DC
  Administered 2017-03-16 – 2017-03-17 (×3): 80 mg via ORAL
  Filled 2017-03-16 (×3): qty 1

## 2017-03-16 NOTE — Consult Note (Signed)
Consultation Note Date: 03/16/2017   Patient Name: Jean Moore  DOB: 1926/08/17  MRN: [id number]  Age / Sex: 84 y.o., female  PCP: Nyra Market, MD Referring Physician: Edsel Petrin, DO  Reason for Consultation: Establishing goals of care and Psychosocial/spiritual support  HPI/Patient Profile: 84 y. o. female  with past medical history of atrial fibrillation on Coumadin since 2012, recently stopped due to vaginal bleeding, aortic stenosis, congestive heart failure, high blood pressure and cholesterol, frailty with decreased functional status and mobility admitted on 03/13/2017 with heart failure exacerbation.   Clinical Assessment and Goals of Care: Mrs. Lippert is resting quietly in bed.  She is in no distress, calm and cooperative.  She is very pleasant.   present today at bedside is daughter, Avel Peace.  We talked about rehab in detail including what is and is not offered, mandated participation, and requesting 3 choices from family facility.  Family is requesting first choices [facility] the second is a facility on [place], unable to give name.Daughter Truddie Hidden calls son Caryn Bee on the phone to discuss placement choices.  Conference with social worker related to family's choice of rehab.  Mrs. Ulyess Blossom and I talk about Mrs. Hehir's chronic health conditions including her heart problems.  We speak in detail about A. fib.  We speak in detail about heart failure including Mrs. Powells EF of 15%.  I shared that this is likely why she is weaker.  I share that rehab may improve the strength in her legs and her balance, but will not improve her heart function.    Mrs. Lachapelle speaks about her faith.  She shares that God can heal her and help her, and daughter Reynolds Bowl states that they realize that even if she is not healed physically here when she passes she will receive the ultimate  healing.  Patient and family seem knowledgeable about Mrs. Midgley's wishes.  We talked about multiple hospital visits, multiple doctors visits, and that Mrs. Seybold decides if she has the energy/desire to see multiple doctors.  Conference with social worker related to disposition. [place] form (DNR) completed.  Healthcare power of attorney HCPOA -son Alger Simons "Caryn Bee" Duprie his first, daughter Cline Cools is second.  Mrs. Suchocki also has adult children Onalee Hua, Thea Silversmith, and [place] who lives in [place].   SUMMARY OF RECOMMENDATIONS   Patient and family agreeable to SNF rehab.  Code Status/Advance Care Planning:  DNR  Symptom Management:   Per hospitalist, no additional needs at this time.  Palliative Prophylaxis:   Frequent Pain Assessment and Turn Reposition  Additional Recommendations (Limitations, Scope, Preferences):  Continue to treat the treatable but no CPR, no intubation.  Psycho-social/Spiritual:   Desire for further Chaplaincy support:no  Additional Recommendations: Caregiving  Support/Resources and Education on Hospice  Prognosis:   < 12 months, possibly 6 months or less based on functional status,  advanced age, advanced heart failure with a EF of 15%.  Discharge Planning: Patient and family are agreeable to SNF rehab.  Choices requested.  Primary Diagnoses: Present on Admission: . Acute on chronic systolic CHF (congestive heart failure) (HCC) . Hyperkalemia . Acute on chronic renal failure (HCC) . Essential hypertension . Multinodular goiter . PAD (peripheral artery disease) (HCC) . Atrial fibrillation, chronic (HCC) . Dyslipidemia   I have reviewed the medical record, interviewed the patient and family, and examined the patient. The following aspects are pertinent.  Past Medical History:  Diagnosis Date  . Aortic stenosis   . Atrial fibrillation (HCC)    on coumadin since 2012, recently stopped because of vaginal bleeding  . CHF (congestive  heart failure) (HCC)   . Dyspnea   . Hyperlipidemia   . Hypertension   . Routine adult health maintenance 04/09/2012   Social History   Socioeconomic History  . Marital status: Widowed    Spouse name: None  . Number of children: None  . Years of education: None  . Highest education level: None  Social Needs  . Financial resource strain: None  . Food insecurity - worry: None  . Food insecurity - inability: None  . Transportation needs - medical: None  . Transportation needs - non-medical: None  Occupational History  . None  Tobacco Use  . Smoking status: Never Smoker  . Smokeless tobacco: Never Used  Substance and Sexual Activity  . Alcohol use: No    Alcohol/week: 0.0 oz  . Drug use: No  . Sexual activity: None  Other Topics Concern  . None  Social History Narrative  . None   Family History  Problem Relation Age of Onset  . Hypertension Mother   . Hypertension Father   . Thyroid disease Neg Hx    Scheduled Meds: . aspirin EC  81 mg Oral Daily  . atorvastatin  40 mg Oral q1800  . brimonidine  1 drop Both Eyes BID   And  . timolol  1 drop Both Eyes BID  . clopidogrel  75 mg Oral Daily  . digoxin  0.125 mg Oral Daily  . dorzolamide  1 drop Both Eyes BID  . feeding supplement (ENSURE ENLIVE)  237 mL Oral BID BM  . furosemide  80 mg Oral BID  . heparin  5,000 Units Subcutaneous Q12H  . latanoprost  1 drop Both Eyes QHS  . metoprolol succinate  50 mg Oral Daily  . sodium chloride flush  3 mL Intravenous Q12H   Continuous Infusions: . sodium chloride    . cefTRIAXone (ROCEPHIN)  IV 1 g (03/16/17 1047)   PRN Meds:.sodium chloride, acetaminophen, ipratropium-albuterol, ondansetron (ZOFRAN) IV, polyvinyl alcohol, sodium chloride flush Medications Prior to Admission:  Prior to Admission medications   Medication Sig Start Date End Date Taking? Authorizing Provider  acetaminophen (TYLENOL) 500 MG tablet Take 500 mg by mouth every 6 (six) hours as needed for pain.    Yes [provider]  aspirin 81 MG tablet Take 1 tablet (81 mg total) by mouth daily. 05/06/13  Yes Clegg, Amy D, NP  atorvastatin (LIPITOR) 40 MG tablet TAKE 1 TABLET BY MOUTH DAILY AT 6 PM. Patient taking differently: TAKE 40 mg TABLET BY MOUTH in the evening 11/14/16  Yes Bensimhon, Bevelyn Buckles, MD  clopidogrel (PLAVIX) 75 MG tablet TAKE 1 TABLET BY MOUTH ONCE DAILY Patient taking differently: TAKE 75 mg TABLET BY MOUTH ONCE DAILY 03/05/17  Yes Maeola Harman, MD  COMBIGAN 0.2-0.5 % ophthalmic solution Place 1 drop into both eyes 2 (two) times daily. 09/20/16  Yes [provider]  digoxin (LANOXIN) 0.125 MG  tablet Take 1 tablet (0.125 mg total) by mouth daily. 04/14/16  Yes Lyn Records, MD  dorzolamide (TRUSOPT) 2 % ophthalmic solution Place 1 drop into both eyes 2 (two) times daily. 09/22/16  Yes [provider]  furosemide (LASIX) 40 MG tablet Take 2 tablets (80 mg total) 2 (two) times daily by mouth. 12/15/16  Yes Ginger Carne, MD  latanoprost (XALATAN) 0.005 % ophthalmic solution Place 1 drop into both eyes at bedtime. 09/20/16  Yes [provider]  metoprolol succinate (TOPROL-XL) 50 MG 24 hr tablet Take 1 tablet (50 mg total) by mouth daily. Take with or immediately following a meal. 04/10/15  Yes Selina Cooley, MD  Multiple Vitamins-Minerals (CENTRUM SILVER 50+WOMEN PO) Take 1 tablet daily by mouth.   Yes [provider]  Polyethyl Glycol-Propyl Glycol (SYSTANE ULTRA OP) Place 1 drop into both eyes 4 (four) times daily as needed (dry eyes).   Yes [provider]  potassium chloride SA (K-DUR,KLOR-CON) 20 MEQ tablet TAKE 1 TABLET BY MOUTH 2 TIMES DAILY. Patient taking differently: TAKE 20 meq TABLET BY MOUTH 2 TIMES DAILY. 02/02/17  Yes Nyra Market, MD  diclofenac sodium (VOLTAREN) 1 % GEL Apply 2 g topically 4 (four) times daily. Patient not taking: Reported on 03/13/2017 01/11/17   Nyra Market, MD  nystatin (MYCOSTATIN) 100000  UNIT/ML suspension Take 5 mLs (500,000 Units total) by mouth 4 (four) times daily. Patient not taking: Reported on 03/13/2017 01/11/17   Nyra Market, MD   Allergies  Allergen Reactions  . Shrimp [Shellfish Allergy] Other (See Comments)    Reaction: Hives, Itching, Bumps, Swelling   Review of Systems  Unable to perform ROS: Age    Physical Exam  Constitutional: She is oriented to person, place, and time.  Appears acutely/chronically ill, somewhat frail, briefly makes and keeps eye contact, calm and cooperative  HENT:  Head: Normocephalic and atraumatic.  Some temporal wasting  Cardiovascular: Normal rate.  Pulmonary/Chest: Effort normal. No respiratory distress.  Abdominal: Soft. She exhibits no distension.  Neurological: She is alert and oriented to person, place, and time.  Skin: Skin is warm and dry.  Psychiatric: Her mood appears not anxious. She is not agitated.  Nursing note and vitals reviewed.   Vital Signs: BP 105/80 (BP Location: Left Arm)   Pulse 62   Temp 97.8 F (36.6 C) (Oral)   Resp 18   Ht 5\' 3"  (1.6 m)   Wt 67.4 kg (148 lb 9.4 oz)   LMP 10/12/1978   SpO2 95%   BMI 26.32 kg/m  Pain Assessment: 0-10   Pain Score: 0-No pain   SpO2: SpO2: 95 % O2 Device:SpO2: 95 % O2 Flow Rate: .O2 Flow Rate (L/min): 3 L/min  IO: Intake/output summary:   Intake/Output Summary (Last 24 hours) at 03/16/2017 1148 Last data filed at 03/16/2017 0901 Gross per 24 hour  Intake 802 ml  Output 150 ml  Net 652 ml    LBM: Last BM Date: 03/14/17 Baseline Weight: Weight: 62.1 kg (137 lb) Most recent weight: Weight: 67.4 kg (148 lb 9.4 oz)     Palliative Assessment/Data:   Flowsheet Rows     Most Recent Value  Intake Tab  Referral Department  Cardiology  Unit at Time of Referral  Cardiac/Telemetry Unit  Palliative Care Primary Diagnosis  Cardiac  Date Notified  03/16/17  Palliative Care Type  Return patient Palliative Care  Reason for referral  Clarify Goals of Care    Date of Admission  03/13/17  Date first seen by Palliative Care  03/16/17  # of days Palliative referral response time  0 Day(s)  # of days IP prior to Palliative referral  3  Clinical Assessment  Palliative Performance Scale Score  40%  Pain Max last 24 hours  Not able to report  Pain Min Last 24 hours  Not able to report  Dyspnea Max Last 24 Hours  Not able to report  Dyspnea Min Last 24 hours  Not able to report  Psychosocial & Spiritual Assessment  Palliative Care Outcomes  Patient/Family meeting held?  Yes  Who was at the meeting?  Patient and daughter Truddie Hidden at bedside, son Caryn Bee via phone  Palliative Care Outcomes  Provided end of life care assistance, Provided advance care planning, Completed durable DNR, Provided psychosocial or spiritual support  Patient/Family wishes: Interventions discontinued/not started   Mechanical Ventilation      Time In: 0 850 Time Out: 1000 Time Total: 70 minutes Greater than 50%  of this time was spent counseling and coordinating care related to the above assessment and plan.  Signed by: Katheran Awe, NP   Please contact Palliative Medicine Team phone at [phone] for questions and concerns.  For individual provider: See Loretha Stapler

## 2017-03-16 NOTE — Progress Notes (Addendum)
PROGRESS NOTE    JOURNEII SHOLAR  VKP:[id number] DOB: Sep 05, 1926 DOA: 03/13/2017 PCP: Nyra Market, MD   Chief Complaint  Patient presents with  . Shortness of Breath    Brief Narrative:  HPI on 03/13/2017 by Dr. Gery Pray this is 84 y/o female who presents with c/o diarrhea since yesterday and again this AM. None in ER. In the ER she developed nausea and vomitng, improved with zofran. She also reports some SOB, mild. She denies chest pain. Over the past 2 weeks she's had increased edema on LE but family reports stable weights.she is weak, now mostly using the wheelchair due to increased respiratory effeot. There is no wheezing, no cough, no fevers, no chills. Her urine output remains unchanged. She has had no sick contacts.  History provided by the patient and her son who is present at bedside.  Interim history Admitted for CHF exac. Continue IV lasix. Will consult palliative care to discuss goals of care.   Assessment & Plan   Acute on chronic systolic heart failure -Patient presented with some shortness of breath as well as lower extremity edema -BNP on admission greater than 4500 -Echocardiogram 03/17/2016 showed an EF of 15%, diffuse hypokinesis -Patient has refused ICD in the past as well as invasive procedures -Continue digoxin -We will transition patient from IV Lasix to oral, and continue to monitor -Monitor intake and output, daily weights-urine output minimal over the past 24 hours, 650 cc.  Daily weights unsure if these are accurate as it appears the patient has gained 4 pounds in 1 day.  History of severe aortic stenosis -Echocardiogram in February 2018 showed severe global reduction in LV function, mild LVH, calcified aortic valve with severe AS -Patient has refused invasive procedures in the past  Hyperkalemia -Resolved, patient was given Kayexalate.  Potassium currently 3.7 -Continue to monitor BMP  Acute kidney injury on chronic kidney disease, stage III -Likely  secondary to diuresis -Creatinine appears to be improving, currently 1.39 (creatinine peaked at 1.85) -Continue to monitor BMP  Elevated troponin -Likely secondary to renal failure and demand -Troponin peaked at 0.21, was 0.11 prior -Currently denies chest pain  Gastroenteritis -No longer having diarrhea.  Patient had formed stool in the emergency department -Influenza PCR negative  Essential hypertension -Continue metoprolol  PAD -Continue Plavix, statin, aspirin  Dyslipidemia  -Continue statin  Leukocytosis -Chest x-ray on admission showed left basilar changes consistent with effusion and underlying infiltrate -Patient was started on ceftriaxone -Leukocytosis resolved  Atrial fibrillation -Currently not on anticoagulation, refused in the past -Continue metoprolol and digoxin  Goals of care -Discussed with patient as well as son at bedside. -Unsure if patient and family are truly aware of her medical conditions and what to expect moving forward. -Palliative care consulted and appreciated -Patient currently DNR  Deconditioning -PT consulted recommended SNF -Social work consulted -Per family, patient lives with her son.  DVT Prophylaxis heparin  Code Status: DNR  Family Communication: Son at bedside  Disposition Plan: Admitted.  Continue diuresis.  Suspect home on discharge within the next 24-48 hours  Consultants Cardiology  Procedures  none  Antibiotics   Anti-infectives (From admission, onward)   Start     Dose/Rate Route Frequency Ordered Stop   03/14/17 1000  levofloxacin (LEVAQUIN) tablet 500 mg  Status:  Discontinued     500 mg Oral Daily 03/14/17 0726 03/14/17 0731   03/14/17 0815  cefTRIAXone (ROCEPHIN) 1 g in dextrose 5 % 50 mL IVPB     1  g 100 mL/hr over 30 Minutes Intravenous Every 24 hours 03/14/17 0732 03/19/17 0814      Subjective:   Sunset Joshi seen and examined today.  Patient states her breathing has improved.  She continues to  feel weak and inquires about her leg swelling.  Patient does not understand why her leg swelling has not resolved.  Denies any diarrhea, abdominal pain, nausea or vomiting, constipation, dizziness or headache.    Objective:   Vitals:   03/15/17 0533 03/15/17 1203 03/15/17 2110 03/16/17 0519  BP: 104/63 (!) 88/61 109/70 105/80  Pulse: 73 92 65 62  Resp: 18 20 18 18   Temp: 98.1 F (36.7 C) 98.3 F (36.8 C) 99 F (37.2 C) 97.8 F (36.6 C)  TempSrc: Oral Oral Oral Oral  SpO2: 98% 97% 95% 95%  Weight: 65.4 kg (144 lb 2.9 oz)   67.4 kg (148 lb 9.4 oz)  Height:        Intake/Output Summary (Last 24 hours) at 03/16/2017 1030 Last data filed at 03/16/2017 0901 Gross per 24 hour  Intake 802 ml  Output 650 ml  Net 152 ml   Filed Weights   03/14/17 0628 03/15/17 0533 03/16/17 0519  Weight: 66 kg (145 lb 8.1 oz) 65.4 kg (144 lb 2.9 oz) 67.4 kg (148 lb 9.4 oz)    Exam  General: Well developed, elderly, no apparent distress  HEENT: NCAT, mucous membranes moist.   Cardiovascular: S1 S2 auscultated, irregular, 3/6 SEM  Respiratory: Clear to auscultation bilaterally with equal chest rise  Abdomen: Soft, nontender, nondistended, + bowel sounds  Extremities: warm dry without cyanosis clubbing.  Lower extremity edema improving  Neuro: AAOx3, nonfocal  Psych: pleasant, appropriate  Data Reviewed: I have personally reviewed following labs and imaging studies  CBC: Recent Labs  Lab 03/13/17 1550 03/14/17 0619 03/15/17 0528 03/16/17 0509  WBC 14.5* 15.9*  15.5* 9.2 6.7  NEUTROABS 12.7* 13.2*  --   --   HGB 13.2 11.2*  11.2* 11.0* 10.8*  HCT 41.7 35.5*  35.2* 35.0* 34.4*  MCV 86.3 86.2  86.1 85.4 85.4  PLT 136* 133*  121* 112* 106*   Basic Metabolic Panel: Recent Labs  Lab 03/13/17 1550 03/13/17 2210 03/14/17 0619 03/15/17 0528 03/16/17 0509  NA 134* 135 136 133* 134*  K 6.2* 6.4* 5.7* 4.2 3.7  CL 98* 98* 98* 96* 95*  CO2 24 21* 23 24 25   GLUCOSE 196* 146* 215*  273* 353*  BUN 35* 38* 41* 47* 47*  CREATININE 1.36* 1.56* 1.85*  1.84* 1.68* 1.39*  CALCIUM 9.0 9.2 9.0 8.5* 8.1*  MG  --   --  2.3  --   --    GFR: Estimated Creatinine Clearance: 25.8 mL/min (A) (by C-G formula based on SCr of 1.39 mg/dL (H)). Liver Function Tests: Recent Labs  Lab 03/13/17 1550  AST 54*  ALT 40  ALKPHOS 102  BILITOT 1.5*  PROT 7.7  ALBUMIN 2.8*   No results for input(s): LIPASE, AMYLASE in the last 168 hours. No results for input(s): AMMONIA in the last 168 hours. Coagulation Profile: No results for input(s): INR, PROTIME in the last 168 hours. Cardiac Enzymes: Recent Labs  Lab 03/13/17 1550 03/13/17 1600 03/14/17 0619  TROPONINI 0.11* 0.11* 0.21*   BNP (last 3 results) No results for input(s): PROBNP in the last 8760 hours. HbA1C: No results for input(s): HGBA1C in the last 72 hours. CBG: No results for input(s): GLUCAP in the last 168 hours. Lipid Profile: No  results for input(s): CHOL, HDL, LDLCALC, TRIG, CHOLHDL, LDLDIRECT in the last 72 hours. Thyroid Function Tests: No results for input(s): TSH, T4TOTAL, FREET4, T3FREE, THYROIDAB in the last 72 hours. Anemia Panel: No results for input(s): VITAMINB12, FOLATE, FERRITIN, TIBC, IRON, RETICCTPCT in the last 72 hours. Urine analysis:    Component Value Date/Time   COLORURINE AMBER (A) 04/14/2015 1856   APPEARANCEUR CLOUDY (A) 04/14/2015 1856   LABSPEC 1.026 04/14/2015 1856   PHURINE 7.5 04/14/2015 1856   GLUCOSEU NEGATIVE 04/14/2015 1856   HGBUR NEGATIVE 04/14/2015 1856   BILIRUBINUR SMALL (A) 04/14/2015 1856   KETONESUR NEGATIVE 04/14/2015 1856   PROTEINUR 100 (A) 04/14/2015 1856   NITRITE NEGATIVE 04/14/2015 1856   LEUKOCYTESUR SMALL (A) 04/14/2015 1856   Sepsis Labs: @LABRCNTIP (procalcitonin:4,lacticidven:4)  )No results found for this or any previous visit (from the past 240 hour(s)).    Radiology Studies: No results found.   Scheduled Meds: . aspirin EC  81 mg Oral Daily   . atorvastatin  40 mg Oral q1800  . brimonidine  1 drop Both Eyes BID   And  . timolol  1 drop Both Eyes BID  . clopidogrel  75 mg Oral Daily  . digoxin  0.125 mg Oral Daily  . dorzolamide  1 drop Both Eyes BID  . feeding supplement (ENSURE ENLIVE)  237 mL Oral BID BM  . furosemide  40 mg Intravenous Q12H  . heparin  5,000 Units Subcutaneous Q12H  . latanoprost  1 drop Both Eyes QHS  . metoprolol succinate  50 mg Oral Daily  . sodium chloride flush  3 mL Intravenous Q12H   Continuous Infusions: . sodium chloride    . cefTRIAXone (ROCEPHIN)  IV Stopped (03/15/17 1235)     LOS: 3 days   Time Spent in minutes   45 minutes (greater than 50% of this time was spent with the patient reviewing labs, discussing CHF as well as nutritional status, goals of care, and counseling patient and family member at bedside.)  Edsel Petrin D.O. on 03/16/2017 at 10:30 AM  Between 7am to 7pm - Pager - [phone]  After 7pm go to [email or web address] - password [email or web address]  And look for the night coverage person covering for me after hours  [facility] Office  [phone]

## 2017-03-16 NOTE — Progress Notes (Signed)
Pharmacist Heart Failure Core Measure Documentation  Assessment: Jean Moore has an EF documented as 15% on 03/17/17 by ECHO.  Rationale: Heart failure patients with left ventricular systolic dysfunction (LVSD) and an EF < 40% should be prescribed an angiotensin converting enzyme inhibitor (ACEI) or angiotensin receptor blocker (ARB) at discharge unless a contraindication is documented in the medical record.  This patient is not currently on an ACEI or ARB for HF.  This note is being placed in the record in order to provide documentation that a contraindication to the use of these agents is present for this encounter.  ACE Inhibitor or Angiotensin Receptor Blocker is contraindicated (specify all that apply)  []   ACEI allergy AND ARB allergy []   Angioedema [x]   Moderate or severe aortic stenosis []   Hyperkalemia -  improving []   Hypotension - improving []   Renal artery stenosis [x]   Worsening renal function, preexisting renal disease or dysfunction   Cambrea Kirt D. Laney Potash, PharmD, BCPS Pager:  [phone] 03/16/2017, 11:05 AM

## 2017-03-16 NOTE — Plan of Care (Signed)
  Education: Knowledge of General Education information will improve 03/16/2017 [phone] - Progressing by Loel Lofty, RN

## 2017-03-16 NOTE — [place] FL2 (Signed)
[place] MEDICAID FL2 LEVEL OF CARE SCREENING TOOL     IDENTIFICATION  Patient Name: Jean Moore Birthdate: May 17, 1926 Sex: female Admission Date (Current Location): 03/13/2017  [facility] [place] Number:  Producer, television/film/video and Address:  [facility], [address] [place], [place] [address]      Provider Number: [id number]  Attending Physician Name and Address:  Edsel Petrin, DO  Relative Name and Phone Number:       Current Level of Care: Hospital Recommended Level of Care: Skilled Nursing Facility Prior Approval Number:    Date Approved/Denied:   PASRR Number: [id number]  Discharge Plan: SNF    Current Diagnoses: Patient Active Problem List   Diagnosis Date Noted  . Palliative care by specialist   . Goals of care, counseling/discussion   . Hyperkalemia 03/13/2017  . Acute on chronic renal failure (HCC) 03/13/2017  . Atrial fibrillation, chronic (HCC) 03/13/2017  . Dyslipidemia 03/13/2017  . Left leg pain 01/14/2017  . Thrush, oral 01/11/2017  . PAD (peripheral artery disease) (HCC)   . Palliative care encounter   . Skin ulcers of both feet (HCC) 10/05/2016  . Sleep-wake cycle disorder 10/05/2016  . Urge incontinence 06/23/2016  . Toenail torn away 06/23/2016  . Hoarseness 05/19/2016  . Tinea pedis 05/05/2016  . Severe aortic stenosis 04/14/2016  . Encounter for end of life care 05/07/2015  . DNR (do not resuscitate) 05/07/2015  . Multinodular goiter 08/21/2014  . Stenosis of right internal carotid artery 05/07/2014  . Cataract 05/15/2013  . Paroxysmal atrial fibrillation (HCC) 04/09/2012  . Acute on chronic systolic CHF (congestive heart failure) (HCC) 04/09/2012  . Routine adult health maintenance 04/09/2012  . Essential hypertension 03/08/2007    Orientation RESPIRATION BLADDER Height & Weight     Self, Time, Situation, Place  Normal Incontinent, External catheter Weight: 148 lb 9.4 oz (67.4 kg) Height:  5\' 3"   (160 cm)  BEHAVIORAL SYMPTOMS/MOOD NEUROLOGICAL BOWEL NUTRITION STATUS      Incontinent Diet(see DC summary)  AMBULATORY STATUS COMMUNICATION OF NEEDS Skin   Extensive Assist Verbally Normal                       Personal Care Assistance Level of Assistance  Bathing, Dressing Bathing Assistance: Maximum assistance   Dressing Assistance: Maximum assistance     Functional Limitations Info             SPECIAL CARE FACTORS FREQUENCY  PT (By licensed PT), OT (By licensed OT)     PT Frequency: 5/wk OT Frequency: 5/wk            Contractures      Additional Factors Info  Code Status, Allergies Code Status Info: DNR Allergies Info: Shrimp Shellfish Allergy           Current Medications (03/16/2017):  This is the current hospital active medication list Current Facility-Administered Medications  Medication Dose Route Frequency Provider Last Rate Last Dose  . 0.9 %  sodium chloride infusion  250 mL Intravenous PRN Crosley, Debby, MD      . acetaminophen (TYLENOL) tablet 650 mg  650 mg Oral Q4H PRN Crosley, Debby, MD      . aspirin EC tablet 81 mg  81 mg Oral Daily Crosley, Debby, MD   81 mg at 03/16/17 1048  . atorvastatin (LIPITOR) tablet 40 mg  40 mg Oral q1800 Crosley, Debby, MD   40 mg at 03/15/17 1756  . brimonidine (  ALPHAGAN) 0.2 % ophthalmic solution 1 drop  1 drop Both Eyes BID Crosley, Debby, MD   1 drop at 03/16/17 1049   And  . timolol (TIMOPTIC) 0.5 % ophthalmic solution 1 drop  1 drop Both Eyes BID Gery Pray, MD   1 drop at 03/16/17 1307  . cefTRIAXone (ROCEPHIN) 1 g in dextrose 5 % 50 mL IVPB  1 g Intravenous Q24H Kendell Bane, MD   Stopped at 03/16/17 1308  . clopidogrel (PLAVIX) tablet 75 mg  75 mg Oral Daily Crosley, Debby, MD   75 mg at 03/16/17 1048  . digoxin (LANOXIN) tablet 0.125 mg  0.125 mg Oral Daily Crosley, Debby, MD   0.125 mg at 03/16/17 1048  . dorzolamide (TRUSOPT) 2 % ophthalmic solution 1 drop  1 drop Both Eyes BID Joneen Roach,  Debby, MD   1 drop at 03/16/17 1049  . feeding supplement (ENSURE ENLIVE) (ENSURE ENLIVE) liquid 237 mL  237 mL Oral BID BM Shahmehdi, Seyed A, MD   237 mL at 03/16/17 1052  . furosemide (LASIX) tablet 80 mg  80 mg Oral BID Edsel Petrin, DO      . heparin injection 5,000 Units  5,000 Units Subcutaneous Q12H Gery Pray, MD   5,000 Units at 03/16/17 1051  . ipratropium-albuterol (DUONEB) 0.5-2.5 (3) MG/3ML nebulizer solution 3 mL  3 mL Nebulization Q6H PRN Crosley, Debby, MD      . latanoprost (XALATAN) 0.005 % ophthalmic solution 1 drop  1 drop Both Eyes QHS Crosley, Debby, MD   1 drop at 03/15/17 2248  . metoprolol succinate (TOPROL-XL) 24 hr tablet 50 mg  50 mg Oral Daily Crosley, Debby, MD   50 mg at 03/16/17 1048  . ondansetron (ZOFRAN) injection 4 mg  4 mg Intravenous Q6H PRN Crosley, Debby, MD      . polyvinyl alcohol (LIQUIFILM TEARS) 1.4 % ophthalmic solution   Both Eyes QID PRN Crosley, Debby, MD      . sodium chloride flush (NS) 0.9 % injection 3 mL  3 mL Intravenous Q12H Crosley, Debby, MD   3 mL at 03/16/17 1052  . sodium chloride flush (NS) 0.9 % injection 3 mL  3 mL Intravenous PRN Gery Pray, MD         Discharge Medications: Please see discharge summary for a list of discharge medications.  Relevant Imaging Results:  Relevant Lab Results:   Additional Information SS#: [id number]  Burna Sis, LCSW

## 2017-03-17 DIAGNOSIS — R0902 Hypoxemia: Secondary | ICD-10-CM

## 2017-03-17 DIAGNOSIS — E440 Moderate protein-calorie malnutrition: Secondary | ICD-10-CM

## 2017-03-17 DIAGNOSIS — Z7189 Other specified counseling: Secondary | ICD-10-CM

## 2017-03-17 LAB — POTASSIUM: POTASSIUM: 4.6 mmol/L (ref 3.5–5.1)

## 2017-03-17 LAB — BASIC METABOLIC PANEL
Anion gap: 12 (ref 5–15)
BUN: 52 mg/dL — ABNORMAL HIGH (ref 6–20)
CALCIUM: 8.5 mg/dL — AB (ref 8.9–10.3)
CHLORIDE: 93 mmol/L — AB (ref 101–111)
CO2: 27 mmol/L (ref 22–32)
Creatinine, Ser: 1.29 mg/dL — ABNORMAL HIGH (ref 0.44–1.00)
GFR calc non Af Amer: 36 mL/min — ABNORMAL LOW (ref >60)
GFR, EST AFRICAN AMERICAN: 42 mL/min — AB (ref >60)
GLUCOSE: 398 mg/dL — AB (ref 65–99)
POTASSIUM: 5.3 mmol/L — AB (ref 3.5–5.1)
Sodium: 132 mmol/L — ABNORMAL LOW (ref 135–145)

## 2017-03-17 LAB — HEMOGLOBIN A1C
Hgb A1c MFr Bld: 9.1 % — ABNORMAL HIGH (ref 4.8–5.6)
Mean Plasma Glucose: 214.47 mg/dL

## 2017-03-17 LAB — CBC
HEMATOCRIT: 36.6 % (ref 36.0–46.0)
HEMOGLOBIN: 11.5 g/dL — AB (ref 12.0–15.0)
MCH: 27.1 pg (ref 26.0–34.0)
MCHC: 31.4 g/dL (ref 30.0–36.0)
MCV: 86.1 fL (ref 78.0–100.0)
PLATELETS: 115 10*3/uL — AB (ref 150–400)
RBC: 4.25 MIL/uL (ref 3.87–5.11)
RDW: 18.4 % — ABNORMAL HIGH (ref 11.5–15.5)
WBC: 7.7 10*3/uL (ref 4.0–10.5)

## 2017-03-17 LAB — BRAIN NATRIURETIC PEPTIDE: B Natriuretic Peptide: >4500 pg/mL — ABNORMAL HIGH (ref 0.0–100.0)

## 2017-03-17 LAB — GLUCOSE, CAPILLARY: GLUCOSE-CAPILLARY: 454 mg/dL — AB (ref 65–99)

## 2017-03-17 MED ORDER — INSULIN ASPART 100 UNIT/ML ~~LOC~~ SOLN
0.0000 [IU] | Freq: Three times a day (TID) | SUBCUTANEOUS | Status: DC
  Administered 2017-03-17: 12 [IU] via SUBCUTANEOUS

## 2017-03-17 MED ORDER — SODIUM POLYSTYRENE SULFONATE PO POWD
15.0000 g | Freq: Once | ORAL | Status: AC
  Administered 2017-03-17: 15 g via ORAL
  Filled 2017-03-17: qty 15

## 2017-03-17 MED ORDER — ENSURE ENLIVE PO LIQD: 237.0000 mL | Freq: Two times a day (BID) | ORAL | 0 refills | Status: AC

## 2017-03-17 MED ORDER — INSULIN ASPART 100 UNIT/ML ~~LOC~~ SOLN: SUBCUTANEOUS | 0 refills | Status: AC

## 2017-03-17 MED ORDER — SODIUM POLYSTYRENE SULFONATE 15 GM/60ML PO SUSP
15.0000 g | Freq: Once | ORAL | Status: DC
  Filled 2017-03-17: qty 60

## 2017-03-17 NOTE — Discharge Summary (Addendum)
Physician Discharge Summary  Jean Moore:[id number] DOB: 1926-09-26 DOA: 03/13/2017  PCP: Nyra Market, MD  Admit date: 03/13/2017 Discharge date: 03/17/2017  Time spent: 45 minutes  Recommendations for Outpatient Follow-up:  Patient will be discharged to [facility] skilled nursing facility. Continue physical and occupational therapy.  Patient will need to follow up with primary care provider within one week of discharge, repeat BMP. Patient should continue medications as prescribed.  Patient should follow a heart healthy diet.   Discharge Diagnoses:  Acute on chronic systolic heart failure History of severe aortic stenosis Hyperkalemia Acute kidney injury on chronic kidney disease, stage III Elevated troponin Gastroenteritis Essential hypertension PAD Dyslipidemia  Leukocytosis Atrial fibrillation Goals of care Deconditioning Diabetes mellitus, type II  Discharge Condition: stable  Diet recommendation: heart healthy  Filed Weights   03/15/17 0533 03/16/17 0519 03/17/17 0540  Weight: 65.4 kg (144 lb 2.9 oz) 67.4 kg (148 lb 9.4 oz) 67.6 kg (149 lb)    History of present illness:  on 03/13/2017 by Dr. Gery Pray this is 84 y/o female who presents with c/o diarrhea since yesterday and again this AM. None in ER. In the ER she developed nausea and vomitng, improved with zofran. She also reports some SOB, mild. She denies chest pain. Over the past 2 weeks she's had increased edema on LE but family reports stable weights.she is weak, now mostly using the wheelchair due to increased respiratory effeot. There is no wheezing, no cough, no fevers, no chills. Her urine output remains unchanged. She has had no sick contacts. History provided by the patient and her son who is present at bedside.  Hospital Course:  Acute on chronic systolic heart failure -Patient presented with some shortness of breath as well as lower extremity edema -BNP on admission greater than  4500 -Echocardiogram 03/17/2016 showed an EF of 15%, diffuse hypokinesis -Patient has refused ICD in the past as well as invasive procedures -Continue digoxin -transitioned from IV lasix to oral  -Monitor intake and output, daily weights-urine output minimal over the past 24 hours, 850 cc.   -Daily weights do not appear to be accurate as patient continues to gain weight, however, clinically she is improving and her LE edema is improving.   History of severe aortic stenosis -Echocardiogram in February 2018 showed severe global reduction in LV function, mild LVH, calcified aortic valve with severe AS -Patient has refused invasive procedures in the past  Hyperkalemia -K 5.3 given dose of kayexalate  -repeat K 4.6  Acute kidney injury on chronic kidney disease, stage III -Likely secondary to diuresis -Creatinine appears to be improving, currently 1.29 (creatinine peaked at 1.85)  Elevated troponin -Likely secondary to renal failure and demand -Troponin peaked at 0.21, was 0.11 prior -Currently denies chest pain  Gastroenteritis -No longer having diarrhea.  Patient had formed stool in the emergency department -Influenza PCR negative  Essential hypertension -Continue metoprolol  PAD -Continue Plavix, statin, aspirin  Dyslipidemia  -Continue statin  Leukocytosis -Chest x-ray on admission showed left basilar changes consistent with effusion and underlying infiltrate -Patient was started on ceftriaxone -Leukocytosis resolved  Atrial fibrillation -Currently not on anticoagulation, refused in the past -Continue metoprolol and digoxin  Goals of care -Discussed with patient as well as son at bedside. -Unsure if patient and family are truly aware of her medical conditions and what to expect moving forward. -Palliative care consulted and appreciated -Patient currently DNR   Deconditioning -PT consulted recommended SNF -Social work consulted -Per family, patient  lives with her son.  Moderate malnutrition -Nutrition consulted, continue supplements  Diabetes mellitus, type II -A1c 9.1 -patient doe not have good oral intake -placed on insulin sliding scale and will need to follow up with her PCP  Procedures: None  Consultations: Cardiology  Discharge Exam: Vitals:   03/17/17 0540 03/17/17 1203  BP: 109/75 127/76  Pulse: 64 94  Resp: 18 18  Temp: 97.7 F (36.5 C)   SpO2: 98% 95%     General: Well developed, well nourished, NAD, appears stated age  HEENT: NCAT, mucous membranes moist. Poor denition  Cardiovascular: S1 S2 auscultated,3/6SEM, RRR.  Respiratory: Clear to auscultation bilaterally with equal chest rise  Abdomen: Soft, nontender, nondistended, + bowel sounds  Extremities: warm dry without cyanosis clubbing. LE edema improving.  Neuro: AAOx3, nonfocal  Discharge Instructions Discharge Instructions    Discharge instructions   Complete by:  As directed    Patient will be discharged to [facility] skilled nursing facility. Continue physical and occupational therapy.  Patient will need to follow up with primary care provider within one week of discharge, repeat BMP. Patient should continue medications as prescribed.  Patient should follow a heart healthy diet.     Allergies as of 03/17/2017      Reactions   Shrimp [shellfish Allergy] Other (See Comments)   Reaction: Hives, Itching, Bumps, Swelling      Medication List    STOP taking these medications   diclofenac sodium 1 % Gel Commonly known as:  VOLTAREN   nystatin 100000 UNIT/ML suspension Commonly known as:  MYCOSTATIN     TAKE these medications   acetaminophen 500 MG tablet Commonly known as:  TYLENOL Take 500 mg by mouth every 6 (six) hours as needed for pain.   aspirin 81 MG tablet Take 1 tablet (81 mg total) by mouth daily.   atorvastatin 40 MG tablet Commonly known as:  LIPITOR TAKE 1 TABLET BY MOUTH DAILY AT 6 PM. What changed:  See the  new instructions.   CENTRUM SILVER 50+WOMEN PO Take 1 tablet daily by mouth.   clopidogrel 75 MG tablet Commonly known as:  PLAVIX TAKE 1 TABLET BY MOUTH ONCE DAILY What changed:    how much to take  how to take this  when to take this   COMBIGAN 0.2-0.5 % ophthalmic solution Generic drug:  brimonidine-timolol Place 1 drop into both eyes 2 (two) times daily.   digoxin 0.125 MG tablet Commonly known as:  LANOXIN Take 1 tablet (0.125 mg total) by mouth daily.   dorzolamide 2 % ophthalmic solution Commonly known as:  TRUSOPT Place 1 drop into both eyes 2 (two) times daily.   feeding supplement (ENSURE ENLIVE) Liqd Take 237 mLs by mouth 2 (two) times daily between meals.   furosemide 40 MG tablet Commonly known as:  LASIX Take 2 tablets (80 mg total) 2 (two) times daily by mouth.   insulin aspart 100 UNIT/ML injection Commonly known as:  novoLOG CBG 121 - 150: 1 unit, CBG 151 - 200: 2 units, CBG 201 - 250: 3 units, CBG 251 - 300: 5 units, CBG 301 - 350: 7 units,  CBG 351 - 400: 9 units   latanoprost 0.005 % ophthalmic solution Commonly known as:  XALATAN Place 1 drop into both eyes at bedtime.   metoprolol succinate 50 MG 24 hr tablet Commonly known as:  TOPROL-XL Take 1 tablet (50 mg total) by mouth daily. Take with or immediately following a meal.   potassium chloride  SA 20 MEQ tablet Commonly known as:  K-DUR,KLOR-CON TAKE 1 TABLET BY MOUTH 2 TIMES DAILY. What changed:    how much to take  how to take this  when to take this   SYSTANE ULTRA OP Place 1 drop into both eyes 4 (four) times daily as needed (dry eyes).      Allergies  Allergen Reactions  . Shrimp [Shellfish Allergy] Other (See Comments)    Reaction: Hives, Itching, Bumps, Swelling    Contact information for follow-up providers    Nyra Market, MD. Schedule an appointment as soon as possible for a visit in 1 week(s).   Specialty:  Internal Medicine Why:  Hospital follow up Contact  information: [address] [place] [address] [phone]            Contact information for after-discharge care    Destination    [facility] Follow up.   Service:  Skilled Nursing Contact information: [address] [place] [address] [phone]                   The results of significant diagnostics from this hospitalization (including imaging, microbiology, ancillary and laboratory) are listed below for reference.    Significant Diagnostic Studies: Dg Chest Portable 1 View  Result Date: 03/13/2017 CLINICAL DATA:  Shortness of breath for several days EXAM: PORTABLE CHEST 1 VIEW COMPARISON:  12/28/2016 FINDINGS: Cardiac shadow is enlarged. Aortic calcifications are again seen. The lungs are well aerated bilaterally. New left basilar infiltrate with associated effusion is noted. No acute bony abnormality is seen. IMPRESSION: Left basilar changes consistent with effusion and underlying infiltrate. Electronically Signed   By: Alcide Clever M.D.   On: 03/13/2017 16:16   Dg Foot Complete Left  Result Date: 02/16/2017 CLINICAL DATA:  Ulcerated draining wound overlying a bunion. EXAM: LEFT FOOT - COMPLETE 3+ VIEW COMPARISON:  Left foot series of December 13, 2016 FINDINGS: There is prominent hallux valgus contour of the first ray. There is lucency in the overlying soft tissues. The underlying bone exhibits no lytic lesion. There is only mild narrowing of the first MTP joint. The IP joint of the first ray is normal. The second through fifth metatarsals and phalanges exhibit no acute abnormalities. The tarsal bones are intact. There is mild flattening of the plantar arch. There is soft tissue swelling over the dorsum of the midfoot. IMPRESSION: The ulcerated wound in the soft tissues over the medial aspect of the bunion is visible radiographically. There are no underlying bony changes to suggest osteomyelitis. There is mild soft tissue swelling over  the dorsum of the midfoot. Electronically Signed   By: David  [place] M.D.   On: 02/16/2017 08:23    Microbiology: No results found for this or any previous visit (from the past 240 hour(s)).   Labs: Basic Metabolic Panel: Recent Labs  Lab 03/13/17 2210 03/14/17 0619 03/15/17 0528 03/16/17 0509 03/17/17 0431 03/17/17 1234  NA 135 136 133* 134* 132*  --   K 6.4* 5.7* 4.2 3.7 5.3* 4.6  CL 98* 98* 96* 95* 93*  --   CO2 21* 23 24 25 27   --   GLUCOSE 146* 215* 273* 353* 398*  --   BUN 38* 41* 47* 47* 52*  --   CREATININE 1.56* 1.85*  1.84* 1.68* 1.39* 1.29*  --   CALCIUM 9.2 9.0 8.5* 8.1* 8.5*  --   MG  --  2.3  --   --   --   --  Liver Function Tests: Recent Labs  Lab 03/13/17 1550  AST 54*  ALT 40  ALKPHOS 102  BILITOT 1.5*  PROT 7.7  ALBUMIN 2.8*   No results for input(s): LIPASE, AMYLASE in the last 168 hours. No results for input(s): AMMONIA in the last 168 hours. CBC: Recent Labs  Lab 03/13/17 1550 03/14/17 0619 03/15/17 0528 03/16/17 0509 03/17/17 0431  WBC 14.5* 15.9*  15.5* 9.2 6.7 7.7  NEUTROABS 12.7* 13.2*  --   --   --   HGB 13.2 11.2*  11.2* 11.0* 10.8* 11.5*  HCT 41.7 35.5*  35.2* 35.0* 34.4* 36.6  MCV 86.3 86.2  86.1 85.4 85.4 86.1  PLT 136* 133*  121* 112* 106* 115*   Cardiac Enzymes: Recent Labs  Lab 03/13/17 1550 03/13/17 1600 03/14/17 0619  TROPONINI 0.11* 0.11* 0.21*   BNP: BNP (last 3 results) Recent Labs    03/15/17 0528 03/16/17 0509 03/17/17 0431  BNP 3,924.9* >4,500.0* >4,500.0*    ProBNP (last 3 results) No results for input(s): PROBNP in the last 8760 hours.  CBG: No results for input(s): GLUCAP in the last 168 hours.     Signed:  Edsel Petrin  [facility] 03/17/2017, 3:17 PM

## 2017-03-17 NOTE — Plan of Care (Signed)
  Health Behavior/Discharge Planning: Ability to manage health-related needs will improve 03/17/2017 1202 - Progressing by Loel Lofty, RN

## 2017-03-17 NOTE — Progress Notes (Signed)
Inpatient Diabetes Program Recommendations  AACE/ADA: New Consensus Statement on Inpatient Glycemic Control (2015)  Target Ranges:  Prepandial:   less than 140 mg/dL      Peak postprandial:   less than 180 mg/dL (1-2 hours)      Critically ill patients:  140 - 180 mg/dL   Lab Results  Component Value Date   GLUCAP 174 (H) 12/15/2016    Review of Glycemic Control  Diabetes history: No hx Outpatient Diabetes medications: None Current orders for Inpatient glycemic control: None  Inpatient Diabetes Program Recommendations:     Novolog 0-9 units tidwc Check HgbA1C to assess glycemic control prior to admission Allow supplement of choice per pt wishes Add CHO mod to heart healthy diet  Thank you. Ailene Ards, RD, LDN, CDE Inpatient Diabetes Coordinator [phone]

## 2017-03-17 NOTE — Discharge Instructions (Signed)
Heart Failure °Heart failure means your heart has trouble pumping blood. This makes it hard for your body to work well. Heart failure is usually a long-term (chronic) condition. You must take good care of yourself and follow your doctor's treatment plan. °Follow these instructions at home: °· Take your heart medicine as told by your doctor. °? Do not stop taking medicine unless your doctor tells you to. °? Do not skip any dose of medicine. °? Refill your medicines before they run out. °? Take other medicines only as told by your doctor or pharmacist. °· Stay active if told by your doctor. The elderly and people with severe heart failure should talk with a doctor about physical activity. °· Eat heart-healthy foods. Choose foods that are without trans fat and are low in saturated fat, cholesterol, and salt (sodium). This includes fresh or frozen fruits and vegetables, fish, lean meats, fat-free or low-fat dairy foods, whole grains, and high-fiber foods. Lentils and dried peas and beans (legumes) are also good choices. °· Limit salt if told by your doctor. °· Cook in a healthy way. Roast, grill, broil, bake, poach, steam, or stir-fry foods. °· Limit fluids as told by your doctor. °· Weigh yourself every morning. Do this after you pee (urinate) and before you eat breakfast. Write down your weight to give to your doctor. °· Take your blood pressure and write it down if your doctor tells you to. °· Ask your doctor how to check your pulse. Check your pulse as told. °· Lose weight if told by your doctor. °· Stop smoking or chewing tobacco. Do not use gum or patches that help you quit without your doctor's approval. °· Schedule and go to doctor visits as told. °· Nonpregnant women should have no more than 1 drink a day. Men should have no more than 2 drinks a day. Talk to your doctor about drinking alcohol. °· Stop illegal drug use. °· Stay current with shots (immunizations). °· Manage your health conditions as told by your  doctor. °· Learn to manage your stress. °· Rest when you are tired. °· If it is really hot outside: °? Avoid intense activities. °? Use air conditioning or fans, or get in a cooler place. °? Avoid caffeine and alcohol. °? Wear loose-fitting, lightweight, and light-colored clothing. °· If it is really cold outside: °? Avoid intense activities. °? Layer your clothing. °? Wear mittens or gloves, a hat, and a scarf when going outside. °? Avoid alcohol. °· Learn about heart failure and get support as needed. °· Get help to maintain or improve your quality of life and your ability to care for yourself as needed. °Contact a doctor if: °· You gain weight quickly. °· You are more short of breath than usual. °· You cannot do your normal activities. °· You tire easily. °· You cough more than normal, especially with activity. °· You have any or more puffiness (swelling) in areas such as your hands, feet, ankles, or belly (abdomen). °· You cannot sleep because it is hard to breathe. °· You feel like your heart is beating fast (palpitations). °· You get dizzy or light-headed when you stand up. °Get help right away if: °· You have trouble breathing. °· There is a change in mental status, such as becoming less alert or not being able to focus. °· You have chest pain or discomfort. °· You faint. °This information is not intended to replace advice given to you by your health care provider. Make sure you   discuss any questions you have with your health care provider. °Document Released: 11/03/2007 Document Revised: 07/02/2015 Document Reviewed: 03/12/2012 °Elsevier Interactive Patient Education © 2017 [employer] ° °

## 2017-03-17 NOTE — Clinical Social Work Note (Signed)
CSW facilitated patient discharge including contacting patient family and facility to confirm patient discharge plans. Clinical information faxed to facility and family agreeable with plan. CSW arranged ambulance transport via [facility] to [facility]. RN to call report prior to discharge [phone] Room [phone]).  CSW will sign off for now as social work intervention is no longer needed. Please consult [place] again if new needs arise.  Charlynn Court, CSW [phone]

## 2017-03-17 NOTE — Progress Notes (Signed)
Physical Therapy Treatment Patient Details Name: Jean Moore MRN: [id number] DOB: 1926-12-01 Today's Date: 03/17/2017    History of Present Illness Pt is an 84 y.o. female admitted 03/13/17 with c/o SOB and BLE edema; worked up for acute on chronic HF. Elevated troponin likely secondary to renal failure. PMH includes a-fib, CHF, HTN, PAD.     PT Comments    Pt performed transfer from bed to chair and required +2 assistance due to lethargic presentation.  Plan for d/c to SNF this afternoon.  This plan remains appropriate as patient is not safe to return home in her current physical state.      Follow Up Recommendations  SNF;Supervision/Assistance - 24 hour     Equipment Recommendations  None recommended by PT    Recommendations for Other Services       Precautions / Restrictions Precautions Precautions: Fall Restrictions Weight Bearing Restrictions: No    Mobility  Bed Mobility Overal bed mobility: Needs Assistance Bed Mobility: Supine to Sit     Supine to sit: Mod assist;+2 for safety/equipment     General bed mobility comments: Pt required assistance to elevate trunk into sitting and advance B LEs to edge of bed.  Once in sitting patient able to follow commands to scoot forward to the edge of the bed.    Transfers Overall transfer level: Needs assistance Equipment used: Rolling walker (2 wheeled) Transfers: Sit to/from [employer] Sit to Stand: Mod assist;+2 physical assistance;Max assist         General transfer comment: Pt required mod assistance +2 intially but to pivot she required increased assistance to transfer completely from bed to chair.  Pt with flexed posturing and deconditioned presentation during transfer.    Ambulation/Gait Ambulation/Gait assistance: (NT patient too weak to progress to gait training this afternoon.  )               Stairs            Wheelchair Mobility    Modified Rankin (Stroke Patients Only)        Balance Overall balance assessment: Needs assistance   Sitting balance-Leahy Scale: Fair       Standing balance-Leahy Scale: Poor                              Cognition Arousal/Alertness: Lethargic Behavior During Therapy: Flat affect Overall Cognitive Status: Impaired/Different from baseline Area of Impairment: Following commands;Safety/judgement;Awareness;Problem solving                       Following Commands: Follows one step commands with increased time Safety/Judgement: Decreased awareness of safety;Decreased awareness of deficits Awareness: Emergent Problem Solving: Slow processing;Difficulty sequencing;Requires verbal cues;Requires tactile cues;Decreased initiation        Exercises      General Comments        Pertinent Vitals/Pain Pain Assessment: No/denies pain    Home Living                      Prior Function            PT Goals (current goals can now be found in the care plan section) Acute Rehab PT Goals Patient Stated Goal: Return home Potential to Achieve Goals: Good Progress towards PT goals: Progressing toward goals    Frequency    Min 2X/week      PT Plan Current plan remains appropriate  Co-evaluation              AM-PAC PT "6 Clicks" Daily Activity  Outcome Measure  Difficulty turning over in bed (including adjusting bedclothes, sheets and blankets)?: Unable Difficulty moving from lying on back to sitting on the side of the bed? : Unable Difficulty sitting down on and standing up from a chair with arms (e.g., wheelchair, bedside commode, etc,.)?: Unable Help needed moving to and from a bed to chair (including a wheelchair)?: A Lot Help needed walking in hospital room?: Total Help needed climbing 3-5 steps with a railing? : Total 6 Click Score: 7    End of Session Equipment Utilized During Treatment: Gait belt Activity Tolerance: Patient limited by fatigue Patient left: in  chair;with call bell/phone within reach;with family/visitor present Nurse Communication: Mobility status PT Visit Diagnosis: Other abnormalities of gait and mobility (R26.89);Muscle weakness (generalized) (M62.81)     Time: 1436-1500 PT Time Calculation (min) (ACUTE ONLY): 24 min  Charges:  $Therapeutic Activity: 23-37 mins                    G CodesJoycelyn Rua, PTA pager [phone]    Florestine Avers 03/17/2017, 4:59 PM

## 2017-03-17 NOTE — Clinical Social Work Placement (Signed)
   CLINICAL SOCIAL WORK PLACEMENT  NOTE  Date:  03/17/2017  Patient Details  Name: Jean Moore MRN: [id number] Date of Birth: 1927-01-02  Clinical Social Work is seeking post-discharge placement for this patient at the Skilled  Nursing Facility level of care (*CSW will initial, date and re-position this form in  chart as items are completed):  Yes   Patient/family provided with [place] Clinical Social Work Department's list of facilities offering this level of care within the geographic area requested by the patient (or if unable, by the patient's family).  Yes   Patient/family informed of their freedom to choose among providers that offer the needed level of care, that participate in Medicare, Medicaid or managed care program needed by the patient, have an available bed and are willing to accept the patient.  Yes   Patient/family informed of [facility]'s ownership interest in [facility] and [facility], as well as of the fact that they are under no obligation to receive care at these facilities.  PASRR submitted to EDS on 03/16/17     PASRR number received on       Existing PASRR number confirmed on 03/16/17     FL2 transmitted to all facilities in geographic area requested by pt/family on 03/16/17     FL2 transmitted to all facilities within larger geographic area on       Patient informed that his/her managed care company has contracts with or will negotiate with certain facilities, including the following:        Yes   Patient/family informed of bed offers received.  Patient chooses bed at [facility]     Physician recommends and patient chooses bed at      Patient to be transferred to [facility] on  .  Patient to be transferred to facility by [facility]     Patient family notified on   of transfer.  Name of family member notified:        PHYSICIAN       Additional Comment:    _______________________________________________ Margarito Liner,  LCSW 03/17/2017, 1:44 PM

## 2017-03-17 NOTE — Clinical Social Work Note (Signed)
Clinical Social Work Assessment  Patient Details  Name: Jean Moore MRN: [id number] Date of Birth: Feb 15, 1926  Date of referral:  03/17/17               Reason for consult:  Facility Placement, Discharge Planning                Permission sought to share information with:  Facility Sport and exercise psychologist, Family Supports Permission granted to share information::  Yes, Verbal Permission Granted  Name::        Agency::  SNF's  Relationship::  Sons at bedside  Contact Information:     Housing/Transportation Living arrangements for the past 2 months:  Single Family Home Source of Information:  Patient, Medical Team, Adult Children Patient Interpreter Needed:  None Criminal Activity/Legal Involvement Pertinent to Current Situation/Hospitalization:  No - Comment as needed Significant Relationships:  Adult Children Lives with:  Adult Children Do you feel safe going back to the place where you live?  Yes Need for family participation in patient care:  Yes (Comment)  Care giving concerns:  PT recommending SNF once medically stable for discharge.   Social Worker assessment / plan:  CSW met with patient. Two sons at bedside. CSW introduced role and explained that PT recommendations would be discussed. Patient and her sons confirmed that [facility] is first preference. They do not have a bed available. [facility] is no longer a preference. [facility] was next preference and then [facility] is full until Monday. Patient and her son agreeable to [facility]. They do have a bed today and are starting authorization. No further concerns. CSW encouraged patient and her sons to contact CSW as needed. CSW will continue to follow patient and her children for support and facilitate discharge to SNF once auth obtained.  Employment status:  Retired Nurse, adult PT Recommendations:  [place] / Referral to community resources:   [place]  Patient/Family's Response to care:  Patient and her sons agreeable to SNF placement. Patient's children supportive and involved in patient's care. Patient and her sons appreciated social work intervention.  Patient/Family's Understanding of and Emotional Response to Diagnosis, Current Treatment, and Prognosis:  Patient and her sons have a good understanding of the reason for admission and her need for rehab prior to returning home. Patient and her sons appear happy with hospital care.  Emotional Assessment Appearance:  Appears stated age Attitude/Demeanor/Rapport:  Engaged, Gracious Affect (typically observed):  Accepting, Appropriate, Pleasant, Calm Orientation:  Oriented to Self, Oriented to Place, Oriented to  Time, Oriented to Situation Alcohol / Substance use:  Never Used Psych involvement (Current and /or in the community):  No (Comment)  Discharge Needs  Concerns to be addressed:  Care Coordination Readmission within the last 30 days:  No Current discharge risk:  Dependent with Mobility Barriers to Discharge:  [place], LCSW 03/17/2017, 1:40 PM

## 2017-03-17 NOTE — Progress Notes (Signed)
Nutrition Follow-up  DOCUMENTATION CODES:   Non-severe (moderate) malnutrition in context of chronic illness  INTERVENTION:   Ensure Enlive po BID, each supplement provides 350 kcal and 20 grams of protein  Will continue to monitor potassium levels  Contact diabetes coordinator in regards to patient's serum glucose levels  NUTRITION DIAGNOSIS:   Moderate Malnutrition related to chronic illness(CHF/advanced aging) as evidenced by moderate fat depletion, moderate muscle depletion.  GOAL:   Patient will meet greater than or equal to 90% of their needs  Progressing  MONITOR:   PO intake, Supplement acceptance, Labs, Weight trends, Skin, I & O's  ASSESSMENT:   ANDI ORDERS is a 84 y.o. female with a hx of chronic systolic HF who is being seen today for the evaluation of acute on chronic systolic CHF at the request of Dr August Saucer   Pt reports a good appetite but the food choices and taste of foods negatively impact her intake. Meal completion records indicate pt is consuming an average of 50% meals at this time.  Pt requests saltine crackers with peanut butter and Ensure at time of visit. Contacted diabetes coordinator in regards to pt's glucose levels. Palliative Care is following patient.    RD endorsed adequate calorie and protein consumption. Pt at risk for greater degree of malnutrition if intake does not improve. Also, some depletions may be masked given lower extremity edema.   Labs reviewed; Na 132, K 5.3, BUN 57, Albumin 2.8 (2/4), serum glucose levels 146-398 Medications reviewed; PO Lasix   NUTRITION - FOCUSED PHYSICAL EXAM:    Most Recent Value  Orbital Region  Moderate depletion  Upper Arm Region  Moderate depletion  Thoracic and Lumbar Region  No depletion  Buccal Region  Moderate depletion  Temple Region  Severe depletion  Clavicle Bone Region  Moderate depletion  Clavicle and Acromion Bone Region  Moderate depletion  Scapular Bone Region  Unable to assess   Dorsal Hand  Unable to assess  Patellar Region  No depletion  Anterior Thigh Region  No depletion  Posterior Calf Region  No depletion  Edema (RD Assessment)  Moderate      Diet Order:  Diet Heart Room service appropriate? Yes; Fluid consistency: Thin  EDUCATION NEEDS:   No education needs have been identified at this time  Skin:  Skin Assessment: Skin Integrity Issues: Skin Integrity Issues:: Diabetic Ulcer Diabetic Ulcer: rt foot, lt foot  Last BM:  03/14/17  Height:   Ht Readings from Last 1 Encounters:  03/14/17 5\' 3"  (1.6 m)   Weight:   Wt Readings from Last 1 Encounters:  03/17/17 149 lb (67.6 kg)   Ideal Body Weight:  52.3 kg  BMI:  Body mass index is 26.39 kg/m.  Estimated Nutritional Needs:   Kcal:  1650-1850  Protein:  70-85 grams  Fluid:  1.6-1.8 L  Fransisca Kaufmann, MS, RDN, LDN 03/17/2017 11:56 AM

## 2017-03-17 NOTE — Clinical Social Work Note (Addendum)
[facility], [facility], and [facility] are all full today. [facility] does have a bed available today. CSW left message for admissions coordinator asking her to start insurance authorization.  Charlynn Court, CSW [phone]  2:16 pm Authorization approved for discharge to SNF today. CSW paged MD to notify.  Charlynn Court, CSW [phone]

## 2017-03-17 NOTE — Clinical Social Work Placement (Signed)
   CLINICAL SOCIAL WORK PLACEMENT  NOTE  Date:  03/17/2017  Patient Details  Name: Jean Moore MRN: [id number] Date of Birth: [birth year]  Clinical Social Work is seeking post-discharge placement for this patient at the Skilled  Nursing Facility level of care (*CSW will initial, date and re-position this form in  chart as items are completed):  Yes   Patient/family provided with [place] Clinical Social Work Department's list of facilities offering this level of care within the geographic area requested by the patient (or if unable, by the patient's family).  Yes   Patient/family informed of their freedom to choose among providers that offer the needed level of care, that participate in Medicare, Medicaid or managed care program needed by the patient, have an available bed and are willing to accept the patient.  Yes   Patient/family informed of [facility]'s ownership interest in [facility] and [facility], as well as of the fact that they are under no obligation to receive care at these facilities.  PASRR submitted to EDS on 03/16/17     PASRR number received on       Existing PASRR number confirmed on 03/16/17     FL2 transmitted to all facilities in geographic area requested by pt/family on 03/16/17     FL2 transmitted to all facilities within larger geographic area on       Patient informed that his/her managed care company has contracts with or will negotiate with certain facilities, including the following:        Yes   Patient/family informed of bed offers received.  Patient chooses bed at [facility]     Physician recommends and patient chooses bed at      Patient to be transferred to [facility] on 03/17/17.  Patient to be transferred to facility by [facility]     Patient family notified on 03/17/17 of transfer.  Name of family member notified:  Son and daughter     PHYSICIAN Please prepare prescriptions     Additional Comment:     _______________________________________________ Margarito Liner, LCSW 03/17/2017, 4:16 PM

## 2017-03-30 ENCOUNTER — Encounter (HOSPITAL_COMMUNITY): Payer: Medicare Other

## 2017-03-31 ENCOUNTER — Ambulatory Visit: Payer: Medicare Other | Admitting: Vascular Surgery

## 2017-04-07 DEATH — deceased

## 2017-04-12 ENCOUNTER — Encounter: Payer: Medicare Other | Admitting: Internal Medicine

## 2017-09-19 IMAGING — DX DG CHEST 2V
2 series · 2 of 2 positions shown · non-contrast
Comparison: 04/26/2013

CLINICAL DATA: Cough and congestion for a few days now - r/o
bronchitis or PNA - hx of AFIB, CHF, HTN

EXAM:
CHEST  2 VIEW

[chest lat]
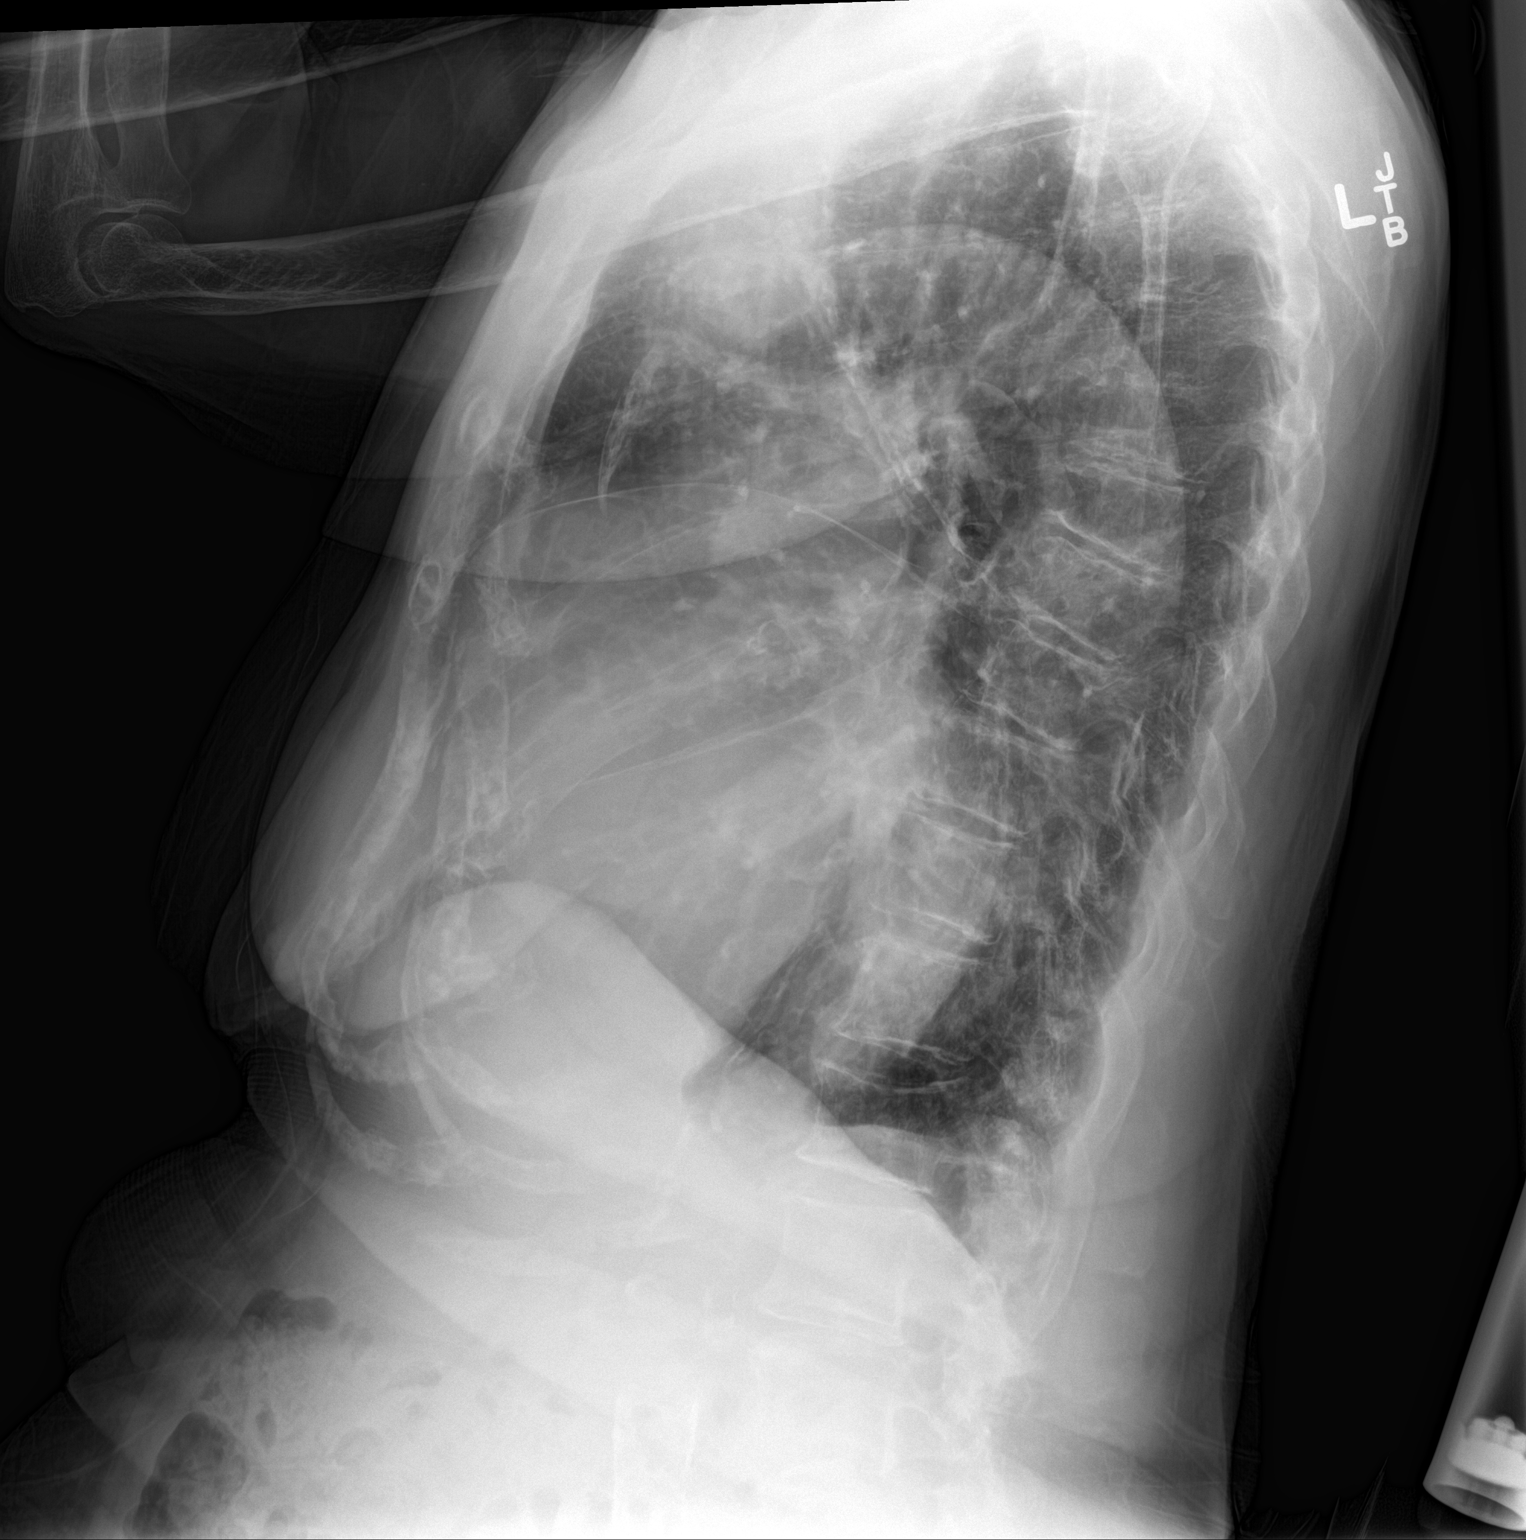

[chest ap]
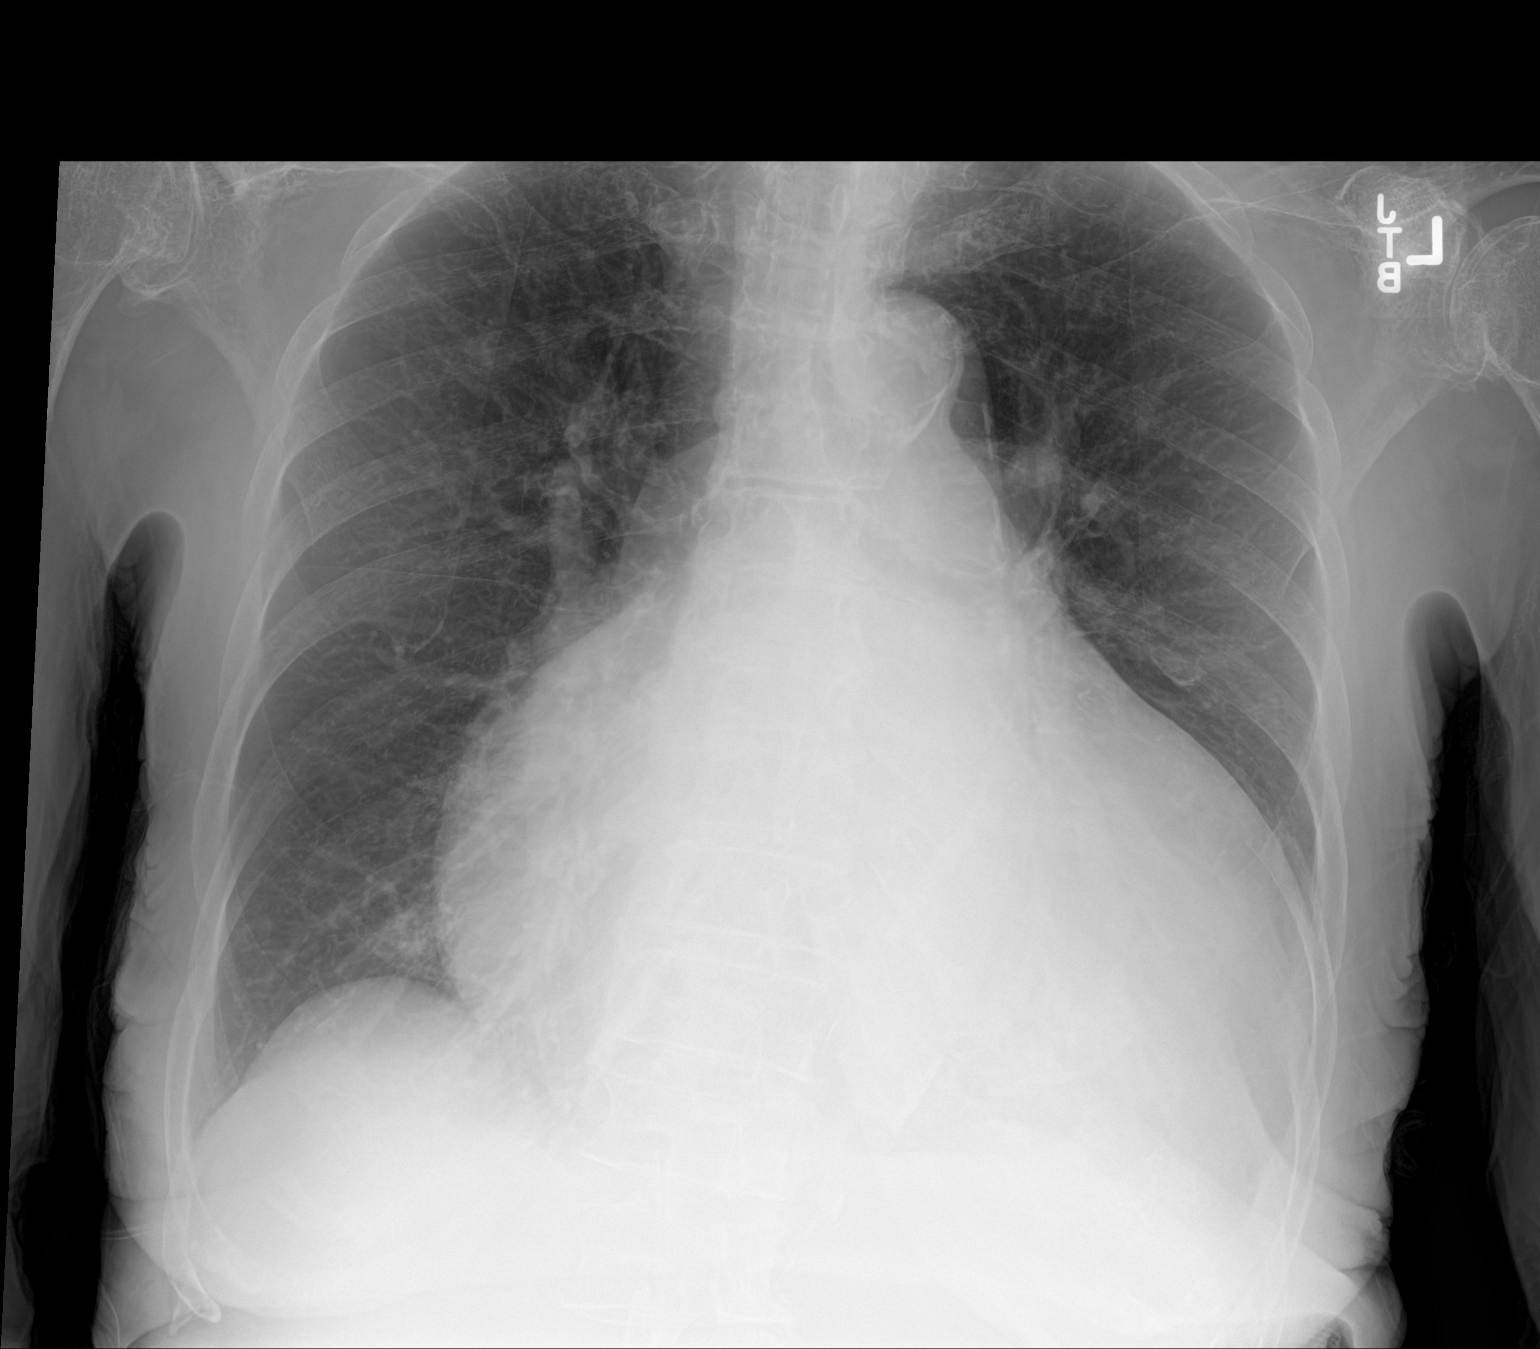

[2 of 2 positions shown; findings below may reference images not displayed]

FINDINGS: Marked enlargement of the cardiopericardial silhouette, which
appears mildly increased when compared the prior study.

No mediastinal or hilar masses or convincing adenopathy.

Lungs are clear.  No pleural effusion or pneumothorax.

Bony thorax is demineralized. Slight wedge-shaped deformity of a mid
thoracic vertebra. Slight depression of the upper endplate of what
appears to be L1. These findings are stable.
IMPRESSION: 1. Marked enlargement of the cardiopericardial silhouette, mildly
increased from the prior study. Consider the possibility of a
pericardial effusion.
2. No acute findings in the lungs.

## 2019-05-21 IMAGING — DX DG CHEST 2V
2 series · 2 of 2 positions shown · non-contrast
Comparison: 04/14/2015 CXR

CLINICAL DATA: Episodes of dyspnea for unspecified amount of time.

EXAM:
CHEST  2 VIEW

[x chest ap]
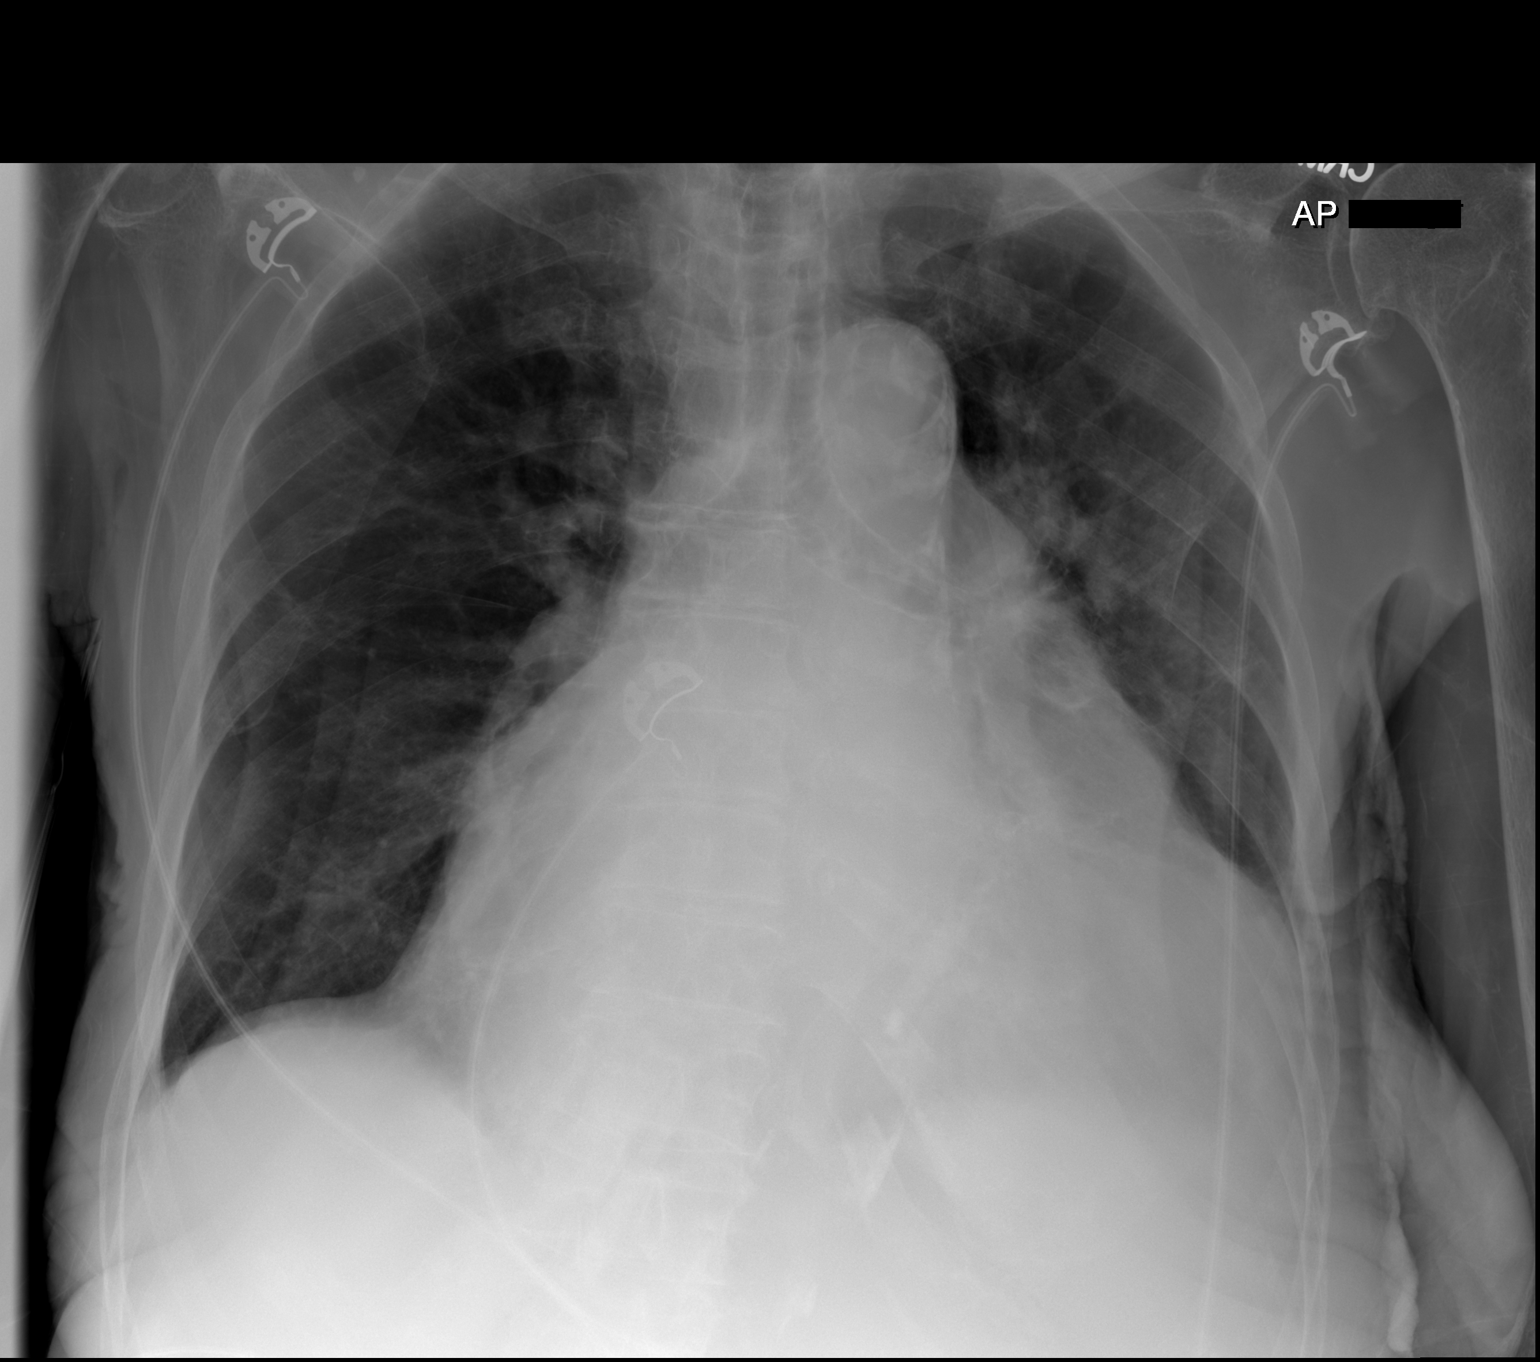

[w chest lat]
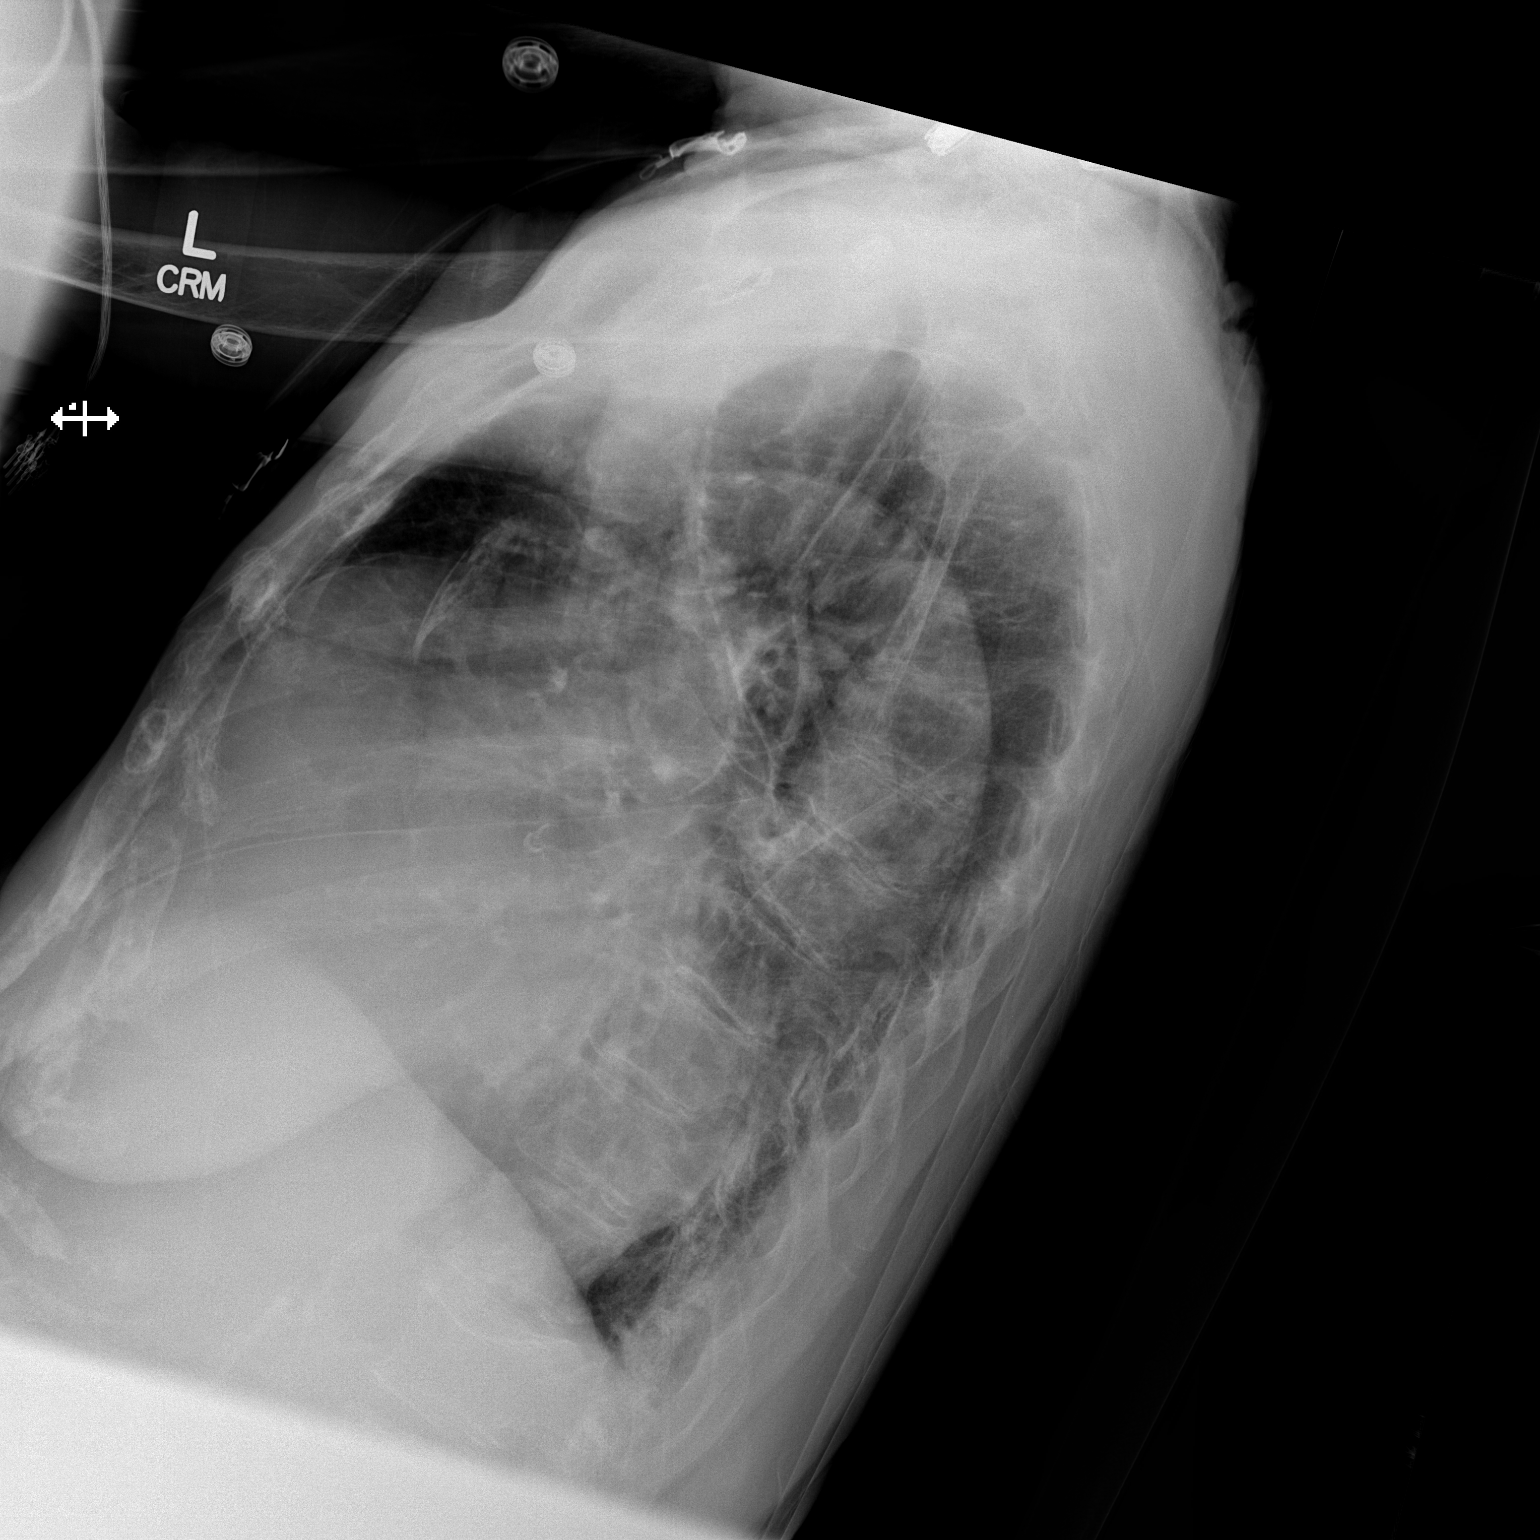

[2 of 2 positions shown; findings below may reference images not displayed]

FINDINGS: Chronic stable enlargement of the cardiac silhouette. Moderate
aortic atherosclerosis without aneurysm. Slight interval increase in
airspace opacities with central vascular congestion consistent CHF.
No significant pleural effusions. Thoracic spondylosis with chronic
mild wedge deformity of a midthoracic vertebral body and superior
endplate compression of L1.
IMPRESSION: Stable enlargement of the cardiac silhouette with aortic
atherosclerosis.

Central vascular congestion with faint airspace opacities consistent
with mild pulmonary edema, slightly more prominent than on prior.
